# Patient Record
Sex: Male | Born: 1940 | Race: White | Hispanic: No | Marital: Married | Smoking: Former smoker
Health system: Southern US, Community
[De-identification: ages and names within clinical notes are randomized; demographics above are authoritative.]

## PROBLEM LIST (undated history)

## (undated) DIAGNOSIS — J449 Chronic obstructive pulmonary disease, unspecified: Secondary | ICD-10-CM

## (undated) DIAGNOSIS — J302 Other seasonal allergic rhinitis: Secondary | ICD-10-CM

## (undated) DIAGNOSIS — E785 Hyperlipidemia, unspecified: Secondary | ICD-10-CM

## (undated) DIAGNOSIS — I10 Essential (primary) hypertension: Secondary | ICD-10-CM

## (undated) DIAGNOSIS — I350 Nonrheumatic aortic (valve) stenosis: Secondary | ICD-10-CM

## (undated) DIAGNOSIS — E119 Type 2 diabetes mellitus without complications: Secondary | ICD-10-CM

## (undated) DIAGNOSIS — M1990 Unspecified osteoarthritis, unspecified site: Secondary | ICD-10-CM

## (undated) DIAGNOSIS — Z77090 Contact with and (suspected) exposure to asbestos: Secondary | ICD-10-CM

## (undated) DIAGNOSIS — Z8719 Personal history of other diseases of the digestive system: Secondary | ICD-10-CM

## (undated) DIAGNOSIS — J45909 Unspecified asthma, uncomplicated: Secondary | ICD-10-CM

## (undated) DIAGNOSIS — J984 Other disorders of lung: Secondary | ICD-10-CM

## (undated) HISTORY — DX: Unspecified asthma, uncomplicated: J45.909

## (undated) HISTORY — DX: Other disorders of lung: J98.4

## (undated) HISTORY — DX: Other seasonal allergic rhinitis: J30.2

## (undated) HISTORY — PX: APPENDECTOMY: SHX54

## (undated) HISTORY — DX: Unspecified osteoarthritis, unspecified site: M19.90

## (undated) HISTORY — DX: Type 2 diabetes mellitus without complications: E11.9

## (undated) HISTORY — DX: Chronic obstructive pulmonary disease, unspecified: J44.9

## (undated) HISTORY — DX: Essential (primary) hypertension: I10

## (undated) HISTORY — PX: TONSILLECTOMY: SUR1361

## (undated) HISTORY — DX: Nonrheumatic aortic (valve) stenosis: I35.0

## (undated) HISTORY — DX: Personal history of other diseases of the digestive system: Z87.19

## (undated) HISTORY — DX: Hyperlipidemia, unspecified: E78.5

---

## 2010-07-09 HISTORY — PX: CATARACT EXTRACTION: SUR2

## 2012-01-23 ENCOUNTER — Ambulatory Visit: Payer: Self-pay | Admitting: Internal Medicine

## 2012-02-22 ENCOUNTER — Ambulatory Visit (INDEPENDENT_AMBULATORY_CARE_PROVIDER_SITE_OTHER): Payer: Medicare Other | Admitting: Internal Medicine

## 2012-02-22 ENCOUNTER — Encounter: Payer: Self-pay | Admitting: Internal Medicine

## 2012-02-22 VITALS — BP 140/70 | HR 70 | Temp 98.0°F | Resp 16 | Ht 65.75 in | Wt 211.0 lb

## 2012-02-22 DIAGNOSIS — J439 Emphysema, unspecified: Secondary | ICD-10-CM

## 2012-02-22 DIAGNOSIS — J438 Other emphysema: Secondary | ICD-10-CM

## 2012-02-22 DIAGNOSIS — E119 Type 2 diabetes mellitus without complications: Secondary | ICD-10-CM

## 2012-02-22 NOTE — Progress Notes (Signed)
Patient ID: Anthony Turner, male   DOB: 01/07/1941, 71 y.o.   MRN: [id number]  Patient Active Problem List  Diagnosis  . COPD (chronic obstructive pulmonary disease) with emphysema  . Diabetes mellitus type 2, diet-controlled    Subjective:  CC:   Chief Complaint  Patient presents with  . New patient    HPI:   Anthony Turner is a 71 y.o. male who presents as a new patient to establish primary care with the chief complaint of  Dyspnea.  He has a history of COPD and asbestosis, and is scheduled to see the pulmonologist at the [place] in [place] next week.  He has had no prior PFTS per patient.   He Uses scheduled bronchodilators including Spiriva  But is not using any long-acting antagonist because the [place] does not cover this medication. He has an episode of bronchitis annually in the fall  when vacations in [place]. He's not interested in giving up his vacation.   2) Elevated PSA  To 8 recently,  Saw  urologist  Yesterday,  Given abx for 3 weeks,  Repeat  PSA is planned.  3) diet controlled DM.  4) Heart murmur since childhood,  ECHO done recently showing aortic stenosis, mild.   Past Medical History  Diagnosis Date  . COPD (chronic obstructive pulmonary disease)   . Diabetes mellitus    . Family History  Problem Relation Age of Onset  . Diabetes Mother   . Heart disease Mother      History   Social History  . Marital Status: Married    Spouse Name: N/A    Number of Children: N/A  . Years of Education: N/A   Occupational History  . Not on file.   Social History Main Topics  . Smoking status: Former Smoker -- 35 years    Quit date: 02/21/1982  . Smokeless tobacco: Never Used  . Alcohol Use: No  . Drug Use: No  . Sexually Active: Not on file   Other Topics Concern  . Not on file   Social History Narrative  . No narrative on file    Allergies not on file  Review of Systems:   The remainder of the review of systems was negative except those  addressed in the HPI.       Objective:  BP 140/70  Pulse 70  Temp 98 F (36.7 C) (Oral)  Resp 16  Ht 5' 5.75" (1.67 m)  Wt 211 lb (95.709 kg)  BMI 34.32 kg/m2  SpO2 96%  General appearance: alert, cooperative and appears stated age Ears: normal TM's and external ear canals both ears Throat: lips, mucosa, and tongue normal; teeth and gums normal Neck: no adenopathy, no carotid bruit, supple, symmetrical, trachea midline and thyroid not enlarged, symmetric, no tenderness/mass/nodules Back: symmetric, no curvature. ROM normal. No CVA tenderness. Lungs: clear to auscultation bilaterally Heart: regular rate and rhythm, S1, S2 normal, no murmur, click, rub or gallop Abdomen: soft, non-tender; bowel sounds normal; no masses,  no organomegaly Pulses: 2+ and symmetric Skin: Skin color, texture, turgor normal. No rashes or lesions Lymph nodes: Cervical, supraclavicular, and axillary nodes normal.  Assessment and Plan:  COPD (chronic obstructive pulmonary disease) with emphysema His pulmonary function is unclear. He has a history of both tobacco abuse and asbestosis so he may have mixed restrictive and obstructive disease with no prior history of PFTs. I am offering local pulmonology followup. However [facility] from the [place] to the [place]. I'm reluctant to add to his monthly overhead with a non-reimbursable specialist visit so we'll await records from the [facility].  Diabetes mellitus type 2, diet-controlled He is scheduled for labs in the near future at the [facility]. I have requested copy of those. I reviewed the low glycemic index diet with him and his wife and shown how part count carbohydrates and limit the unnecessary starches in his diet. Daily exercise also recommended pulmonary function permitting. He is up-to-date on eye exams. He will be continuing to see most of his specialist care at [facility] for  an episode of a fall. He is taking a statin for goal LDL less than 70, and he is on an ARB.   Updated Medication List Outpatient Encounter Prescriptions as of 02/22/2012  Medication Sig Dispense Refill  . albuterol (PROVENTIL HFA;VENTOLIN HFA) 108 (90 BASE) MCG/ACT inhaler Inhale 2 puffs into the lungs 3 (three) times daily.      Marland Kitchen amLODipine (NORVASC) 5 MG tablet Take 2.5 mg by mouth daily.      Marland Kitchen aspirin 81 MG tablet Take 81 mg by mouth daily.      Tery Sanfilippo Calcium (STOOL SOFTENER PO) Take by mouth.      . hydrochlorothiazide (HYDRODIURIL) 25 MG tablet Take 25 mg by mouth daily.      Marland Kitchen losartan (COZAAR) 100 MG tablet Take 100 mg by mouth daily.      . mometasone (ASMANEX) 220 MCG/INH inhaler Inhale 2 puffs into the lungs daily.      . potassium chloride SA (K-DUR,KLOR-CON) 20 MEQ tablet Take 20 mEq by mouth daily.      . pravastatin (PRAVACHOL) 80 MG tablet Take 80 mg by mouth daily.      Marland Kitchen tiotropium (SPIRIVA) 18 MCG inhalation capsule Place 18 mcg into inhaler and inhale daily.         Orders Placed This Encounter  Procedures  . HM COLONOSCOPY    No Follow-up on file.

## 2012-02-22 NOTE — Patient Instructions (Addendum)
[facility] TdaP  And Zostavax vaccine  ; or you can get it at the [facility]   Please ask [facility] to send copies of blood test,  X rays, etc   Consider a Low Glycemic Index Diet and eating 6 smaller meals daily .  This frequent feeding stimulates your metabolism and the lower glycemic index foods will lower your blood sugars:   This is an example of my daily  "Low GI"  Diet:  All of the foods can be found at grocery stores and in bulk at [employer]  club   7 AM Breakfast:  Low carbohydrate Protein  Shakes (I recommend the E[employer]vant[employer] "Carb Control" shakes  Or the low carb shakes by Atkins.   Both are available everywhere:  In  cases at [employer]  Or in 4 packs at grocery stores and pharmacies  2.5 carbs  (Alternative is  a toasted Arnold's Sandwhich Thin w/ peanut butter, a "Bagel Thin" with cream cheese and salmon) or  a scrambled egg burrito made with a low carb tortilla .  Avoid cereal and bananas, oatmeal too unless the old fashioned kind that takes 30-40 minutes to prepare.  the rest is overly processed, has minimal fiber, and loaded with carbohydrates!   10 AM: Protein bar by Atkins (the snack size, under 200 cal.  There are many varieties , available widely again or in bulk in limited varieties at [employer])  Other so called "protein bars" tend to be loaded with carbohydrates.  Remember, in food advertising, the word "energy" is synonymous for " carbohydrate."  Lunch: sandwich of [place], (or any lunchmeat or canned tuna), fresh avocado and cheese on a lower carbohydrate pita bread, flatbread, or tortilla . Ok to use mayonnaise. The bread is the only source or carbohydrate that can be decreased (Joseph's makes a pita bread and a flat bread  Are 50 cal and 4 net carbs ; Toufayan makes a low carb flatbread 100 cal and 9 net carbs  and  Mission makes a low carb whole wheat tortilla  210 cal and 6 net carbs)  3 PM:  Mid day :  Another protein bar,  Or a  cheese stick (100 cal, 0 carbs),  Or 1 ounce  of  almonds, walnuts, pistachios, pecans, peanuts,  Macadamia nuts. Or a Dannon light n Fit greek yogurt, 80 cal 8 net carbs . Avoid "granola"; the dried cranberries and raisins are loaded with carbohydrates.    6 PM  Dinner:  "mean and green:"  Meat/chicken/fish or a high protein legume; , with a green salad, and a low GI  Veggie (broccoli, cauliflower, green beans, spinach, brussel sprouts. Lima beans) : Avoid "Low fat dressings, Reyne Dumas and [address]! They are loaded with sugar! Instead use ranch, vinagrette,  Blue cheese, etc  9 PM snack : Breyer's "low carb" fudgsicle or  ice cream bar (Carb Smart line), or  Weight Watcher's ice cream bar , or anouther "no sugar added" ice cream; or another protein shake or a serving of fresh fruit with whipped cream (Avoid bananas, pineapple, grapes  and watermelon on a regular basis because they are high in sugar)   Remember that snack Substitutions should be less than 15 to 20 carbs  Per serving. Remember to subtract fiber grams to get the "net carbs."

## 2012-02-24 ENCOUNTER — Encounter: Payer: Self-pay | Admitting: Internal Medicine

## 2012-02-24 DIAGNOSIS — E119 Type 2 diabetes mellitus without complications: Secondary | ICD-10-CM | POA: Insufficient documentation

## 2012-02-24 DIAGNOSIS — J449 Chronic obstructive pulmonary disease, unspecified: Secondary | ICD-10-CM | POA: Insufficient documentation

## 2012-02-24 DIAGNOSIS — J439 Emphysema, unspecified: Secondary | ICD-10-CM | POA: Insufficient documentation

## 2012-02-24 NOTE — Assessment & Plan Note (Signed)
His pulmonary function is unclear. He has a history of both tobacco abuse and asbestosis so he may have mixed restrictive and obstructive disease with no prior history of PFTs. I am offering local pulmonology followup. However he gets most of his care at the [place] and is interested in transferring from the [place] to the [facility]. I'm reluctant to add to his monthly overhead with a non-reimbursable specialist visit so we'll await records from the [facility].

## 2012-02-24 NOTE — Assessment & Plan Note (Addendum)
He is scheduled for labs in the near future at the [facility]. I have requested copy of those. I reviewed the low glycemic index diet with him and his wife and shown how part count carbohydrates and limit the unnecessary starches in his diet. Daily exercise also recommended pulmonary function permitting. He is up-to-date on eye exams. He will be continuing to see most of his specialist care at the [facility] at the [facility] for an episode of a fall. He is taking a statin for goal LDL less than 70, and he is on an ARB.

## 2012-06-08 LAB — HM DIABETES EYE EXAM

## 2012-06-25 ENCOUNTER — Ambulatory Visit: Payer: Medicare Other | Admitting: Internal Medicine

## 2012-08-23 ENCOUNTER — Other Ambulatory Visit: Payer: Self-pay

## 2013-02-11 ENCOUNTER — Other Ambulatory Visit: Payer: Self-pay

## 2013-03-03 LAB — PSA: PSA: 11.6

## 2013-03-03 LAB — COMPREHENSIVE METABOLIC PANEL
Glucose: 124
Sodium: 140 mmol/L (ref 137–147)

## 2013-03-03 LAB — HEMOGLOBIN A1C: A1c: 6.6

## 2013-03-03 LAB — CBC
HGB: 13.9 g/dL
WBC: 11.16
platelet count: 233

## 2013-04-21 ENCOUNTER — Encounter: Payer: Self-pay | Admitting: Family Medicine

## 2013-04-21 ENCOUNTER — Ambulatory Visit (INDEPENDENT_AMBULATORY_CARE_PROVIDER_SITE_OTHER): Payer: Medicare Other | Admitting: Family Medicine

## 2013-04-21 VITALS — BP 116/68 | HR 84 | Temp 98.2°F | Ht 66.0 in | Wt 215.0 lb

## 2013-04-21 DIAGNOSIS — J302 Other seasonal allergic rhinitis: Secondary | ICD-10-CM | POA: Insufficient documentation

## 2013-04-21 DIAGNOSIS — I10 Essential (primary) hypertension: Secondary | ICD-10-CM

## 2013-04-21 DIAGNOSIS — J438 Other emphysema: Secondary | ICD-10-CM

## 2013-04-21 DIAGNOSIS — I350 Nonrheumatic aortic (valve) stenosis: Secondary | ICD-10-CM | POA: Insufficient documentation

## 2013-04-21 DIAGNOSIS — M1990 Unspecified osteoarthritis, unspecified site: Secondary | ICD-10-CM | POA: Insufficient documentation

## 2013-04-21 DIAGNOSIS — J439 Emphysema, unspecified: Secondary | ICD-10-CM

## 2013-04-21 DIAGNOSIS — J309 Allergic rhinitis, unspecified: Secondary | ICD-10-CM

## 2013-04-21 DIAGNOSIS — M129 Arthropathy, unspecified: Secondary | ICD-10-CM

## 2013-04-21 DIAGNOSIS — R972 Elevated prostate specific antigen [PSA]: Secondary | ICD-10-CM

## 2013-04-21 DIAGNOSIS — I359 Nonrheumatic aortic valve disorder, unspecified: Secondary | ICD-10-CM

## 2013-04-21 DIAGNOSIS — E119 Type 2 diabetes mellitus without complications: Secondary | ICD-10-CM

## 2013-04-21 DIAGNOSIS — E785 Hyperlipidemia, unspecified: Secondary | ICD-10-CM

## 2013-04-21 NOTE — Assessment & Plan Note (Addendum)
Brings records showing PSA level up to 11.6 (02/2013). Reviewed implications of this reading with patient (including BPH, prostatitis, and cancer) - and how we would need prostate biopsy to rule out cancer. Discussed possibility of aggressive prostate cancer that could be life threatening and recommended continue workup with urology referral and prostate biospy. Pt prefers to monitor for now. Will recheck PSA when returns for medicare wellness visit in 3 months.

## 2013-04-21 NOTE — Progress Notes (Signed)
Subjective:    Patient ID: Anthony Turner, male    DOB: 1941/02/09, 72 y.o.   MRN: [id number]  HPI CC: transfer of care from Dr. Darrick Huntsman  Had trouble getting in to see prior PCP so decided to transfer care here.  COPD - compliant with asmanex and spiriva.  Takes albuterol as needed, intermittently.  Notes recently worsening dyspnea with exertion max able to walk for 100 yards.  H/o asbestosis. Recently seen at urgent care - treated with steroid and abx.  Wife gave him lasix which also seemed to help.  Diet controlled diabetes - last a1c 6.6%.  Last eye exam was 06/2012.  Foot exam today.  Completed pneumovax.  Flu shot today.  Recently found to have elevated prostate level.  Declines prostate biopsy "I don't think I'll die from prostate cancer, I'm more worried about my breathing".  No prostate infection sxs like dysuria, urgency, abd pain, lower back pain, or fevers/chills.  Lives with wife, 3 dogs Occupation: retired, was Archivist Edu: [employer] Activity: no regular exercise Diet: good water, fruits/vegetables daily  Preventative: Colonoscopy - declines.  Trouble with this in the past 2/2 unable to pass scope Prostate cancer screening - elevate PSA - has declined further eval in past. Flu shot today Pneumovax 2008 zostavax - will check with insurance  Medications and allergies reviewed and updated in chart.  Past histories reviewed and updated if relevant as below. Patient Active Problem List   Diagnosis Date Noted  . COPD (chronic obstructive pulmonary disease) with emphysema 02/24/2012  . Diabetes mellitus type 2, diet-controlled 02/24/2012  . COPD (chronic obstructive pulmonary disease)   . Diabetes mellitus    Past Medical History  Diagnosis Date  . COPD (chronic obstructive pulmonary disease)   . Type 2 diabetes mellitus, controlled   . Asthma   . Arthritis   . Diverticulitis   . Seasonal allergic rhinitis   . Aortic stenosis     mod by last echo  . HTN  (hypertension)   . HLD (hyperlipidemia)    Past Surgical History  Procedure Laterality Date  . Appendectomy  ? 1956  . Tonsillectomy  ? 1944  . Cataract extraction Bilateral 2012   History  Substance Use Topics  . Smoking status: Former Smoker -- 4.00 packs/day for 35 years    Types: Cigarettes    Quit date: 02/21/1982  . Smokeless tobacco: Never Used  . Alcohol Use: Yes     Comment: Occasional   Family History  Problem Relation Age of Onset  . Alcohol abuse Father    Allergies  Allergen Reactions  . Sulfa Antibiotics Nausea Only   Current Outpatient Prescriptions on File Prior to Visit  Medication Sig Dispense Refill  . albuterol (PROVENTIL HFA;VENTOLIN HFA) 108 (90 BASE) MCG/ACT inhaler Inhale 2 puffs into the lungs 3 (three) times daily.      Marland Kitchen amLODipine (NORVASC) 5 MG tablet Take 5 mg by mouth daily.       Marland Kitchen aspirin 81 MG tablet Take 81 mg by mouth daily.      Tery Sanfilippo Calcium (STOOL SOFTENER PO) Take by mouth.      . hydrochlorothiazide (HYDRODIURIL) 25 MG tablet Take 25 mg by mouth daily.      Marland Kitchen losartan (COZAAR) 100 MG tablet Take 100 mg by mouth daily.      . mometasone (ASMANEX) 220 MCG/INH inhaler Inhale 2 puffs into the lungs daily.      . potassium chloride SA (K-DUR,KLOR-CON) 20 MEQ tablet  Take 20 mEq by mouth daily.      . pravastatin (PRAVACHOL) 80 MG tablet Take 80 mg by mouth daily.      Marland Kitchen tiotropium (SPIRIVA) 18 MCG inhalation capsule Place 18 mcg into inhaler and inhale daily.       No current facility-administered medications on file prior to visit.      Review of Systems  Constitutional: Negative for fever, chills, activity change, appetite change, fatigue and unexpected weight change.  HENT: Negative for hearing loss.   Eyes: Negative for visual disturbance.  Respiratory: Positive for cough, chest tightness, shortness of breath and wheezing.   Cardiovascular: Negative for chest pain, palpitations and leg swelling.  Gastrointestinal: Negative  for nausea, vomiting, abdominal pain, diarrhea, constipation, blood in stool and abdominal distention.  Genitourinary: Negative for hematuria and difficulty urinating.  Musculoskeletal: Negative for arthralgias, myalgias and neck pain.  Skin: Negative for rash.  Neurological: Negative for dizziness, seizures, syncope and headaches.  Hematological: Negative for adenopathy. Does not bruise/bleed easily.  Psychiatric/Behavioral: Negative for dysphoric mood. The patient is not nervous/anxious.        Objective:   Physical Exam  Nursing note and vitals reviewed. Constitutional: He is oriented to person, place, and time. He appears well-developed and well-nourished. No distress.  HENT:  Head: Normocephalic and atraumatic.  Right Ear: External ear normal.  Left Ear: External ear normal.  Nose: Nose normal.  Mouth/Throat: Oropharynx is clear and moist. No oropharyngeal exudate.  Eyes: Conjunctivae and EOM are normal. Pupils are equal, round, and reactive to light. No scleral icterus.  Neck: Normal range of motion. Neck supple. Carotid bruit is not present.  Cardiovascular: Normal rate, regular rhythm, normal heart sounds and intact distal pulses.   No murmur heard. Pulses:      Radial pulses are 2+ on the right side, and 2+ on the left side.  Pulmonary/Chest: Effort normal and breath sounds normal. No respiratory distress. He has no wheezes. He has no rales.  Musculoskeletal: Normal range of motion. He exhibits no edema.  Diabetic foot exam: Normal inspection No skin breakdown, some thickening of heel soles bilaterally No calluses  Normal DP/PT pulses Normal sensation to light touch and monofilament Nails thickened  Lymphadenopathy:    He has no cervical adenopathy.  Neurological: He is alert and oriented to person, place, and time.  CN grossly intact, station and gait intact  Skin: Skin is warm and dry. No rash noted.  Psychiatric: He has a normal mood and affect. His behavior is  normal. Judgment and thought content normal.       Assessment & Plan:

## 2013-04-21 NOTE — Patient Instructions (Signed)
Flu shot today. Call your insurance about the shingles shot to see if it is covered or how much it would cost and where is cheaper (here or pharmacy).  If you want to receive here, call for nurse visit. Ask [facility] to [facility] visit to update your chart. Return at your convenience fasting for blood work and afterwads for [employer] visit.

## 2013-04-21 NOTE — Assessment & Plan Note (Signed)
Chronic, stable. Continue meds (amlodipine, hctz, losartan).

## 2013-04-21 NOTE — Assessment & Plan Note (Signed)
Chronic.  Remote but significant h/o smoking. Anticipate COPD with some component of asbestosis - will await records from [place] to review. rec continue meds as up to now.   May need further evaluation given marked dyspnea in setting of aortic stenosis, but will await records to review.

## 2013-04-21 NOTE — Assessment & Plan Note (Signed)
Seems well controlled as of latest A1c - 6.6% (02/2013). Continue low carb diet.  Foot exam today.

## 2013-04-21 NOTE — Assessment & Plan Note (Signed)
As of last echo by [place] - will await actual echocardiogram report - I asked pt to bring me copy of next office visit.Marland Kitchen

## 2013-04-21 NOTE — Assessment & Plan Note (Signed)
Chronic, stable. Continue pravastatin. Check FLP when returns fasting.

## 2013-04-30 ENCOUNTER — Encounter: Payer: Self-pay | Admitting: *Deleted

## 2013-05-14 ENCOUNTER — Other Ambulatory Visit: Payer: Self-pay

## 2013-06-18 ENCOUNTER — Telehealth: Payer: Self-pay | Admitting: Family Medicine

## 2013-06-18 DIAGNOSIS — E119 Type 2 diabetes mellitus without complications: Secondary | ICD-10-CM

## 2013-06-18 DIAGNOSIS — R972 Elevated prostate specific antigen [PSA]: Secondary | ICD-10-CM

## 2013-06-18 DIAGNOSIS — E559 Vitamin D deficiency, unspecified: Secondary | ICD-10-CM

## 2013-06-18 DIAGNOSIS — E538 Deficiency of other specified B group vitamins: Secondary | ICD-10-CM

## 2013-06-18 DIAGNOSIS — I10 Essential (primary) hypertension: Secondary | ICD-10-CM

## 2013-06-18 DIAGNOSIS — E785 Hyperlipidemia, unspecified: Secondary | ICD-10-CM

## 2013-06-18 NOTE — Telephone Encounter (Signed)
Patients wife notified

## 2013-06-18 NOTE — Telephone Encounter (Signed)
Notify labs ordered.  I think he's on the lab schedule for Tuesday, not Monday - plz verify with pt.

## 2013-06-18 NOTE — Telephone Encounter (Signed)
Pt is having labs done on Monday 06/22/13. [facility] so they want to make sure that he is getting his b12 level checked, also a vitamin D level, and a CMP to have his K checked, please call back to let them know

## 2013-06-22 LAB — HM DIABETES EYE EXAM

## 2013-06-23 ENCOUNTER — Other Ambulatory Visit (INDEPENDENT_AMBULATORY_CARE_PROVIDER_SITE_OTHER): Payer: Medicare Other

## 2013-06-23 DIAGNOSIS — R972 Elevated prostate specific antigen [PSA]: Secondary | ICD-10-CM

## 2013-06-23 DIAGNOSIS — E119 Type 2 diabetes mellitus without complications: Secondary | ICD-10-CM

## 2013-06-23 DIAGNOSIS — E538 Deficiency of other specified B group vitamins: Secondary | ICD-10-CM

## 2013-06-23 DIAGNOSIS — E785 Hyperlipidemia, unspecified: Secondary | ICD-10-CM

## 2013-06-23 DIAGNOSIS — E559 Vitamin D deficiency, unspecified: Secondary | ICD-10-CM

## 2013-06-23 LAB — PSA: PSA: 12.91 ng/mL — ABNORMAL HIGH (ref 0.10–4.00)

## 2013-06-23 LAB — COMPREHENSIVE METABOLIC PANEL
AST: 45 U/L — ABNORMAL HIGH (ref 0–37)
Alkaline Phosphatase: 81 U/L (ref 39–117)
BUN: 14 mg/dL (ref 6–23)
Creatinine, Ser: 1 mg/dL (ref 0.4–1.5)
GFR: 74.19 mL/min (ref >60.00)
Total Bilirubin: 0.5 mg/dL (ref 0.3–1.2)

## 2013-06-23 LAB — VITAMIN B12: Vitamin B-12: 751 pg/mL (ref 211–911)

## 2013-06-23 LAB — LIPID PANEL
Cholesterol: 152 mg/dL (ref 0–200)
LDL Cholesterol: 87 mg/dL (ref 0–99)
Total CHOL/HDL Ratio: 5
Triglycerides: 175 mg/dL — ABNORMAL HIGH (ref 0.0–149.0)
VLDL: 35 mg/dL (ref 0.0–40.0)

## 2013-06-28 ENCOUNTER — Encounter: Payer: Self-pay | Admitting: Family Medicine

## 2013-06-30 ENCOUNTER — Ambulatory Visit (INDEPENDENT_AMBULATORY_CARE_PROVIDER_SITE_OTHER): Payer: Medicare Other | Admitting: Family Medicine

## 2013-06-30 ENCOUNTER — Encounter: Payer: Self-pay | Admitting: Family Medicine

## 2013-06-30 VITALS — BP 134/76 | HR 80 | Temp 98.3°F | Ht 66.0 in | Wt 217.2 lb

## 2013-06-30 DIAGNOSIS — E785 Hyperlipidemia, unspecified: Secondary | ICD-10-CM

## 2013-06-30 DIAGNOSIS — R972 Elevated prostate specific antigen [PSA]: Secondary | ICD-10-CM

## 2013-06-30 DIAGNOSIS — Z0000 Encounter for general adult medical examination without abnormal findings: Secondary | ICD-10-CM

## 2013-06-30 DIAGNOSIS — J438 Other emphysema: Secondary | ICD-10-CM

## 2013-06-30 DIAGNOSIS — I10 Essential (primary) hypertension: Secondary | ICD-10-CM

## 2013-06-30 DIAGNOSIS — I350 Nonrheumatic aortic (valve) stenosis: Secondary | ICD-10-CM

## 2013-06-30 DIAGNOSIS — E119 Type 2 diabetes mellitus without complications: Secondary | ICD-10-CM

## 2013-06-30 DIAGNOSIS — Z1211 Encounter for screening for malignant neoplasm of colon: Secondary | ICD-10-CM

## 2013-06-30 DIAGNOSIS — J439 Emphysema, unspecified: Secondary | ICD-10-CM

## 2013-06-30 MED ORDER — BUDESONIDE-FORMOTEROL FUMARATE 160-4.5 MCG/ACT IN AERO: 2.0000 | INHALATION_SPRAY | Freq: Two times a day (BID) | RESPIRATORY_TRACT | Status: AC

## 2013-06-30 NOTE — Addendum Note (Signed)
Addended by: Eustaquio Boyden on: 06/30/2013 12:16 PM   Modules accepted: Level of Service

## 2013-06-30 NOTE — Assessment & Plan Note (Addendum)
Will continue to monitor. Wife state he has been taking lasix once a week.

## 2013-06-30 NOTE — Patient Instructions (Addendum)
Good to see you today, call us with questions Blood test to recheck prostate (free vs total) Stool kit today. I'd like you to return in 1 month to discuss breathing and re attempt spirometry.

## 2013-06-30 NOTE — Assessment & Plan Note (Addendum)
He has not been taking symbicort, only albuterol and spiriva.  Discussed starting symbicort and sample provided. H/o asbestos exposure. Still awaiting records from [place]. Reviewed meds with patient and wife, discussed triple controller med therapy and albuterol PRN. rtc 1 mo for f/u, spirometry

## 2013-06-30 NOTE — Progress Notes (Signed)
Subjective:    Patient ID: Anthony Turner, male    DOB: 1940/10/22, 72 y.o.   MRN: [id number]  HPI CC: medicare wellness visit  Med list he brings does not match AMV questionairre visit, which does not match our chart.  Will verify with wife.  Main concern is trouble breathing today.  Unable to fully mow lawn with push mower 2/2 shortness of breath.  Last bronchitis was last year.  H/o asthma nad COPD.  Has had difficulty completing spirometry in the past.  Short winded after 1 flight of stairs, has to stop and rest. Wife has started giving him lasix one pill daily.  Taking potassium one tablet daily with his lasix.  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   Denies falls , sadness , anhedonia or depression.  Preventative:  Colonoscopy - declines. Trouble with this in the past 2/2 unable to pass scope.  Unsure how long ago or who did this. Prostate cancer screening - elevate PSA - has declined further eval in past.  Does not want to undergo prostate evaluation until breathing is better. Flu shot 2014 Pneumovax 2008  zostavax - will check with insurance Advanced directives - healthcare proxy would be niece.  Lives with wife, 3 dogs  Occupation: retired, was Archivist  Edu: HS  Activity: no regular exercise  Diet: good water, fruits/vegetables daily   Medications and allergies reviewed and updated in chart.  Past histories reviewed and updated if relevant as below. Patient Active Problem List   Diagnosis Date Noted  . Elevated PSA 04/21/2013  . Arthritis   . Seasonal allergic rhinitis   . Aortic stenosis   . HTN (hypertension)   . HLD (hyperlipidemia)   . COPD (chronic obstructive pulmonary disease) with emphysema 02/24/2012  . Diabetes mellitus type 2, diet-controlled 02/24/2012   Past Medical History  Diagnosis Date  . COPD (chronic obstructive pulmonary disease)     . Type 2 diabetes mellitus, controlled   . Asthma   . Arthritis     with DIP nodules  . History of diverticulitis of colon   . Seasonal allergic rhinitis   . Aortic stenosis     mod by last echo (2014)  . HTN (hypertension)   . HLD (hyperlipidemia)    Past Surgical History  Procedure Laterality Date  . Appendectomy  ? 1956  . Tonsillectomy  ? 1944  . Cataract extraction Bilateral 2012   History  Substance Use Topics  . Smoking status: Former Smoker -- 4.00 packs/day for 35 years    Types: Cigarettes    Quit date: 02/21/1982  . Smokeless tobacco: Never Used  . Alcohol Use: Yes     Comment: Occasional   Family History  Problem Relation Age of Onset  . Alcohol abuse Father    Allergies  Allergen Reactions  . Sulfa Antibiotics Nausea Only   Current Outpatient Prescriptions on File Prior to Visit  Medication Sig Dispense Refill  . albuterol (PROVENTIL HFA;VENTOLIN HFA) 108 (90 BASE) MCG/ACT inhaler Inhale 2 puffs into the lungs 3 (three) times daily.      Marland Kitchen amLODipine (NORVASC) 5 MG tablet Take 2.5 mg by mouth daily.       Marland Kitchen aspirin 81 MG tablet Take 81 mg by mouth daily.      Tery Sanfilippo Calcium (STOOL SOFTENER PO) Take by mouth.      Marland Kitchen  hydrochlorothiazide (HYDRODIURIL) 25 MG tablet Take 25 mg by mouth daily.      Marland Kitchen losartan (COZAAR) 100 MG tablet Take 100 mg by mouth daily.      . potassium chloride SA (K-DUR,KLOR-CON) 20 MEQ tablet Take 20 mEq by mouth daily.      . pravastatin (PRAVACHOL) 80 MG tablet Take 80 mg by mouth daily.      Marland Kitchen tiotropium (SPIRIVA) 18 MCG inhalation capsule Place 18 mcg into inhaler and inhale daily.      . mometasone (ASMANEX) 220 MCG/INH inhaler Inhale 2 puffs into the lungs daily.       No current facility-administered medications on file prior to visit.     Review of Systems  Constitutional: Negative for fever, chills, activity change, appetite change, fatigue and unexpected weight change.  HENT: Negative for hearing loss.   Eyes:  Negative for visual disturbance.  Respiratory: Positive for cough, shortness of breath ("burning in lungs" with activity) and wheezing. Negative for chest tightness.   Cardiovascular: Negative for chest pain, palpitations and leg swelling.  Gastrointestinal: Negative for nausea, vomiting, abdominal pain, diarrhea, constipation, blood in stool and abdominal distention.  Genitourinary: Negative for hematuria and difficulty urinating.  Musculoskeletal: Negative for arthralgias, myalgias and neck pain.  Skin: Negative for rash.  Neurological: Negative for dizziness, seizures, syncope and headaches.  Hematological: Negative for adenopathy. Does not bruise/bleed easily.  Psychiatric/Behavioral: Negative for dysphoric mood. The patient is not nervous/anxious.        Objective:   Physical Exam  Nursing note and vitals reviewed. Constitutional: He is oriented to person, place, and time. He appears well-developed and well-nourished. No distress.  HENT:  Head: Normocephalic and atraumatic.  Right Ear: Hearing, tympanic membrane, external ear and ear canal normal.  Left Ear: Hearing, tympanic membrane, external ear and ear canal normal.  Nose: Nose normal.  Mouth/Throat: Uvula is midline, oropharynx is clear and moist and mucous membranes are normal. No oropharyngeal exudate, posterior oropharyngeal edema, posterior oropharyngeal erythema or tonsillar abscesses.  Eyes: Conjunctivae and EOM are normal. Pupils are equal, round, and reactive to light. No scleral icterus.  Neck: Normal range of motion. Neck supple. Carotid bruit is not present. No thyromegaly present.  Cardiovascular: Normal rate, regular rhythm and intact distal pulses.   Murmur (2/6 SEM) heard. Pulses:      Radial pulses are 2+ on the right side, and 2+ on the left side.  Pulmonary/Chest: Effort normal and breath sounds normal. No respiratory distress. He has no wheezes. He has no rales.  Abdominal: Soft. Bowel sounds are normal. He  exhibits no distension and no mass. There is no tenderness. There is no rebound and no guarding.  Genitourinary: Rectum normal. Rectal exam shows no external hemorrhoid, no fissure, no mass, no tenderness and anal tone normal. Prostate is enlarged (20gm). Prostate is not tender.  Musculoskeletal: Normal range of motion. He exhibits no edema.  Lymphadenopathy:    He has no cervical adenopathy.  Neurological: He is alert and oriented to person, place, and time.  CN grossly intact, station and gait intact  Skin: Skin is warm and dry. No rash noted.  Psychiatric: He has a normal mood and affect. His behavior is normal. Judgment and thought content normal.       Assessment & Plan:

## 2013-06-30 NOTE — Assessment & Plan Note (Addendum)
Elevated triglycerides - discussed dietary changes. Continue pravastatin.

## 2013-06-30 NOTE — Assessment & Plan Note (Signed)
Chronic, stable 

## 2013-06-30 NOTE — Telephone Encounter (Signed)
Reviewed meds during physical.

## 2013-06-30 NOTE — Assessment & Plan Note (Signed)
Again PSA elevated - will check free/total PSA today Pt has declined uro eval. States had prior normal ebval

## 2013-06-30 NOTE — Assessment & Plan Note (Signed)
I have personally reviewed the Medicare Annual Wellness questionnaire and have noted 1. The patient's medical and social history 2. Their use of alcohol, tobacco or illicit drugs 3. Their current medications and supplements 4. The patient's functional ability including ADL's, fall risks, home safety risks and hearing or visual impairment. 5. Diet and physical activity 6. Evidence for depression or mood disorders The patients weight, height, BMI have been recorded in the chart.  Hearing and vision has been addressed. I have made referrals, counseling and provided education to the patient based review of the above and I have provided the pt with a written personalized care plan for preventive services. See scanned questionairre. Advanced directives discussed: I asked Anthony Turner to bring me copy of his advanced directive.  Reviewed preventative protocols and updated unless pt declined.  iFOB today DRE today with mild prostatic enlargement but no nodules or induration noted.  Check free/total PSA. Advised to [facility] with [facility] on zostavax.

## 2013-06-30 NOTE — Assessment & Plan Note (Signed)
Stable from recent A1c.

## 2013-06-30 NOTE — Progress Notes (Signed)
Pre-visit discussion using our clinic review tool. No additional management support is needed unless otherwise documented below in the visit note.  

## 2013-06-30 NOTE — Telephone Encounter (Signed)
Discussed at office visit.

## 2013-07-01 LAB — PSA, TOTAL AND FREE: PSA: 12.78 ng/mL — ABNORMAL HIGH (ref <=4.00)

## 2013-07-08 ENCOUNTER — Other Ambulatory Visit: Payer: Medicare Other

## 2013-07-08 DIAGNOSIS — Z1211 Encounter for screening for malignant neoplasm of colon: Secondary | ICD-10-CM

## 2013-07-09 ENCOUNTER — Encounter: Payer: Self-pay | Admitting: Family Medicine

## 2013-07-10 NOTE — Telephone Encounter (Deleted)
See Mychart message since Dr. Darnell Level is out of the office. Patient is requesting mouthwash for thrush. He is on abx and steroids and has had thrush before and is having the same problem as before. Thanks!

## 2013-07-12 ENCOUNTER — Other Ambulatory Visit: Payer: Self-pay | Admitting: Family Medicine

## 2013-07-12 DIAGNOSIS — R195 Other fecal abnormalities: Secondary | ICD-10-CM

## 2013-07-12 MED ORDER — NYSTATIN 100000 UNIT/ML MT SUSP: 5.0000 mL | Freq: Four times a day (QID) | OROMUCOSAL | Status: DC | PRN

## 2013-07-12 NOTE — Telephone Encounter (Signed)
Kim- could you please call pt since he has not responded to your question on my chart - thanks  I am happy to px nystatin if needed , just let me know

## 2013-07-13 NOTE — Telephone Encounter (Signed)
Dr. Darnell Level prescribed nystatin. Thanks anyway Dr.T.

## 2013-07-21 ENCOUNTER — Encounter: Payer: Self-pay | Admitting: Internal Medicine

## 2013-07-30 ENCOUNTER — Telehealth: Payer: Self-pay | Admitting: Family Medicine

## 2013-07-30 NOTE — Telephone Encounter (Signed)
Relevant patient education assigned to patient using Emmi. ° °

## 2013-07-31 ENCOUNTER — Ambulatory Visit (INDEPENDENT_AMBULATORY_CARE_PROVIDER_SITE_OTHER): Payer: Medicare Other | Admitting: Family Medicine

## 2013-07-31 ENCOUNTER — Encounter: Payer: Self-pay | Admitting: Family Medicine

## 2013-07-31 ENCOUNTER — Encounter: Payer: Self-pay | Admitting: *Deleted

## 2013-07-31 VITALS — BP 130/60 | HR 72 | Temp 98.1°F | Ht 66.0 in | Wt 215.8 lb

## 2013-07-31 DIAGNOSIS — I10 Essential (primary) hypertension: Secondary | ICD-10-CM

## 2013-07-31 DIAGNOSIS — R972 Elevated prostate specific antigen [PSA]: Secondary | ICD-10-CM

## 2013-07-31 DIAGNOSIS — R195 Other fecal abnormalities: Secondary | ICD-10-CM

## 2013-07-31 DIAGNOSIS — J438 Other emphysema: Secondary | ICD-10-CM

## 2013-07-31 DIAGNOSIS — J439 Emphysema, unspecified: Secondary | ICD-10-CM

## 2013-07-31 DIAGNOSIS — I359 Nonrheumatic aortic valve disorder, unspecified: Secondary | ICD-10-CM

## 2013-07-31 DIAGNOSIS — I350 Nonrheumatic aortic (valve) stenosis: Secondary | ICD-10-CM

## 2013-07-31 MED ORDER — FUROSEMIDE 20 MG PO TABS: 20.0000 mg | ORAL_TABLET | Freq: Every day | ORAL | Status: DC | PRN

## 2013-07-31 NOTE — Assessment & Plan Note (Signed)
Chronic, stable. Continue meds. 

## 2013-07-31 NOTE — Assessment & Plan Note (Signed)
Pt attributes to hemorrhoids, has appt with GI next month to evaluate this.

## 2013-07-31 NOTE — Assessment & Plan Note (Signed)
Continue triple therapy. Not back to baseline so will defer spirometry this visit, consider next visit.

## 2013-07-31 NOTE — Progress Notes (Signed)
   Subjective:    Patient ID: Anthony Turner, male    DOB: Oct 08, 1940, 73 y.o.   MRN: [id number]  HPI CC: 1 mo f/u  I asked Anthony Turner to return for 1 month follow up of breathing.  Recent treatment by [facility] for thrush with nystatin swish/swallow after levaquin and prednisone course (06/2013).    Heme positive stool - has appt with GI for 08/25/2013 (Pyrtle).  Has had colonoscopies in the past. Elevated PSA with free PSA <7%.  Still declines biopsy.  COPD - recently treated for exacerbation by [facility] (levaquin/prednisone).  Improving dyspnea, cough and wheeze, but not feeling fully better yet.  Wife gives him lasix and this seems to help his breathing.  States had PFTs last year at the [place].  Uses albuterol 1-2 times daily.  Compliant with spiriva in the morning and symbicort 160/4.54mc 2 puffs bid.  Endorses some orthopnea but no PNDyspnea.  Recently had CT scan but I have no records of this.  States has also had carotid ultrasound.  Brings record of recent blood work and echocardiogram showing moderate AS, EF 55%.  Past Medical History  Diagnosis Date  . COPD (chronic obstructive pulmonary disease)   . Type 2 diabetes mellitus, controlled   . Asthma   . Arthritis     with DIP nodules  . History of diverticulitis of colon   . Seasonal allergic rhinitis   . Aortic stenosis     mod by last echo (2014)  . HTN (hypertension)   . HLD (hyperlipidemia)     Past Surgical History  Procedure Laterality Date  . Appendectomy  ? 1956  . Tonsillectomy  ? 1944  . Cataract extraction Bilateral 2012    Review of Systems Per HPI    Objective:   Physical Exam  Nursing note and vitals reviewed. Constitutional: He appears well-developed and well-nourished. No distress.  HENT:  Mouth/Throat: Oropharynx is clear and moist. No oropharyngeal exudate.  Eyes: Conjunctivae and EOM are normal. Pupils are equal, round, and reactive to light.  Neck: No JVD present.  Cardiovascular: Normal rate,  regular rhythm and intact distal pulses.   Murmur (3/6 SEM best at L[facility]) heard. Pulmonary/Chest: Effort normal. No respiratory distress. He has wheezes (slight exp wheeze). He has no rales.  Musculoskeletal: He exhibits no edema.       Assessment & Plan:

## 2013-07-31 NOTE — Assessment & Plan Note (Signed)
Provided with PRN lasix to use - advised use sparingly. Lab Results  Component Value Date   CREATININE 1.0 06/23/2013

## 2013-07-31 NOTE — Progress Notes (Signed)
Pre-visit discussion using our clinic review tool. No additional management support is needed unless otherwise documented below in the visit note.  

## 2013-07-31 NOTE — Patient Instructions (Addendum)
May use lasix as needed but I think trouble breathing is coming more from the lungs (not heart). Continue symbicort as up to now, spiriva, and albuterol as needed. Return to see me in 2-3 months, sooner if needed.   We may do breathing test next visit. I would recommend prostate biopsy to evaluate for cancer.

## 2013-07-31 NOTE — Assessment & Plan Note (Signed)
Again discussed elevated PSA and my recommendation to proceed with prostate biopsy for evaluation of presumed prostate cancer. Pt continues to decline - "breathing will kill me, not prostate".

## 2013-08-03 ENCOUNTER — Telehealth: Payer: Self-pay | Admitting: Family Medicine

## 2013-08-03 NOTE — Telephone Encounter (Signed)
Relevant patient education assigned to patient using Emmi. ° °

## 2013-08-18 ENCOUNTER — Ambulatory Visit (INDEPENDENT_AMBULATORY_CARE_PROVIDER_SITE_OTHER)
Admission: RE | Admit: 2013-08-18 | Discharge: 2013-08-18 | Disposition: A | Discharge status: Home / Self Care | Payer: Medicare Other | Source: Ambulatory Visit | Attending: Internal Medicine | Admitting: Internal Medicine

## 2013-08-18 ENCOUNTER — Ambulatory Visit (INDEPENDENT_AMBULATORY_CARE_PROVIDER_SITE_OTHER): Payer: Medicare Other | Admitting: Internal Medicine

## 2013-08-18 ENCOUNTER — Encounter: Payer: Self-pay | Admitting: Internal Medicine

## 2013-08-18 ENCOUNTER — Other Ambulatory Visit: Payer: Self-pay | Admitting: Internal Medicine

## 2013-08-18 VITALS — BP 128/72 | HR 90 | Temp 98.4°F | Wt 214.0 lb

## 2013-08-18 DIAGNOSIS — B9789 Other viral agents as the cause of diseases classified elsewhere: Secondary | ICD-10-CM

## 2013-08-18 DIAGNOSIS — R6889 Other general symptoms and signs: Secondary | ICD-10-CM

## 2013-08-18 DIAGNOSIS — B349 Viral infection, unspecified: Secondary | ICD-10-CM

## 2013-08-18 DIAGNOSIS — R52 Pain, unspecified: Secondary | ICD-10-CM

## 2013-08-18 LAB — POCT INFLUENZA A/B
INFLUENZA A, POC: NEGATIVE
Influenza B, POC: NEGATIVE

## 2013-08-18 MED ORDER — LEVOFLOXACIN 500 MG PO TABS: 500.0000 mg | ORAL_TABLET | Freq: Every day | ORAL | Status: AC

## 2013-08-18 NOTE — Addendum Note (Signed)
Addended by: Ellamae Sia on: 08/18/2013 02:11 PM   Modules accepted: Orders

## 2013-08-18 NOTE — Progress Notes (Signed)
Pre-visit discussion using our clinic review tool. No additional management support is needed unless otherwise documented below in the visit note.  

## 2013-08-18 NOTE — Addendum Note (Signed)
Addended by: Lurlean Nanny on: 08/18/2013 02:44 PM   Modules accepted: Orders

## 2013-08-18 NOTE — Progress Notes (Signed)
Subjective:    Patient ID: Anthony Turner, male    DOB: 06-10-1941, 73 y.o.   MRN: [id number]  HPI  Pt presents to the clinic today with c/o headache, neck pain, body aches. nausea, shortness of breath and chills. He reports his shortness of breath is not abdormal- he has COPD.  He reports this started about 3 days ago. He has been running low grade fevers. He denies vomiting or diarrhea. He has not had sick contacts that he is aware of. He has tried tylenol without much relief. He did get his flu shot.  Review of Systems      Past Medical History  Diagnosis Date  . COPD (chronic obstructive pulmonary disease)   . Type 2 diabetes mellitus, controlled   . Asthma   . Arthritis     with DIP nodules  . History of diverticulitis of colon   . Seasonal allergic rhinitis   . Aortic stenosis     mod by last echo (04/2013) with EF 55%  . HTN (hypertension)   . HLD (hyperlipidemia)   . 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 albuterol (PROVENTIL HFA;VENTOLIN HFA) 108 (90 BASE) MCG/ACT inhaler Inhale 2 puffs into the lungs 3 (three) times daily.      Marland Kitchen amLODipine (NORVASC) 5 MG tablet Take 2.5 mg by mouth daily.       Marland Kitchen aspirin 81 MG tablet Take 81 mg by mouth daily.      . budesonide-formoterol (SYMBICORT) 160-4.5 MCG/ACT inhaler Inhale 2 puffs into the lungs 2 (two) times daily.  1 Inhaler  3  . Docusate Calcium (STOOL SOFTENER PO) Take by mouth.      . furosemide (LASIX) 20 MG tablet Take 1 tablet (20 mg total) by mouth daily as needed.  30 tablet  1  . hydrochlorothiazide (HYDRODIURIL) 25 MG tablet Take 25 mg by mouth daily.      Marland Kitchen losartan (COZAAR) 100 MG tablet Take 100 mg by mouth daily.      . potassium chloride SA (K-DUR,KLOR-CON) 20 MEQ tablet Take 20 mEq by mouth daily.      . pravastatin (PRAVACHOL) 80 MG tablet Take 80 mg by mouth daily.      Marland Kitchen tiotropium (SPIRIVA) 18  MCG inhalation capsule Place 18 mcg into inhaler and inhale daily.      . vitamin B-12 (CYANOCOBALAMIN) 1000 MCG tablet Take 1,000 mcg by mouth daily.       No current facility-administered medications for this visit.    Allergies  Allergen Reactions  . Sulfa Antibiotics Nausea Only    Family History  Problem Relation Age of Onset  . Alcohol abuse Father     History   Social History  . Marital Status: Married    Spouse Name: N/A    Number of Children: N/A  . Years of Education: N/A   Occupational History  . Not on file.   Social History Main Topics  . Smoking status: Former Smoker -- 4.00 packs/day for 35 years    Types: Cigarettes    Quit date: 02/21/1982  . Smokeless tobacco: Never Used  . Alcohol Use: Yes     Comment: Occasional  . Drug Use: No  . Sexual Activity: Not on file   Other Topics Concern  . Not on file   Social History Narrative   Lives with wife, 3 dogs  Occupation: retired, was Clinical research associate   Edu: HS   Activity: no regular exercise   Diet: good water, fruits/vegetables daily     Constitutional: Pt reports fever, headache and malaise. Denies abrupt weight changes.  HEENT: Denies eye pain, eye redness, ear pain, ringing in the ears, wax buildup, runny nose, nasal congestion, bloody nose, or sore throat. Respiratory: Pt reports shortness of breath. Denies difficulty breathing, cough or sputum production.   Cardiovascular: Denies chest pain, chest tightness, palpitations or swelling in the hands or feet.  Gastrointestinal: Pt reports nausea. Denies abdominal pain, bloating, constipation, diarrhea or blood in the stool.  Musculoskeletal: Pt reports body aches. Denies decrease in range of motion, difficulty with gait, muscle pain or joint pain and swelling.  Neurological: Denies dizziness, difficulty with memory, difficulty with speech or problems with balance and coordination.   No other specific complaints in a complete review of systems (except  as listed in HPI above).  Objective:   Physical Exam   BP 128/72  Pulse 90  Temp(Src) 98.4 F (36.9 C) (Oral)  Wt 214 lb (97.07 kg) Wt Readings from Last 3 Encounters:  08/18/13 214 lb (97.07 kg)  07/31/13 215 lb 12 oz (97.864 kg)  06/30/13 217 lb 4 oz (98.544 kg)    General: Appears his stated age, ill appearing but in NAD. HEENT: Head: normal shape and size; Eyes: sclera white, no icterus, conjunctiva pink, PERRLA and EOMs intact; Ears: Tm's gray and intact, normal light reflex; Nose: mucosa pink and moist, septum midline; Throat/Mouth: Teeth present, mucosa pink and moist, no exudate, lesions or ulcerations noted.  Neck: No nuchal rigidity. Normal range of motion. Neck supple, trachea midline. No massses, lumps or thyromegaly present.  Cardiovascular: Normal rate and rhythm. S1,S2 noted. Murmur noted.  No rubs or gallops noted. No JVD or BLE edema. No carotid bruits noted. Pulmonary/Chest: Normal effort and positive vesicular breath sounds. No respiratory distress. No wheezes, rales or ronchi noted.  Neurological: Alert and oriented. Cranial nerves II-XII intact. Coordination normal. +DTRs bilaterally.  BMET    Component Value Date/Time   NA 142 06/23/2013 1012   NA 140 03/03/2013   K 3.7 06/23/2013 1012   K 3.7 03/03/2013   CL 106 06/23/2013 1012   CO2 25 06/23/2013 1012   GLUCOSE 200* 06/23/2013 1012   BUN 14 06/23/2013 1012   CREATININE 1.0 06/23/2013 1012   CREATININE 1.0 03/03/2013   CALCIUM 9.0 06/23/2013 1012    Lipid Panel     Component Value Date/Time   CHOL 152 06/23/2013 1012   TRIG 175.0* 06/23/2013 1012   HDL 30.10* 06/23/2013 1012   CHOLHDL 5 06/23/2013 1012   VLDL 35.0 06/23/2013 1012   LDLCALC 87 06/23/2013 1012    CBC    Component Value Date/Time   WBC 11.16 03/03/2013   HGB 13.9 03/03/2013    Hgb A1C Lab Results  Component Value Date   HGBA1C 6.7* 06/23/2013        Assessment & Plan:   Flu like symptoms:  Rapid Flu: negative Due  to his symptoms, will check chest xray to r/o pneumonia If xray positive, will start Levaquin If xray negative, supportive care with fluids, rest and tylenol  RTC as needed or if symptoms persist, ER if worse

## 2013-08-18 NOTE — Patient Instructions (Signed)
Viral Infections °A virus is a type of germ. Viruses can cause: °· Minor sore throats. °· Aches and pains. °· Headaches. °· Runny nose. °· Rashes. °· Watery eyes. °· Tiredness. °· Coughs. °· Loss of appetite. °· Feeling sick to your stomach (nausea). °· Throwing up (vomiting). °· Watery poop (diarrhea). °HOME CARE  °· Only take medicines as told by your doctor. °· Drink enough water and fluids to keep your pee (urine) clear or pale yellow. Sports drinks are a good choice. °· Get plenty of rest and eat healthy. Soups and broths with crackers or rice are fine. °GET HELP RIGHT AWAY IF:  °· You have a very bad headache. °· You have shortness of breath. °· You have chest pain or neck pain. °· You have an unusual rash. °· You cannot stop throwing up. °· You have watery poop that does not stop. °· You cannot keep fluids down. °· You or your child has a temperature by mouth above 102° F (38.9° C), not controlled by medicine. °· Your baby is older than 3 months with a rectal temperature of 102° F (38.9° C) or higher. °· Your baby is 3 months old or younger with a rectal temperature of 100.4° F (38° C) or higher. °MAKE SURE YOU:  °· Understand these instructions. °· Will watch this condition. °· Will get help right away if you are not doing well or get worse. °Document Released: 06/07/2008 Document Revised: 09/17/2011 Document Reviewed: 10/31/2010 °[employer]® Patient Information ©2014 [employer]. ° °

## 2013-08-19 ENCOUNTER — Encounter: Payer: Self-pay | Admitting: Family Medicine

## 2013-08-19 ENCOUNTER — Ambulatory Visit: Payer: Medicare Other | Admitting: Internal Medicine

## 2013-08-20 ENCOUNTER — Other Ambulatory Visit: Payer: Self-pay | Admitting: Internal Medicine

## 2013-08-20 ENCOUNTER — Encounter (HOSPITAL_COMMUNITY): Payer: Self-pay | Admitting: Emergency Medicine

## 2013-08-20 ENCOUNTER — Inpatient Hospital Stay (HOSPITAL_COMMUNITY)
Admission: EM | Admit: 2013-08-20 | Discharge: 2013-09-06 | DRG: 246 | Disposition: E | Payer: Medicare Other | Attending: Cardiovascular Disease | Admitting: Cardiovascular Disease

## 2013-08-20 ENCOUNTER — Emergency Department (HOSPITAL_COMMUNITY): Payer: Medicare Other

## 2013-08-20 DIAGNOSIS — M1990 Unspecified osteoarthritis, unspecified site: Secondary | ICD-10-CM | POA: Diagnosis present

## 2013-08-20 DIAGNOSIS — I10 Essential (primary) hypertension: Secondary | ICD-10-CM

## 2013-08-20 DIAGNOSIS — B958 Unspecified staphylococcus as the cause of diseases classified elsewhere: Secondary | ICD-10-CM | POA: Diagnosis present

## 2013-08-20 DIAGNOSIS — R7881 Bacteremia: Secondary | ICD-10-CM

## 2013-08-20 DIAGNOSIS — I7090 Unspecified atherosclerosis: Secondary | ICD-10-CM | POA: Diagnosis present

## 2013-08-20 DIAGNOSIS — I359 Nonrheumatic aortic valve disorder, unspecified: Secondary | ICD-10-CM | POA: Diagnosis present

## 2013-08-20 DIAGNOSIS — I5031 Acute diastolic (congestive) heart failure: Secondary | ICD-10-CM | POA: Diagnosis present

## 2013-08-20 DIAGNOSIS — J441 Chronic obstructive pulmonary disease with (acute) exacerbation: Secondary | ICD-10-CM

## 2013-08-20 DIAGNOSIS — I4891 Unspecified atrial fibrillation: Secondary | ICD-10-CM | POA: Diagnosis present

## 2013-08-20 DIAGNOSIS — J309 Allergic rhinitis, unspecified: Secondary | ICD-10-CM | POA: Diagnosis present

## 2013-08-20 DIAGNOSIS — C3410 Malignant neoplasm of upper lobe, unspecified bronchus or lung: Secondary | ICD-10-CM | POA: Diagnosis present

## 2013-08-20 DIAGNOSIS — J189 Pneumonia, unspecified organism: Secondary | ICD-10-CM

## 2013-08-20 DIAGNOSIS — E785 Hyperlipidemia, unspecified: Secondary | ICD-10-CM | POA: Diagnosis present

## 2013-08-20 DIAGNOSIS — E872 Acidosis, unspecified: Secondary | ICD-10-CM | POA: Diagnosis present

## 2013-08-20 DIAGNOSIS — R195 Other fecal abnormalities: Secondary | ICD-10-CM | POA: Diagnosis present

## 2013-08-20 DIAGNOSIS — R0989 Other specified symptoms and signs involving the circulatory and respiratory systems: Secondary | ICD-10-CM

## 2013-08-20 DIAGNOSIS — B957 Other staphylococcus as the cause of diseases classified elsewhere: Secondary | ICD-10-CM | POA: Diagnosis present

## 2013-08-20 DIAGNOSIS — R011 Cardiac murmur, unspecified: Secondary | ICD-10-CM | POA: Diagnosis present

## 2013-08-20 DIAGNOSIS — R6520 Severe sepsis without septic shock: Secondary | ICD-10-CM

## 2013-08-20 DIAGNOSIS — Z87891 Personal history of nicotine dependence: Secondary | ICD-10-CM

## 2013-08-20 DIAGNOSIS — I330 Acute and subacute infective endocarditis: Secondary | ICD-10-CM | POA: Diagnosis present

## 2013-08-20 DIAGNOSIS — K5900 Constipation, unspecified: Secondary | ICD-10-CM | POA: Diagnosis not present

## 2013-08-20 DIAGNOSIS — N200 Calculus of kidney: Secondary | ICD-10-CM | POA: Diagnosis present

## 2013-08-20 DIAGNOSIS — R161 Splenomegaly, not elsewhere classified: Secondary | ICD-10-CM | POA: Diagnosis present

## 2013-08-20 DIAGNOSIS — R972 Elevated prostate specific antigen [PSA]: Secondary | ICD-10-CM | POA: Diagnosis present

## 2013-08-20 DIAGNOSIS — J438 Other emphysema: Secondary | ICD-10-CM

## 2013-08-20 DIAGNOSIS — R630 Anorexia: Secondary | ICD-10-CM | POA: Diagnosis present

## 2013-08-20 DIAGNOSIS — E860 Dehydration: Secondary | ICD-10-CM

## 2013-08-20 DIAGNOSIS — A419 Sepsis, unspecified organism: Secondary | ICD-10-CM | POA: Diagnosis not present

## 2013-08-20 DIAGNOSIS — R0609 Other forms of dyspnea: Secondary | ICD-10-CM

## 2013-08-20 DIAGNOSIS — R339 Retention of urine, unspecified: Secondary | ICD-10-CM | POA: Diagnosis present

## 2013-08-20 DIAGNOSIS — J61 Pneumoconiosis due to asbestos and other mineral fibers: Secondary | ICD-10-CM

## 2013-08-20 DIAGNOSIS — E871 Hypo-osmolality and hyponatremia: Secondary | ICD-10-CM | POA: Diagnosis not present

## 2013-08-20 DIAGNOSIS — N179 Acute kidney failure, unspecified: Secondary | ICD-10-CM

## 2013-08-20 DIAGNOSIS — I959 Hypotension, unspecified: Secondary | ICD-10-CM

## 2013-08-20 DIAGNOSIS — I350 Nonrheumatic aortic (valve) stenosis: Secondary | ICD-10-CM

## 2013-08-20 DIAGNOSIS — R0600 Dyspnea, unspecified: Secondary | ICD-10-CM

## 2013-08-20 DIAGNOSIS — J45901 Unspecified asthma with (acute) exacerbation: Secondary | ICD-10-CM

## 2013-08-20 DIAGNOSIS — D72829 Elevated white blood cell count, unspecified: Secondary | ICD-10-CM | POA: Diagnosis present

## 2013-08-20 DIAGNOSIS — J9600 Acute respiratory failure, unspecified whether with hypoxia or hypercapnia: Secondary | ICD-10-CM

## 2013-08-20 DIAGNOSIS — I2789 Other specified pulmonary heart diseases: Secondary | ICD-10-CM | POA: Diagnosis present

## 2013-08-20 DIAGNOSIS — I4510 Unspecified right bundle-branch block: Secondary | ICD-10-CM | POA: Diagnosis present

## 2013-08-20 DIAGNOSIS — F411 Generalized anxiety disorder: Secondary | ICD-10-CM | POA: Diagnosis present

## 2013-08-20 DIAGNOSIS — J439 Emphysema, unspecified: Secondary | ICD-10-CM

## 2013-08-20 DIAGNOSIS — Z66 Do not resuscitate: Secondary | ICD-10-CM | POA: Diagnosis not present

## 2013-08-20 DIAGNOSIS — Z515 Encounter for palliative care: Secondary | ICD-10-CM

## 2013-08-20 DIAGNOSIS — G9340 Encephalopathy, unspecified: Secondary | ICD-10-CM | POA: Diagnosis present

## 2013-08-20 DIAGNOSIS — E119 Type 2 diabetes mellitus without complications: Secondary | ICD-10-CM | POA: Diagnosis present

## 2013-08-20 DIAGNOSIS — R6521 Severe sepsis with septic shock: Secondary | ICD-10-CM

## 2013-08-20 DIAGNOSIS — I509 Heart failure, unspecified: Secondary | ICD-10-CM | POA: Diagnosis present

## 2013-08-20 DIAGNOSIS — Z6835 Body mass index (BMI) 35.0-35.9, adult: Secondary | ICD-10-CM

## 2013-08-20 DIAGNOSIS — I2510 Atherosclerotic heart disease of native coronary artery without angina pectoris: Secondary | ICD-10-CM

## 2013-08-20 DIAGNOSIS — K921 Melena: Secondary | ICD-10-CM | POA: Diagnosis present

## 2013-08-20 DIAGNOSIS — E876 Hypokalemia: Secondary | ICD-10-CM | POA: Insufficient documentation

## 2013-08-20 DIAGNOSIS — I5030 Unspecified diastolic (congestive) heart failure: Secondary | ICD-10-CM

## 2013-08-20 DIAGNOSIS — D649 Anemia, unspecified: Secondary | ICD-10-CM | POA: Diagnosis present

## 2013-08-20 DIAGNOSIS — I214 Non-ST elevation (NSTEMI) myocardial infarction: Principal | ICD-10-CM

## 2013-08-20 HISTORY — DX: Contact with and (suspected) exposure to asbestos: Z77.090

## 2013-08-20 LAB — POCT I-STAT, CHEM 8
BUN: 28 mg/dL — ABNORMAL HIGH (ref 6–23)
Calcium, Ion: 1.09 mmol/L — ABNORMAL LOW (ref 1.13–1.30)
Chloride: 99 mEq/L (ref 96–112)
Creatinine, Ser: 1.8 mg/dL — ABNORMAL HIGH (ref 0.50–1.35)
Glucose, Bld: 219 mg/dL — ABNORMAL HIGH (ref 70–99)
HCT: 37 % — ABNORMAL LOW (ref 39.0–52.0)
Hemoglobin: 12.6 g/dL — ABNORMAL LOW (ref 13.0–17.0)
Potassium: 3.5 mEq/L — ABNORMAL LOW (ref 3.7–5.3)
Sodium: 138 mEq/L (ref 137–147)
TCO2: 24 mmol/L (ref 0–100)

## 2013-08-20 LAB — POCT I-STAT TROPONIN I: TROPONIN I, POC: 0.4 ng/mL — AB (ref 0.00–0.08)

## 2013-08-20 LAB — PRO B NATRIURETIC PEPTIDE: Pro B Natriuretic peptide (BNP): 679.3 pg/mL — ABNORMAL HIGH (ref 0–125)

## 2013-08-20 LAB — GLUCOSE, CAPILLARY
GLUCOSE-CAPILLARY: 167 mg/dL — AB (ref 70–99)
Glucose-Capillary: 165 mg/dL — ABNORMAL HIGH (ref 70–99)

## 2013-08-20 LAB — CBC
HCT: 32.9 % — ABNORMAL LOW (ref 39.0–52.0)
Hemoglobin: 11.1 g/dL — ABNORMAL LOW (ref 13.0–17.0)
MCH: 28.7 pg (ref 26.0–34.0)
MCHC: 33.7 g/dL (ref 30.0–36.0)
MCV: 85 fL (ref 78.0–100.0)
Platelets: 157 10*3/uL (ref 150–400)
RBC: 3.87 MIL/uL — ABNORMAL LOW (ref 4.22–5.81)
RDW: 14.5 % (ref 11.5–15.5)
WBC: 12.8 10*3/uL — AB (ref 4.0–10.5)

## 2013-08-20 LAB — HEPARIN LEVEL (UNFRACTIONATED): Heparin Unfractionated: <0.1 IU/mL — ABNORMAL LOW (ref 0.30–0.70)

## 2013-08-20 LAB — BASIC METABOLIC PANEL
BUN: 31 mg/dL — ABNORMAL HIGH (ref 6–23)
CHLORIDE: 98 meq/L (ref 96–112)
CO2: 24 mEq/L (ref 19–32)
Calcium: 8.8 mg/dL (ref 8.4–10.5)
Creatinine, Ser: 1.7 mg/dL — ABNORMAL HIGH (ref 0.50–1.35)
GFR, EST AFRICAN AMERICAN: 44 mL/min — AB (ref >90)
GFR, EST NON AFRICAN AMERICAN: 38 mL/min — AB (ref >90)
Glucose, Bld: 223 mg/dL — ABNORMAL HIGH (ref 70–99)
POTASSIUM: 3.8 meq/L (ref 3.7–5.3)
Sodium: 140 mEq/L (ref 137–147)

## 2013-08-20 LAB — MAGNESIUM: Magnesium: 2.2 mg/dL (ref 1.5–2.5)

## 2013-08-20 LAB — APTT: aPTT: 26 seconds (ref 24–37)

## 2013-08-20 LAB — MRSA PCR SCREENING: MRSA by PCR: NEGATIVE

## 2013-08-20 LAB — PROTIME-INR
INR: 1.1 (ref 0.00–1.49)
Prothrombin Time: 14 seconds (ref 11.6–15.2)

## 2013-08-20 LAB — TROPONIN I
Troponin I: 0.74 ng/mL (ref <0.30)
Troponin I: 0.63 ng/mL (ref <0.30)

## 2013-08-20 MED ORDER — HEPARIN BOLUS VIA INFUSION
3000.0000 [IU] | Freq: Once | INTRAVENOUS | Status: AC
  Administered 2013-08-21: 3000 [IU] via INTRAVENOUS
  Filled 2013-08-20: qty 3000

## 2013-08-20 MED ORDER — ASPIRIN 81 MG PO CHEW
324.0000 mg | CHEWABLE_TABLET | Freq: Once | ORAL | Status: AC
  Administered 2013-08-20: 324 mg via ORAL
  Filled 2013-08-20: qty 4

## 2013-08-20 MED ORDER — HEPARIN BOLUS VIA INFUSION
4000.0000 [IU] | Freq: Once | INTRAVENOUS | Status: AC
  Administered 2013-08-20: 4000 [IU] via INTRAVENOUS
  Filled 2013-08-20: qty 4000

## 2013-08-20 MED ORDER — SIMVASTATIN 40 MG PO TABS
40.0000 mg | ORAL_TABLET | Freq: Every day | ORAL | Status: DC
  Administered 2013-08-21 – 2013-08-31 (×11): 40 mg via ORAL
  Filled 2013-08-20 (×12): qty 1

## 2013-08-20 MED ORDER — SODIUM CHLORIDE 0.9 % IV SOLN: 250.0000 mL | INTRAVENOUS | Status: DC | PRN

## 2013-08-20 MED ORDER — SODIUM CHLORIDE 0.9 % IV SOLN
INTRAVENOUS | Status: DC
  Administered 2013-08-20 – 2013-08-21 (×2): via INTRAVENOUS

## 2013-08-20 MED ORDER — SODIUM CHLORIDE 0.9 % IJ SOLN: 3.0000 mL | INTRAMUSCULAR | Status: DC | PRN

## 2013-08-20 MED ORDER — LEVOFLOXACIN 500 MG PO TABS
500.0000 mg | ORAL_TABLET | Freq: Every day | ORAL | Status: DC
  Administered 2013-08-21 – 2013-08-22 (×2): 500 mg via ORAL
  Filled 2013-08-20 (×2): qty 1

## 2013-08-20 MED ORDER — ASPIRIN EC 81 MG PO TBEC
81.0000 mg | DELAYED_RELEASE_TABLET | Freq: Every day | ORAL | Status: DC
  Administered 2013-08-21 – 2013-08-27 (×7): 81 mg via ORAL
  Filled 2013-08-20 (×8): qty 1

## 2013-08-20 MED ORDER — ZOLPIDEM TARTRATE 5 MG PO TABS
5.0000 mg | ORAL_TABLET | Freq: Every evening | ORAL | Status: DC | PRN
  Administered 2013-08-20 – 2013-08-25 (×2): 5 mg via ORAL
  Filled 2013-08-20 (×2): qty 1

## 2013-08-20 MED ORDER — BUDESONIDE-FORMOTEROL FUMARATE 160-4.5 MCG/ACT IN AERO
2.0000 | INHALATION_SPRAY | Freq: Two times a day (BID) | RESPIRATORY_TRACT | Status: DC
  Administered 2013-08-20 – 2013-08-26 (×13): 2 via RESPIRATORY_TRACT
  Filled 2013-08-20: qty 6

## 2013-08-20 MED ORDER — ALBUTEROL SULFATE (2.5 MG/3ML) 0.083% IN NEBU
2.5000 mg | INHALATION_SOLUTION | Freq: Three times a day (TID) | RESPIRATORY_TRACT | Status: DC
  Administered 2013-08-20 – 2013-08-21 (×2): 2.5 mg via RESPIRATORY_TRACT
  Filled 2013-08-20 (×2): qty 3

## 2013-08-20 MED ORDER — NYSTATIN 100000 UNIT/ML MT SUSP
5.0000 mL | Freq: Four times a day (QID) | OROMUCOSAL | Status: DC
  Administered 2013-08-20 – 2013-08-31 (×43): 500000 [IU] via ORAL
  Filled 2013-08-20 (×51): qty 5

## 2013-08-20 MED ORDER — NYSTATIN 100000 UNIT/ML MT SUSP: 5.0000 mL | Freq: Four times a day (QID) | OROMUCOSAL | Status: AC

## 2013-08-20 MED ORDER — LORAZEPAM 0.5 MG PO TABS
0.5000 mg | ORAL_TABLET | Freq: Four times a day (QID) | ORAL | Status: DC | PRN
  Administered 2013-08-20 – 2013-08-27 (×19): 0.5 mg via ORAL
  Filled 2013-08-20 (×19): qty 1

## 2013-08-20 MED ORDER — SODIUM CHLORIDE 0.9 % IJ SOLN
3.0000 mL | Freq: Two times a day (BID) | INTRAMUSCULAR | Status: DC
  Administered 2013-08-20: 3 mL via INTRAVENOUS

## 2013-08-20 MED ORDER — ASPIRIN 81 MG PO CHEW
81.0000 mg | CHEWABLE_TABLET | ORAL | Status: AC
  Administered 2013-08-21: 81 mg via ORAL
  Filled 2013-08-20: qty 1

## 2013-08-20 MED ORDER — TIOTROPIUM BROMIDE MONOHYDRATE 18 MCG IN CAPS
18.0000 ug | ORAL_CAPSULE | Freq: Every day | RESPIRATORY_TRACT | Status: DC
  Administered 2013-08-21 – 2013-08-26 (×6): 18 ug via RESPIRATORY_TRACT
  Filled 2013-08-20 (×2): qty 5

## 2013-08-20 MED ORDER — HEPARIN (PORCINE) IN NACL 100-0.45 UNIT/ML-% IJ SOLN
1500.0000 [IU]/h | INTRAMUSCULAR | Status: DC
  Administered 2013-08-20 (×2): 1100 [IU]/h via INTRAVENOUS
  Filled 2013-08-20 (×4): qty 250

## 2013-08-20 MED ORDER — DOCUSATE SODIUM 100 MG PO CAPS
100.0000 mg | ORAL_CAPSULE | Freq: Every day | ORAL | Status: DC
  Administered 2013-08-22 – 2013-08-26 (×5): 100 mg via ORAL
  Filled 2013-08-20 (×8): qty 1

## 2013-08-20 MED ORDER — DIAZEPAM 5 MG PO TABS
5.0000 mg | ORAL_TABLET | ORAL | Status: AC
  Administered 2013-08-21: 5 mg via ORAL
  Filled 2013-08-20: qty 1

## 2013-08-20 MED ORDER — INSULIN ASPART 100 UNIT/ML ~~LOC~~ SOLN
0.0000 [IU] | Freq: Three times a day (TID) | SUBCUTANEOUS | Status: DC
  Administered 2013-08-20 – 2013-08-22 (×6): 2 [IU] via SUBCUTANEOUS
  Administered 2013-08-23 – 2013-08-24 (×5): 1 [IU] via SUBCUTANEOUS
  Administered 2013-08-24 – 2013-08-25 (×2): 2 [IU] via SUBCUTANEOUS
  Administered 2013-08-26 – 2013-08-27 (×3): 1 [IU] via SUBCUTANEOUS
  Administered 2013-08-27: 2 [IU] via SUBCUTANEOUS

## 2013-08-20 MED ORDER — NITROGLYCERIN 0.4 MG SL SUBL
0.4000 mg | SUBLINGUAL_TABLET | SUBLINGUAL | Status: DC | PRN
  Administered 2013-08-21 – 2013-08-27 (×4): 0.4 mg via SUBLINGUAL
  Filled 2013-08-20 (×2): qty 25

## 2013-08-20 NOTE — H&P (Signed)
PCP: Ria Bush, MD  Anthony Turner is an 73 y.o. male.   Chief Complaint:  Dyspnea/CP HPI:   The patient is a 73 year old Caucasian male with history of COPD, asbestos exposure with chest x-ray concerning for asbestosis, hypertension, hyperlipidemia, aortic stenosis, which was moderate by last echo in October 2014 with EF of 55% (unchanged from prior echo 1 year ago), asthma, diabetes mellitus and remote tobacco history.  Patient presents with increase in dyspnea since Sunday particularly when he lays down. +PND. Wt has increased slightly.   It should be noted the patient gets very anxious when he lays down and is extremely claustrophobic as well. He had chest pain 2 days ago which he says resolved after he got up and walked around for a few hours.  He noted associated nausea.  He reports minimal lower extremity edema.  Was recently diagnosed with heme positive stools however, the patient states that it is from his hemorrhoids.  He is also on day 3 of Levaquin for pneumonia.    Past Medical History  Diagnosis Date  . COPD (chronic obstructive pulmonary disease)   . Type 2 diabetes mellitus, controlled   . Asthma   . Arthritis     with DIP nodules  . History of diverticulitis of colon   . Seasonal allergic rhinitis   . Aortic stenosis     mod by last echo (04/2013) with EF 55%  . HTN (hypertension)   . HLD (hyperlipidemia)   . 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 Asbestos exposure     Past Surgical History  Procedure Laterality Date  . Appendectomy  ? 1956  . Tonsillectomy  ? 1944  . Cataract extraction Bilateral 2012    Family History  Problem Relation Age of Onset  . Alcohol abuse Father    Social History:  reports that he quit smoking about 31 years ago. His smoking use included Cigarettes. He has a 140 pack-year smoking history. He has never used smokeless tobacco. He reports that he drinks alcohol. He reports that he does not  use illicit drugs.  Allergies:  Allergies  Allergen Reactions  . Sulfa Antibiotics Nausea Only     (Not in a hospital admission)  Results for orders placed during the hospital encounter of 09/05/2013 (from the past 48 hour(s))  CBC     Status: Abnormal   Collection Time    08/29/2013 11:46 AM      Result Value Ref Range   WBC 12.8 (*) 4.0 - 10.5 K/uL   RBC 3.87 (*) 4.22 - 5.81 MIL/uL   Hemoglobin 11.1 (*) 13.0 - 17.0 g/dL   HCT 32.9 (*) 39.0 - 52.0 %   MCV 85.0  78.0 - 100.0 fL   MCH 28.7  26.0 - 34.0 pg   MCHC 33.7  30.0 - 36.0 g/dL   RDW 14.5  11.5 - 15.5 %   Platelets 157  150 - 400 K/uL  BASIC METABOLIC PANEL     Status: Abnormal   Collection Time    09/03/2013 11:46 AM      Result Value Ref Range   Sodium 140  137 - 147 mEq/L   Potassium 3.8  3.7 - 5.3 mEq/L   Chloride 98  96 - 112 mEq/L   CO2 24  19 - 32 mEq/L   Glucose, Bld 223 (*) 70 - 99 mg/dL   BUN 31 (*) 6 - 23 mg/dL  Creatinine, Ser 1.70 (*) 0.50 - 1.35 mg/dL   Calcium 8.8  8.4 - 10.5 mg/dL   GFR calc non Af Amer 38 (*) >90 mL/min   GFR calc Af Amer 44 (*) >90 mL/min   Comment: (NOTE)     The eGFR has been calculated using the CKD EPI equation.     This calculation has not been validated in all clinical situations.     eGFR's persistently <90 mL/min signify possible Chronic Kidney     Disease.  PRO B NATRIURETIC PEPTIDE     Status: Abnormal   Collection Time    08/17/2013 11:46 AM      Result Value Ref Range   Pro B Natriuretic peptide (BNP) 679.3 (*) 0 - 125 pg/mL  POCT I-STAT TROPONIN I     Status: Abnormal   Collection Time    08/23/2013 12:03 PM      Result Value Ref Range   Troponin i, poc 0.40 (*) 0.00 - 0.08 ng/mL   Comment NOTIFIED PHYSICIAN     Comment 3            Comment: Due to the release kinetics of cTnI,     a negative result within the first hours     of the onset of symptoms does not rule out     myocardial infarction with certainty.     If myocardial infarction is still suspected,      repeat the test at appropriate intervals.  POCT I-STAT, CHEM 8     Status: Abnormal   Collection Time    08/22/2013 12:47 PM      Result Value Ref Range   Sodium 138  137 - 147 mEq/L   Potassium 3.5 (*) 3.7 - 5.3 mEq/L   Chloride 99  96 - 112 mEq/L   BUN 28 (*) 6 - 23 mg/dL   Creatinine, Ser 1.80 (*) 0.50 - 1.35 mg/dL   Glucose, Bld 219 (*) 70 - 99 mg/dL   Calcium, Ion 1.09 (*) 1.13 - 1.30 mmol/L   TCO2 24  0 - 100 mmol/L   Hemoglobin 12.6 (*) 13.0 - 17.0 g/dL   HCT 37.0 (*) 39.0 - 52.0 %   Dg Chest 2 View  08/10/2013   CLINICAL DATA:  Shortness of breath, history of COPD, smoking history  EXAM: CHEST  2 VIEW  COMPARISON:  DG CHEST 2 VIEW dated 08/18/2013  FINDINGS: Extensive bilateral calcification of pleural plaques, unchanged. Moderate fibrotic change bilaterally involving the lung parenchyma is stable. There is no acute opacification that was not present previously. There are no pleural effusions. Heart size is mildly enlarged and stable.  IMPRESSION: No acute cardiopulmonary process. Stable chronic changes suggesting asbestos exposure.   Electronically Signed   By: Skipper Cliche M.D.   On: 08/15/2013 11:35   Dg Chest 2 View  08/18/2013   CLINICAL DATA:  Fever.  The symptoms.  EXAM: CHEST  2 VIEW  COMPARISON:  None.  FINDINGS: There are prominent chronic changes of asbestos exposure with densely calcified pleural plaques extending throughout both hemithoraces, sparing the apices. There is coarse reticular opacity in the lungs that suggests associated fibrosis. No convincing pneumonia. No pleural effusion or pneumothorax.  The cardiac silhouette is mildly enlarged normal mediastinal and hilar contours.  Bony thorax is demineralized but intact.  IMPRESSION: 1. No convincing acute cardiopulmonary disease. However, infiltrate superimposed on the extensive chronic changes is not excluded, particularly in the left upper lobe. 2. Chronic changes include extensive  calcified pleural plaque and evidence  of interstitial fibrosis with the combination of findings suggesting asbestosis.   Electronically Signed   By: Lajean Manes M.D.   On: 08/18/2013 15:19    Review of Systems  Constitutional: Positive for diaphoresis. Negative for fever.  HENT: Negative for congestion and sore throat.   Respiratory: Positive for shortness of breath. Negative for cough and wheezing.   Cardiovascular: Positive for chest pain, leg swelling and PND.  Gastrointestinal: Positive for nausea, abdominal pain (Left mid axillary line) and blood in stool (By stool guaiac). Negative for vomiting.  Genitourinary: Negative for hematuria.  Musculoskeletal: Negative for myalgias.  Neurological: Negative for dizziness.  All other systems reviewed and are negative.    Blood pressure 98/58, pulse 94, temperature 99.5 F (37.5 C), temperature source Oral, resp. rate 19, height 5' 6"  (1.676 m), weight 218 lb (98.884 kg), SpO2 99.00%. Physical Exam  Nursing note and vitals reviewed. Constitutional: He is oriented to person, place, and time. He appears well-developed and well-nourished. No distress.  HENT:  Head: Normocephalic and atraumatic.  Mouth/Throat: No oropharyngeal exudate.  Eyes: EOM are normal. Pupils are equal, round, and reactive to light.  Neck: Normal range of motion. Neck supple. No JVD present.  Respiratory: Effort normal.  Decreased BS bilaterally.  Mild diffuse crackles.  No Wheeze.  GI: Soft. Bowel sounds are normal.  Musculoskeletal: He exhibits edema (Trace).  Neurological: He is alert and oriented to person, place, and time. He exhibits normal muscle tone.  Skin: Skin is warm.  Skin is very dry.  Psychiatric: He has a normal mood and affect.     Assessment/Plan  Principal Problem:   NSTEMI (non-ST elevated myocardial infarction) Active Problems:   Dyspnea   Hypotension   Dehydration   Acute renal failure   COPD (chronic obstructive pulmonary disease) with emphysema   Diabetes mellitus type  2, diet-controlled   Aortic stenosis   HTN (hypertension)   HLD (hyperlipidemia)   Heme positive stool   Hypokalemia  Plan:   Patient is a 73 year old male with history of moderate aortic stenosis, COPD and asbestosis, diabetes mellitus, severe anxiety while laying down, hyperlipidemia, hypertension.  He presents with worsening dyspnea since Sunday and chest pain which occurred two days ago with nausea.  Patient has a mildly elevated troponin however, EKG shows ST depression in I, V4 through 6, right bundle branch block.  This is in this setting of acute renal failure probably related to dehydration. This BNP is mildly elevated 679. Chest x-ray is consistent with asbestos related exposure over shows no acute cardiopulmonary process.  Will stop losartan and amlodipine.  Start hydration with NS at 26m/hr.  IV heparin per pharmacy.  Add lopressor when BP will tolerate.  Potassium 464m once.  Labs: Cycle troponin every 6 hours, TSH, A1c, lipids. CBC and basic metabolic panel tomorrow. Right and left heart cath when stable.  WBCs are mildly elevated. Will continue Levaquin for 4 more days.  HAGER, BRYAN 09/04/2013, 1:59 PM    Agree with note written by BrLuisa DagoAC  Pt with increased DOE and a recent episode of CP. [facility] CRF + > 120 py, quit 35 years ago, HTN. H/O Asbestosis and COPD. He describes increased DOE/ orthopnea. Labs remarkable for minimal elevated Trop, mild increase in BNP and SCr 1.9 (above baseline prob secondary to dehydration). Exam remarkable for AS murmur with radiation to neck. Clear lungs, no periph edema. EKG shows IRBBB  with lat ST depression. Plan admit to stepdown, IV hep, mild hydration. Will need R/L heart cath. Pt is very orthopneic and can't ;ay flat for prolonged periods of time so he should be done radially.Will also get 2D  Lorretta Harp 08/24/2013 2:56 PM

## 2013-08-20 NOTE — ED Notes (Signed)
Pt was dx with pnx at pcp's 2 days prior and is taking levaquin.  Also given lasix.  Pt states has been unable to lay flat d/t sob.  Breathing labored.

## 2013-08-20 NOTE — Telephone Encounter (Signed)
Just wanted to double check with you on possible thrush treatment?  See Email message.  Let me know if you want me to send something in.

## 2013-08-20 NOTE — Progress Notes (Signed)
ANTICOAGULATION CONSULT NOTE - Initial Consult  Pharmacy Consult for heparin Indication: chest pain/ACS  Allergies  Allergen Reactions  . Sulfa Antibiotics Nausea Only    Patient Measurements: Height: 5\' 6"  (167.6 cm) Weight: 218 lb (98.884 kg) IBW/kg (Calculated) : 63.8 Heparin Dosing Weight: 85kg  Vital Signs: Temp: 99.5 F (37.5 C) (02/12 1242) Temp src: Oral (02/12 1242) BP: 98/58 mmHg (02/12 1242) Pulse Rate: 94 (02/12 1145)  Labs:  Recent Labs  08/19/2013 1146 08/15/2013 1247  HGB 11.1* 12.6*  HCT 32.9* 37.0*  PLT 157  --   CREATININE 1.70* 1.80*    Estimated Creatinine Clearance: 39.6 ml/min (by C-G formula based on Cr of 1.8).   Medical History: Past Medical History  Diagnosis Date  . COPD (chronic obstructive pulmonary disease)   . Type 2 diabetes mellitus, controlled   . Asthma   . Arthritis     with DIP nodules  . History of diverticulitis of colon   . Seasonal allergic rhinitis   . Aortic stenosis     mod by last echo (04/2013) with EF 55%  . HTN (hypertension)   . HLD (hyperlipidemia)   . 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 Asbestos exposure     Assessment: 71 YOM seen at PCP office this week for worsening SOB, dry mouth and general weakness. With positive troponins and EKG with ST depression. To start heparin. Baseline Hgb 12.6, plts 157. Noted patient recently had heme positive stools (07/08/2013) but patient states from his hemorrhoids. Not on anticoagulation PTA. No overt bleeding noted.  Goal of Therapy:  Heparin level 0.3-0.7 units/ml Monitor platelets by anticoagulation protocol: Yes   Plan:  1. Heparin bolus with 4000 units IV x1 2. Start heparin drip at 1100 units/hr 3. 8 hour HL 4. Daily HL and CBC 5. Follow for s/s bleeding 6. aptt and INR for baseline values before heparin hung  Verba Ainley D. Wilbert Hayashi, PharmD, BCPS Clinical Pharmacist Pager: [phone] 08/26/2013 1:48 PM

## 2013-08-20 NOTE — ED Notes (Signed)
Cardiology at bedside.

## 2013-08-20 NOTE — ED Provider Notes (Signed)
CSN: [id number]     Arrival date & time 08/30/2013  1041 History   First MD Initiated Contact with Patient 09/05/2013 1105     Chief Complaint  Patient presents with  . Shortness of Breath     (Consider location/radiation/quality/duration/timing/severity/associated sxs/prior Treatment) Patient is a 73 y.o. male presenting with shortness of breath.  Shortness of Breath  Pt with history of COPD, asbestosis recently seen at PCP office for worsening SOB, dry mouth, general weakness and fevers. He was started on Levaquin and also given a dose of Lasix with minimal improvement. He reports continued difficulty with breathing, waxes and wanes, worse when lying flat, improved with O2 in the ED.   Past Medical History  Diagnosis Date  . COPD (chronic obstructive pulmonary disease)   . Type 2 diabetes mellitus, controlled   . Asthma   . Arthritis     with DIP nodules  . History of diverticulitis of colon   . Seasonal allergic rhinitis   . Aortic stenosis     mod by last echo (04/2013) with EF 55%  . HTN (hypertension)   . HLD (hyperlipidemia)   . 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 Asbestos exposure    Past Surgical History  Procedure Laterality Date  . Appendectomy  ? 1956  . Tonsillectomy  ? 1944  . Cataract extraction Bilateral 2012   Family History  Problem Relation Age of Onset  . Alcohol abuse Father    History  Substance Use Topics  . Smoking status: Former Smoker -- 4.00 packs/day for 35 years    Types: Cigarettes    Quit date: 02/21/1982  . Smokeless tobacco: Never Used  . Alcohol Use: Yes     Comment: Occasional    Review of Systems  Respiratory: Positive for shortness of breath.     All other systems reviewed and are negative except as noted in HPI.    Allergies  Sulfa antibiotics  Home Medications   Current Outpatient Rx  Name  Route  Sig  Dispense  Refill  . albuterol (PROVENTIL HFA;VENTOLIN HFA) 108 (90 BASE)  MCG/ACT inhaler   Inhalation   Inhale 2 puffs into the lungs 3 (three) times daily.         Marland Kitchen amLODipine (NORVASC) 5 MG tablet   Oral   Take 2.5 mg by mouth daily.          Marland Kitchen aspirin 81 MG tablet   Oral   Take 81 mg by mouth daily.         . budesonide-formoterol (SYMBICORT) 160-4.5 MCG/ACT inhaler   Inhalation   Inhale 2 puffs into the lungs 2 (two) times daily.   1 Inhaler   3   . Docusate Calcium (STOOL SOFTENER PO)   Oral   Take by mouth.         . furosemide (LASIX) 20 MG tablet   Oral   Take 1 tablet (20 mg total) by mouth daily as needed.   30 tablet   1   . hydrochlorothiazide (HYDRODIURIL) 25 MG tablet   Oral   Take 25 mg by mouth daily.         Marland Kitchen levofloxacin (LEVAQUIN) 500 MG tablet   Oral   Take 1 tablet (500 mg total) by mouth daily.   7 tablet   0   . losartan (COZAAR) 100 MG tablet   Oral   Take 100 mg by mouth daily.         Marland Kitchen  potassium chloride SA (K-DUR,KLOR-CON) 20 MEQ tablet   Oral   Take 20 mEq by mouth daily.         . pravastatin (PRAVACHOL) 80 MG tablet   Oral   Take 80 mg by mouth daily.         Marland Kitchen tiotropium (SPIRIVA) 18 MCG inhalation capsule   Inhalation   Place 18 mcg into inhaler and inhale daily.         . vitamin B-12 (CYANOCOBALAMIN) 1000 MCG tablet   Oral   Take 1,000 mcg by mouth daily.          BP 102/51  Pulse 100  Temp(Src) 98.2 F (36.8 C) (Oral)  Resp 30  Ht 5\' 6"  (1.676 m)  Wt 218 lb (98.884 kg)  BMI 35.20 kg/m2  SpO2 93% Physical Exam  Nursing note and vitals reviewed. Constitutional: He is oriented to person, place, and time. He appears well-developed and well-nourished.  HENT:  Head: Normocephalic and atraumatic.  Eyes: EOM are normal. Pupils are equal, round, and reactive to light.  Neck: Normal range of motion. Neck supple.  Cardiovascular: Normal rate, normal heart sounds and intact distal pulses.   Pulmonary/Chest: Effort normal. No respiratory distress. He has wheezes. He  has rales.  Abdominal: Bowel sounds are normal. He exhibits no distension. There is no tenderness.  Musculoskeletal: Normal range of motion. He exhibits edema (trace BLE). He exhibits no tenderness.  Neurological: He is alert and oriented to person, place, and time. He has normal strength. No cranial nerve deficit or sensory deficit.  Skin: Skin is warm and dry. No rash noted.  Psychiatric: He has a normal mood and affect.    ED Course  Procedures (including critical care time) Labs Review Labs Reviewed  CBC  BASIC METABOLIC PANEL  PRO B NATRIURETIC PEPTIDE   Imaging Review Dg Chest 2 View  08/18/2013   CLINICAL DATA:  Fever.  The symptoms.  EXAM: CHEST  2 VIEW  COMPARISON:  None.  FINDINGS: There are prominent chronic changes of asbestos exposure with densely calcified pleural plaques extending throughout both hemithoraces, sparing the apices. There is coarse reticular opacity in the lungs that suggests associated fibrosis. No convincing pneumonia. No pleural effusion or pneumothorax.  The cardiac silhouette is mildly enlarged normal mediastinal and hilar contours.  Bony thorax is demineralized but intact.  IMPRESSION: 1. No convincing acute cardiopulmonary disease. However, infiltrate superimposed on the extensive chronic changes is not excluded, particularly in the left upper lobe. 2. Chronic changes include extensive calcified pleural plaque and evidence of interstitial fibrosis with the combination of findings suggesting asbestosis.   Electronically Signed   By: Lajean Manes M.D.   On: 08/18/2013 15:19    EKG Interpretation    Date/Time:  Thursday August 20 2013 10:55:13 EST Ventricular Rate:  95 PR Interval:  158 QRS Duration: 100 QT Interval:  332 QTC Calculation: 417 R Axis:   -23 Text Interpretation:  Normal sinus rhythm Incomplete right bundle branch block ST depression, consider subendocardial injury Nonspecific T wave abnormality Abnormal ECG No old tracing to compare  Confirmed by [facility]  MD, Payge Eppes [phone]) on 08/22/2013 11:14:26 AM            MDM   Final diagnoses:  NSTEMI (non-ST elevated myocardial infarction)   EKG with concerning ST depression and positive POC Trop. Lab chemistry analyzer is down, cannot confirm with lab Trop. ASA given. No chest pains now. Discussed with Cardiology who will come evaluate the patient.  Adetokunbo Mccadden B. Karle Starch, MD 08/30/2013 1355

## 2013-08-20 NOTE — ED Notes (Signed)
Troponin results given to Dr. Karle Starch

## 2013-08-20 NOTE — Progress Notes (Signed)
ANTICOAGULATION CONSULT NOTE - Follow Up Consult  Pharmacy Consult for heparin Indication: NSTEMI  Labs:  Recent Labs  08/29/2013 1146 08/31/2013 1247 08/21/2013 1351 08/17/2013 1438 08/19/2013 2130 08/18/2013 2230  HGB 11.1* 12.6*  --   --   --   --   HCT 32.9* 37.0*  --   --   --   --   PLT 157  --   --   --   --   --   APTT  --   --  26  --   --   --   LABPROT  --   --  14.0  --   --   --   INR  --   --  1.10  --   --   --   HEPARINUNFRC  --   --   --   --   --  <0.10*  CREATININE 1.70* 1.80*  --   --   --   --   TROPONINI  --   --   --  0.74* 0.63*  --     Assessment: 72yo male undetectable on heparin with initial dosing for NSTEMI.  Goal of Therapy:  Heparin level 0.3-0.7 units/ml   Plan:  Will rebolus with heparin 3000 units x1 and increase gtt by 4 units/kg/hr to 1500 units/hr and check level in [place], PharmD, BCPS  08/13/2013,11:49 PM

## 2013-08-20 NOTE — Progress Notes (Signed)
bp was noted to be slightly lower in the r arm than the left.

## 2013-08-21 ENCOUNTER — Inpatient Hospital Stay (HOSPITAL_COMMUNITY): Payer: Medicare Other

## 2013-08-21 ENCOUNTER — Encounter (HOSPITAL_COMMUNITY): Admission: EM | Disposition: E | Payer: Medicare Other | Source: Home / Self Care | Attending: Cardiovascular Disease

## 2013-08-21 DIAGNOSIS — R509 Fever, unspecified: Secondary | ICD-10-CM

## 2013-08-21 DIAGNOSIS — J449 Chronic obstructive pulmonary disease, unspecified: Secondary | ICD-10-CM

## 2013-08-21 DIAGNOSIS — I2510 Atherosclerotic heart disease of native coronary artery without angina pectoris: Secondary | ICD-10-CM

## 2013-08-21 DIAGNOSIS — E785 Hyperlipidemia, unspecified: Secondary | ICD-10-CM

## 2013-08-21 DIAGNOSIS — I359 Nonrheumatic aortic valve disorder, unspecified: Secondary | ICD-10-CM

## 2013-08-21 HISTORY — PX: LEFT AND RIGHT HEART CATHETERIZATION WITH CORONARY ANGIOGRAM: SHX5449

## 2013-08-21 LAB — BASIC METABOLIC PANEL
BUN: 31 mg/dL — AB (ref 6–23)
CO2: 23 mEq/L (ref 19–32)
Calcium: 8.3 mg/dL — ABNORMAL LOW (ref 8.4–10.5)
Chloride: 97 mEq/L (ref 96–112)
Creatinine, Ser: 1.51 mg/dL — ABNORMAL HIGH (ref 0.50–1.35)
GFR calc Af Amer: 51 mL/min — ABNORMAL LOW (ref >90)
GFR, EST NON AFRICAN AMERICAN: 44 mL/min — AB (ref >90)
Glucose, Bld: 178 mg/dL — ABNORMAL HIGH (ref 70–99)
POTASSIUM: 3.3 meq/L — AB (ref 3.7–5.3)
SODIUM: 137 meq/L (ref 137–147)

## 2013-08-21 LAB — POCT ACTIVATED CLOTTING TIME
ACTIVATED CLOTTING TIME: 182 s
Activated Clotting Time: 132 seconds

## 2013-08-21 LAB — LIPID PANEL
Cholesterol: 115 mg/dL (ref 0–200)
HDL: 17 mg/dL — ABNORMAL LOW (ref >39)
LDL Cholesterol: 63 mg/dL (ref 0–99)
Total CHOL/HDL Ratio: 6.8 RATIO
Triglycerides: 173 mg/dL — ABNORMAL HIGH (ref <150)
VLDL: 35 mg/dL (ref 0–40)

## 2013-08-21 LAB — CBC
HEMATOCRIT: 30.6 % — AB (ref 39.0–52.0)
Hemoglobin: 10.4 g/dL — ABNORMAL LOW (ref 13.0–17.0)
MCH: 29 pg (ref 26.0–34.0)
MCHC: 34 g/dL (ref 30.0–36.0)
MCV: 85.2 fL (ref 78.0–100.0)
Platelets: 143 10*3/uL — ABNORMAL LOW (ref 150–400)
RBC: 3.59 MIL/uL — AB (ref 4.22–5.81)
RDW: 14.8 % (ref 11.5–15.5)
WBC: 12.2 10*3/uL — ABNORMAL HIGH (ref 4.0–10.5)

## 2013-08-21 LAB — POCT I-STAT 3, VENOUS BLOOD GAS (G3P V)
Acid-Base Excess: 3 mmol/L — ABNORMAL HIGH (ref 0.0–2.0)
BICARBONATE: 28.6 meq/L — AB (ref 20.0–24.0)
O2 Saturation: 63 %
PH VEN: 7.394 — AB (ref 7.250–7.300)
PO2 VEN: 33 mmHg (ref 30.0–45.0)
TCO2: 30 mmol/L (ref 0–100)
pCO2, Ven: 46.9 mmHg (ref 45.0–50.0)

## 2013-08-21 LAB — URINALYSIS, ROUTINE W REFLEX MICROSCOPIC
Bilirubin Urine: NEGATIVE
Glucose, UA: NEGATIVE mg/dL
Hgb urine dipstick: NEGATIVE
KETONES UR: NEGATIVE mg/dL
LEUKOCYTES UA: NEGATIVE
NITRITE: NEGATIVE
PROTEIN: NEGATIVE mg/dL
Specific Gravity, Urine: 1.039 — ABNORMAL HIGH (ref 1.005–1.030)
UROBILINOGEN UA: 1 mg/dL (ref 0.0–1.0)
pH: 5.5 (ref 5.0–8.0)

## 2013-08-21 LAB — POCT I-STAT 3, ART BLOOD GAS (G3+)
Acid-Base Excess: 1 mmol/L (ref 0.0–2.0)
BICARBONATE: 26.3 meq/L — AB (ref 20.0–24.0)
O2 Saturation: 98 %
PCO2 ART: 42.9 mmHg (ref 35.0–45.0)
PH ART: 7.396 (ref 7.350–7.450)
TCO2: 28 mmol/L (ref 0–100)
pO2, Arterial: 102 mmHg — ABNORMAL HIGH (ref 80.0–100.0)

## 2013-08-21 LAB — GLUCOSE, CAPILLARY
GLUCOSE-CAPILLARY: 190 mg/dL — AB (ref 70–99)
Glucose-Capillary: 171 mg/dL — ABNORMAL HIGH (ref 70–99)
Glucose-Capillary: 130 mg/dL — ABNORMAL HIGH (ref 70–99)
Glucose-Capillary: 179 mg/dL — ABNORMAL HIGH (ref 70–99)

## 2013-08-21 LAB — HEMOGLOBIN A1C
Hgb A1c MFr Bld: 7.1 % — ABNORMAL HIGH (ref <5.7)
Mean Plasma Glucose: 157 mg/dL — ABNORMAL HIGH (ref <117)

## 2013-08-21 LAB — MAGNESIUM: Magnesium: 2.2 mg/dL (ref 1.5–2.5)

## 2013-08-21 LAB — TROPONIN I: Troponin I: 0.37 ng/mL (ref <0.30)

## 2013-08-21 LAB — HEPARIN LEVEL (UNFRACTIONATED): HEPARIN UNFRACTIONATED: 0.12 [IU]/mL — AB (ref 0.30–0.70)

## 2013-08-21 LAB — TSH: TSH: 1.592 u[IU]/mL (ref 0.350–4.500)

## 2013-08-21 SURGERY — LEFT AND RIGHT HEART CATHETERIZATION WITH CORONARY ANGIOGRAM
Anesthesia: LOCAL

## 2013-08-21 MED ORDER — POTASSIUM CHLORIDE CRYS ER 20 MEQ PO TBCR
40.0000 meq | EXTENDED_RELEASE_TABLET | Freq: Once | ORAL | Status: AC
  Administered 2013-08-21: 40 meq via ORAL
  Filled 2013-08-21: qty 2

## 2013-08-21 MED ORDER — ACETAMINOPHEN 325 MG PO TABS: 650.0000 mg | ORAL_TABLET | ORAL | Status: DC | PRN

## 2013-08-21 MED ORDER — FENTANYL CITRATE 0.05 MG/ML IJ SOLN
INTRAMUSCULAR | Status: AC
  Filled 2013-08-21: qty 2

## 2013-08-21 MED ORDER — HEPARIN BOLUS VIA INFUSION
2550.0000 [IU] | Freq: Once | INTRAVENOUS | Status: AC
  Administered 2013-08-21: 2550 [IU] via INTRAVENOUS
  Filled 2013-08-21: qty 2550

## 2013-08-21 MED ORDER — HEPARIN (PORCINE) IN NACL 100-0.45 UNIT/ML-% IJ SOLN
2400.0000 [IU]/h | INTRAMUSCULAR | Status: DC
  Administered 2013-08-22: 2100 [IU]/h via INTRAVENOUS
  Administered 2013-08-22 – 2013-08-23 (×2): 2400 [IU]/h via INTRAVENOUS
  Filled 2013-08-21 (×5): qty 250

## 2013-08-21 MED ORDER — HYDROCODONE-ACETAMINOPHEN 5-325 MG PO TABS
1.0000 | ORAL_TABLET | Freq: Four times a day (QID) | ORAL | Status: DC | PRN
  Administered 2013-08-21: 2 via ORAL
  Administered 2013-08-22: 1 via ORAL
  Administered 2013-08-22: 2 via ORAL
  Administered 2013-08-22 – 2013-08-23 (×2): 1 via ORAL
  Administered 2013-08-23: 2 via ORAL
  Administered 2013-08-24: 1 via ORAL
  Administered 2013-08-24: 2 via ORAL
  Administered 2013-08-24: 1 via ORAL
  Administered 2013-08-25: 2 via ORAL
  Administered 2013-08-25: 1 via ORAL
  Administered 2013-08-26 – 2013-08-27 (×6): 2 via ORAL
  Filled 2013-08-21 (×2): qty 1
  Filled 2013-08-21 (×3): qty 2
  Filled 2013-08-21 (×2): qty 1
  Filled 2013-08-21 (×5): qty 2
  Filled 2013-08-21: qty 1
  Filled 2013-08-21 (×3): qty 2
  Filled 2013-08-21: qty 1
  Filled 2013-08-21: qty 2

## 2013-08-21 MED ORDER — ALPRAZOLAM 0.5 MG PO TABS
0.5000 mg | ORAL_TABLET | Freq: Once | ORAL | Status: AC
  Administered 2013-08-21: 0.5 mg via ORAL
  Filled 2013-08-21: qty 1

## 2013-08-21 MED ORDER — VERAPAMIL HCL 2.5 MG/ML IV SOLN
INTRAVENOUS | Status: AC
  Filled 2013-08-21: qty 2

## 2013-08-21 MED ORDER — METOPROLOL TARTRATE 25 MG/10 ML ORAL SUSPENSION
12.5000 mg | Freq: Two times a day (BID) | ORAL | Status: DC
  Administered 2013-08-21: 12.5 mg via ORAL
  Filled 2013-08-21 (×2): qty 5

## 2013-08-21 MED ORDER — SODIUM CHLORIDE 0.9 % IV SOLN: INTRAVENOUS | Status: AC

## 2013-08-21 MED ORDER — HEPARIN (PORCINE) IN NACL 2-0.9 UNIT/ML-% IJ SOLN
INTRAMUSCULAR | Status: AC
Start: 2013-08-21 — End: 2013-08-21
  Filled 2013-08-21: qty 1500

## 2013-08-21 MED ORDER — NITROGLYCERIN 0.2 MG/ML ON CALL CATH LAB
INTRAVENOUS | Status: AC
  Filled 2013-08-21: qty 1

## 2013-08-21 MED ORDER — LIDOCAINE HCL (PF) 1 % IJ SOLN
INTRAMUSCULAR | Status: AC
  Filled 2013-08-21: qty 30

## 2013-08-21 MED ORDER — METOPROLOL TARTRATE 12.5 MG HALF TABLET
12.5000 mg | ORAL_TABLET | Freq: Two times a day (BID) | ORAL | Status: DC
  Administered 2013-08-21 – 2013-08-24 (×6): 12.5 mg via ORAL
  Filled 2013-08-21 (×7): qty 1

## 2013-08-21 MED ORDER — HEPARIN SODIUM (PORCINE) 1000 UNIT/ML IJ SOLN
INTRAMUSCULAR | Status: AC
  Filled 2013-08-21: qty 1

## 2013-08-21 MED ORDER — MIDAZOLAM HCL 2 MG/2ML IJ SOLN
INTRAMUSCULAR | Status: AC
  Filled 2013-08-21: qty 2

## 2013-08-21 MED ORDER — HEPARIN (PORCINE) IN NACL 100-0.45 UNIT/ML-% IJ SOLN: 1800.0000 [IU]/h | INTRAMUSCULAR | Status: DC

## 2013-08-21 MED ORDER — MAGNESIUM HYDROXIDE 400 MG/5ML PO SUSP
30.0000 mL | Freq: Every evening | ORAL | Status: DC | PRN
  Filled 2013-08-21: qty 30

## 2013-08-21 NOTE — Progress Notes (Signed)
Utilization Review Completed.  

## 2013-08-21 NOTE — Progress Notes (Signed)
ANTICOAGULATION CONSULT NOTE - Follow Up Consult  Pharmacy Consult for Heparin Indication: NSTEMI  Allergies  Allergen Reactions  . Sulfa Antibiotics Nausea Only    Patient Measurements: Height: 5\' 6"  (167.6 cm) Weight: 220 lb 0.3 oz (99.8 kg) IBW/kg (Calculated) : 63.8 Heparin Dosing Weight: 85 kg   Vital Signs: Temp: 98.4 F (36.9 C) (02/13 0821) Temp src: Oral (02/13 0821) BP: 118/63 mmHg (02/13 0821) Pulse Rate: 86 (02/13 0821)  Labs:  Recent Labs  08/17/2013 1146 08/31/2013 1247 08/16/2013 1351 08/23/2013 1438 08/19/2013 2130 08/11/2013 2230 08/27/2013 0330 08/13/2013 0915  HGB 11.1* 12.6*  --   --   --   --  10.4*  --   HCT 32.9* 37.0*  --   --   --   --  30.6*  --   PLT 157  --   --   --   --   --  143*  --   APTT  --   --  26  --   --   --   --   --   LABPROT  --   --  14.0  --   --   --   --   --   INR  --   --  1.10  --   --   --   --   --   HEPARINUNFRC  --   --   --   --   --  <0.10*  --  0.12*  CREATININE 1.70* 1.80*  --   --   --   --  1.51*  --   TROPONINI  --   --   --  0.74* 0.63*  --  0.37*  --     Estimated Creatinine Clearance: 47.5 ml/min (by C-G formula based on Cr of 1.51).   Medications:  Heparin at 1500 units/hr  Assessment: 73 yo M admitted for NSTEMI planned to get Cath procedure today at 13:00. Heparin level sub-therapeutic at 0.12 despite rate increase last night. Drop in Hgb noted (12.6-->10.4), platelets stable, no bleeding reported.  Goal of Therapy:  Heparin level 0.3-0.7 units/ml Monitor platelets by anticoagulation protocol: Yes   Plan:  Re-bolus with heparin 2500 units x1  Increase gtt by 4 units/kg/hr to1800 units/hr  F/u after cath   Zannie Cove, PharmD Candidate [employer]  08/27/2013,10:11 AM    I agree with the above.  Nena Jordan, PharmD, BCPS 09/04/2013, 10:33 AM

## 2013-08-21 NOTE — Interval H&P Note (Signed)
History and Physical Interval Note:  08/24/2013 11:58 AM The case and indication for procedure was reviewed with Dr. Burt Knack. We will do coronary only left heart catheterization and right heart catheterization for pressure measurement. We will not perform ventriculogram and try across the aortic valve. The patient's chart has been reviewed. He is at high risk for kidney injury because of the pre-existing renal insufficiency. I rediscussed the procedure and indications with the patient who is in agreement with proceeding. He does understand the potential risk of kidney injury, bleeding, death, myocardial infarction, emergency surgery, stroke, and other. Anthony Turner  has presented today for surgery, with the diagnosis of Chest pain  The various methods of treatment have been discussed with the patient and family. After consideration of risks, benefits and other options for treatment, the patient has consented to  Procedure(s): LEFT AND RIGHT HEART CATHETERIZATION WITH CORONARY ANGIOGRAM (N/A) as a surgical intervention .  The patient's history has been reviewed, patient examined, no change in status, stable for surgery.  I have reviewed the patient's chart and labs.  Questions were answered to the patient's satisfaction.     Sinclair Grooms

## 2013-08-21 NOTE — Progress Notes (Signed)
ANTICOAGULATION CONSULT NOTE - Follow Up Consult  Pharmacy Consult for heparin  Indication: Significant CAD   Allergies  Allergen Reactions  . Sulfa Antibiotics Nausea Only    Patient Measurements: Height: 5\' 6"  (167.6 cm) Weight: 220 lb 0.3 oz (99.8 kg) IBW/kg (Calculated) : 63.8 Heparin Dosing Weight: 85  Vital Signs: Temp: 98.4 F (36.9 C) (02/13 0821) Temp src: Oral (02/13 0821) BP: 108/34 mmHg (02/13 1430) Pulse Rate: 73 (02/13 1430)  Labs:  Recent Labs  08/29/2013 1146 08/27/2013 1247 09/05/2013 1351 08/13/2013 1438 08/09/2013 2130 08/13/2013 2230 08/26/2013 0330 08/19/2013 0915  HGB 11.1* 12.6*  --   --   --   --  10.4*  --   HCT 32.9* 37.0*  --   --   --   --  30.6*  --   PLT 157  --   --   --   --   --  143*  --   APTT  --   --  26  --   --   --   --   --   LABPROT  --   --  14.0  --   --   --   --   --   INR  --   --  1.10  --   --   --   --   --   HEPARINUNFRC  --   --   --   --   --  <0.10*  --  0.12*  CREATININE 1.70* 1.80*  --   --   --   --  1.51*  --   TROPONINI  --   --   --  0.74* 0.63*  --  0.37*  --     Estimated Creatinine Clearance: 47.5 ml/min (by C-G formula based on Cr of 1.51).   Medications:  Heparin infusion at 1800 units/hr prior to cath and held for procedure   Assessment: 73 yo M s/p LHC/RHC found to have significant CAD. Plan to medically manage patient until resolution of fever and flank pain then consider re-cath/PCI.   Goal of Therapy:  Heparin level 0.3-0.7 units/ml Monitor platelets by anticoagulation protocol: Yes   Plan:  1)Resume heparin infusion at previous rate of 1800 units/hr to begin at 2100 (8h after sheath removal, sheath removed at 1257) 2)Check daily heparin level   Zannie Cove, PharmD Candidate [place]  08/30/2013,3:14 PM  I agree with the above.  Nena Jordan, PharmD, BCPS 08/25/2013, 3:25 PM

## 2013-08-21 NOTE — H&P (View-Only) (Signed)
    Subjective:  Chest pain overnight, subsided after NTG. Dyspnea persists. Fever overnight noted. Resolved with tylenol. Blood cx's drawn. No cough, sinus symptoms, urinary symptoms. Had some LLQ discomfort on exam. Not tender on exam.  Objective:  Vital Signs in the last 24 hours: Temp:  [98.2 F (36.8 C)-101.6 F (38.7 C)] 99.7 F (37.6 C) (02/13 0444) Pulse Rate:  [79-106] 82 (02/13 0700) Resp:  [18-34] 18 (02/12 1932) BP: (92-150)/(38-81) 114/59 mmHg (02/13 0444) SpO2:  [93 %-100 %] 99 % (02/13 0805) Weight:  [212 lb 8.4 oz (96.4 kg)-220 lb 0.3 oz (99.8 kg)] 220 lb 0.3 oz (99.8 kg) (02/13 0500)  Intake/Output from previous day: 02/12 0701 - 02/13 0700 In: 1673 [P.O.:250; I.V.:1423] Out: 200 [Urine:200]  Physical Exam: Pt is alert and oriented, obese male in NAD HEENT: normal Neck: JVP - normal Lungs: scattered rhonchi bilaterally CV: RRR with grade 2/6 systolic murmur at the RUSB Abd: soft, NT, Positive BS, no hepatomegaly Ext: no C/C/E, distal pulses intact and equal Skin: warm/dry no rash   Lab Results:  Recent Labs  08/27/2013 1146 08/13/2013 1247 08/11/2013 0330  WBC 12.8*  --  12.2*  HGB 11.1* 12.6* 10.4*  PLT 157  --  143*    Recent Labs  08/22/2013 1146 08/15/2013 1247 08/17/2013 0330  NA 140 138 137  K 3.8 3.5* 3.3*  CL 98 99 97  CO2 24  --  23  GLUCOSE 223* 219* 178*  BUN 31* 28* 31*  CREATININE 1.70* 1.80* 1.51*    Recent Labs  08/12/2013 2130 08/13/2013 0330  TROPONINI 0.63* 0.37*    Cardiac Studies: 2D Echo pending  Tele: Sinus rhythm  Assessment/Plan:  1. NSTEMI - on IV heparin and aspirin. Start low-dose beta-blocker. Anticipate cath today. Reviewed risks/benefits/alternatives with patient and family. Plan radial/brachial R/L heart cath and possible PCI.  2. Acute diastolic heart failure. Hold diuretics this morning in anticipation of cath/contrast exposure. Add low-dose metoprolol today. Holding losartan with cath today. Await echo and  right heart cath data.  3. COPD - chronic issue. Difficult to know how much of his sx's are related to COPD vs CHF. Will add BNP to labs. Body habitus makes exam difficult. Await cath data.  4. Fever - Blood cx's pending, on levaquin. No infiltrate on CXR.  Need urinalysis/cx. Flu negative at PCP office.  5. Aortic stenosis. Moderate by exam. Await echo.  6. Hyperlipidemia - on statin drug. Lipids drawn and at goal.  7. Acute renal insufficiency. Creatinine in 12/14 was 1.0, on admission 1.8, now down to 1.5 with IV fluids. With flank pain I'm going to order a CT urogram to rule out obstructive stone.  Dispo: CT urogram, right/left heart cath later today. Otherwise continue current Rx.   Sherren Mocha, M.D. 08/24/2013, 8:20 AM

## 2013-08-21 NOTE — Progress Notes (Signed)
    Subjective:  Chest pain overnight, subsided after NTG. Dyspnea persists. Fever overnight noted. Resolved with tylenol. Blood cx's drawn. No cough, sinus symptoms, urinary symptoms. Had some LLQ discomfort on exam. Not tender on exam.  Objective:  Vital Signs in the last 24 hours: Temp:  [98.2 F (36.8 C)-101.6 F (38.7 C)] 99.7 F (37.6 C) (02/13 0444) Pulse Rate:  [79-106] 82 (02/13 0700) Resp:  [18-34] 18 (02/12 1932) BP: (92-150)/(38-81) 114/59 mmHg (02/13 0444) SpO2:  [93 %-100 %] 99 % (02/13 0805) Weight:  [212 lb 8.4 oz (96.4 kg)-220 lb 0.3 oz (99.8 kg)] 220 lb 0.3 oz (99.8 kg) (02/13 0500)  Intake/Output from previous day: 02/12 0701 - 02/13 0700 In: 1673 [P.O.:250; I.V.:1423] Out: 200 [Urine:200]  Physical Exam: Pt is alert and oriented, obese male in NAD HEENT: normal Neck: JVP - normal Lungs: scattered rhonchi bilaterally CV: RRR with grade 2/6 systolic murmur at the RUSB Abd: soft, NT, Positive BS, no hepatomegaly Ext: no C/C/E, distal pulses intact and equal Skin: warm/dry no rash   Lab Results:  Recent Labs  08/30/2013 1146 09/05/2013 1247 08/16/2013 0330  WBC 12.8*  --  12.2*  HGB 11.1* 12.6* 10.4*  PLT 157  --  143*    Recent Labs  09/05/2013 1146 08/12/2013 1247 09/05/2013 0330  NA 140 138 137  K 3.8 3.5* 3.3*  CL 98 99 97  CO2 24  --  23  GLUCOSE 223* 219* 178*  BUN 31* 28* 31*  CREATININE 1.70* 1.80* 1.51*    Recent Labs  08/16/2013 2130 09/05/2013 0330  TROPONINI 0.63* 0.37*    Cardiac Studies: 2D Echo pending  Tele: Sinus rhythm  Assessment/Plan:  1. NSTEMI - on IV heparin and aspirin. Start low-dose beta-blocker. Anticipate cath today. Reviewed risks/benefits/alternatives with patient and family. Plan radial/brachial R/L heart cath and possible PCI.  2. Acute diastolic heart failure. Hold diuretics this morning in anticipation of cath/contrast exposure. Add low-dose metoprolol today. Holding losartan with cath today. Await echo and  right heart cath data.  3. COPD - chronic issue. Difficult to know how much of his sx's are related to COPD vs CHF. Will add BNP to labs. Body habitus makes exam difficult. Await cath data.  4. Fever - Blood cx's pending, on levaquin. No infiltrate on CXR.  Need urinalysis/cx. Flu negative at PCP office.  5. Aortic stenosis. Moderate by exam. Await echo.  6. Hyperlipidemia - on statin drug. Lipids drawn and at goal.  7. Acute renal insufficiency. Creatinine in 12/14 was 1.0, on admission 1.8, now down to 1.5 with IV fluids. With flank pain I'm going to order a CT urogram to rule out obstructive stone.  Dispo: CT urogram, right/left heart cath later today. Otherwise continue current Rx.   Sherren Mocha, M.D. 08/31/2013, 8:20 AM

## 2013-08-21 NOTE — Progress Notes (Signed)
Echo Lab  2D Echocardiogram completed.  Archer Moist L Filippa Yarbough, RDCS 08/27/2013 9:35 AM

## 2013-08-21 NOTE — CV Procedure (Signed)
     Left and Right Heart Catheterization with Coronary Angiography Report  Anthony Turner  73 y.o.  male 12-07-1940  Procedure Date: 08/14/2013  Referring Physician: Sherren Mocha, M.D. Primary Cardiologist:: Burt Knack, M.D.  INDICATIONS: Chest pain, fever, aortic stenosis, renal insufficiency, and diastolic heart failure.  PROCEDURE: 1. Right heart catheterization; 2. Left heart catheterization; 3. Coronary angiography  CONSENT:  The risks, benefits, and details of the procedure were explained in detail to the patient. Risks including death, stroke, heart attack, kidney injury, allergy, limb ischemia, bleeding and radiation injury were discussed.  The patient verbalized understanding and wanted to proceed.  Informed written consent was obtained.  PROCEDURE TECHNIQUE:  After Xylocaine anesthesia a 5 French slender sheath was placed in the right radial artery with a double wall needle stick.  A 5 French brachial sheath was placed in the right antecubital vein in exchange for a previously placed 19-gauge Angiocath. Both were performed using the Seldinger technique. Local Xylocaine infiltration was used. Coronary angiography was done using a 5 [place] JR 4 and JL 3.5 catheters.  Left ventriculography was not performed to conserve contrast.   Right heart catheterization including an oximetry sample from the main pulmonary artery was obtained.  Left heart catheterization included obtaining a central aortic oximetry sample. The aortic valve was crossed using the Judkins right diagnostic catheter. Left ventriculography was not performed.  Hemostasis was achieved in the right radial artery with a wristband. Manual compression of antecubital vein was used for hemostasis.  MEDICATIONS: Versed 0.5 mg IV and fentanyl 25 mcg IV    CONTRAST:  Total of 80 cc.  COMPLICATIONS:  None   HEMODYNAMICS:  Aortic pressure 96/53 mmHg; LV pressure 139/18 mmHg; LVEDP 27 mm mercury, RA 18 mmHg, RV 48/16 mmHg,  PA 44/28 mmHg, PCWP(mean) 26 mm mercury mean. No significant V wave is noted., Cardiac Output 5.51 L per minute, AV gradient  43 mm mercury peak to peak and 24 mm mercury mean; Aortic valve area 1.16 cm square  ANGIOGRAPHIC DATA:   The left main coronary artery is widely patent. Moderate calcification is noted..  The left anterior descending artery is heavily calcified. Eccentric moderate to severe mid stenosis is noted after the first diagonal. The regional most severe stenosis is graded at approximately 80%. 2 relatively large diagonal branches arise before the high-grade disease in the LAD.  The left circumflex artery is patent but contains 90% ostial stenosis with a hazy quality. The distal circumflex after the origin of the second obtuse marginal contains segmental 50-70% stenosis.  The right coronary artery is is widely patent and dominant. The proximal and mid vessel is heavily calcified. Irregularities in the mid vessel obstructed to up to 50%.  LEFT VENTRICULOGRAM:  Left ventricular angiogram was not performed   IMPRESSIONS:  1. Moderately severe to severe aortic stenosis, aortic valve area 1.16 cm square  2. Significant coronary artery disease with high-grade ostial circumflex and moderately severe mid LAD obstruction  3. Moderate pulmonary hypertension  4. Heart failure, suspect diastolic in nature. Echo is pending. End diastolic pressures and capillary wedge are significantly elevated     RECOMMENDATION:  Consideration of heart treatment options via a heart team approach. Issues with the patient's fever and left flank pain will need to be addressed prior to proceeding with further cardiac intervention. The patient's comorbidities including asbestosis may have some bearing on treatment options.

## 2013-08-22 ENCOUNTER — Inpatient Hospital Stay (HOSPITAL_COMMUNITY): Payer: Medicare Other

## 2013-08-22 DIAGNOSIS — J61 Pneumoconiosis due to asbestos and other mineral fibers: Secondary | ICD-10-CM | POA: Diagnosis present

## 2013-08-22 DIAGNOSIS — R161 Splenomegaly, not elsewhere classified: Secondary | ICD-10-CM | POA: Diagnosis present

## 2013-08-22 DIAGNOSIS — D649 Anemia, unspecified: Secondary | ICD-10-CM | POA: Diagnosis present

## 2013-08-22 DIAGNOSIS — M1990 Unspecified osteoarthritis, unspecified site: Secondary | ICD-10-CM | POA: Diagnosis present

## 2013-08-22 DIAGNOSIS — N200 Calculus of kidney: Secondary | ICD-10-CM | POA: Diagnosis present

## 2013-08-22 DIAGNOSIS — I7090 Unspecified atherosclerosis: Secondary | ICD-10-CM | POA: Diagnosis present

## 2013-08-22 DIAGNOSIS — I359 Nonrheumatic aortic valve disorder, unspecified: Secondary | ICD-10-CM

## 2013-08-22 DIAGNOSIS — R7881 Bacteremia: Secondary | ICD-10-CM

## 2013-08-22 DIAGNOSIS — I2510 Atherosclerotic heart disease of native coronary artery without angina pectoris: Secondary | ICD-10-CM | POA: Diagnosis present

## 2013-08-22 DIAGNOSIS — I5030 Unspecified diastolic (congestive) heart failure: Secondary | ICD-10-CM | POA: Diagnosis present

## 2013-08-22 LAB — GLUCOSE, CAPILLARY
GLUCOSE-CAPILLARY: 167 mg/dL — AB (ref 70–99)
Glucose-Capillary: 177 mg/dL — ABNORMAL HIGH (ref 70–99)
Glucose-Capillary: 174 mg/dL — ABNORMAL HIGH (ref 70–99)
Glucose-Capillary: 160 mg/dL — ABNORMAL HIGH (ref 70–99)

## 2013-08-22 LAB — CBC
HCT: 30.1 % — ABNORMAL LOW (ref 39.0–52.0)
HEMOGLOBIN: 10 g/dL — AB (ref 13.0–17.0)
MCH: 28.7 pg (ref 26.0–34.0)
MCHC: 33.2 g/dL (ref 30.0–36.0)
MCV: 86.2 fL (ref 78.0–100.0)
Platelets: 137 10*3/uL — ABNORMAL LOW (ref 150–400)
RBC: 3.49 MIL/uL — ABNORMAL LOW (ref 4.22–5.81)
RDW: 15.2 % (ref 11.5–15.5)
WBC: 13.8 10*3/uL — AB (ref 4.0–10.5)

## 2013-08-22 LAB — HEPARIN LEVEL (UNFRACTIONATED)
HEPARIN UNFRACTIONATED: 0.12 [IU]/mL — AB (ref 0.30–0.70)
HEPARIN UNFRACTIONATED: 0.48 [IU]/mL (ref 0.30–0.70)
Heparin Unfractionated: 0.17 IU/mL — ABNORMAL LOW (ref 0.30–0.70)

## 2013-08-22 MED ORDER — MIDAZOLAM HCL 2 MG/2ML IJ SOLN
2.0000 mg | Freq: Once | INTRAMUSCULAR | Status: AC
  Administered 2013-08-23: 2 mg via INTRAVENOUS
  Filled 2013-08-22: qty 2

## 2013-08-22 MED ORDER — VANCOMYCIN HCL IN DEXTROSE 750-5 MG/150ML-% IV SOLN
750.0000 mg | Freq: Once | INTRAVENOUS | Status: AC
  Administered 2013-08-22: 750 mg via INTRAVENOUS
  Filled 2013-08-22: qty 150

## 2013-08-22 MED ORDER — ONDANSETRON HCL 4 MG/2ML IJ SOLN
4.0000 mg | Freq: Four times a day (QID) | INTRAMUSCULAR | Status: DC | PRN
  Administered 2013-08-23 – 2013-08-26 (×6): 4 mg via INTRAVENOUS
  Filled 2013-08-22 (×6): qty 2

## 2013-08-22 MED ORDER — LACTULOSE 10 GM/15ML PO SOLN
30.0000 g | Freq: Every day | ORAL | Status: AC
  Administered 2013-08-22: 30 g via ORAL
  Filled 2013-08-22: qty 45

## 2013-08-22 MED ORDER — CEFAZOLIN SODIUM-DEXTROSE 2-3 GM-% IV SOLR
2.0000 g | Freq: Three times a day (TID) | INTRAVENOUS | Status: DC
  Administered 2013-08-22 – 2013-08-24 (×7): 2 g via INTRAVENOUS
  Filled 2013-08-22 (×10): qty 50

## 2013-08-22 MED ORDER — FUROSEMIDE 40 MG PO TABS
40.0000 mg | ORAL_TABLET | Freq: Every day | ORAL | Status: DC
  Administered 2013-08-22 – 2013-08-26 (×5): 40 mg via ORAL
  Filled 2013-08-22 (×6): qty 1

## 2013-08-22 MED ORDER — LEVALBUTEROL HCL 0.63 MG/3ML IN NEBU
0.6300 mg | INHALATION_SOLUTION | Freq: Four times a day (QID) | RESPIRATORY_TRACT | Status: DC | PRN
  Administered 2013-08-22 – 2013-08-25 (×4): 0.63 mg via RESPIRATORY_TRACT
  Filled 2013-08-22 (×3): qty 3

## 2013-08-22 MED ORDER — VANCOMYCIN HCL IN DEXTROSE 750-5 MG/150ML-% IV SOLN
750.0000 mg | Freq: Two times a day (BID) | INTRAVENOUS | Status: DC
  Administered 2013-08-22 – 2013-08-26 (×8): 750 mg via INTRAVENOUS
  Filled 2013-08-22 (×11): qty 150

## 2013-08-22 MED ORDER — ONDANSETRON HCL 4 MG/2ML IJ SOLN
INTRAMUSCULAR | Status: AC
  Administered 2013-08-22: 4 mg
  Filled 2013-08-22: qty 2

## 2013-08-22 NOTE — Progress Notes (Signed)
ANTIBIOTIC CONSULT NOTE - INITIAL  Pharmacy Consult for vancomycin Indication: bacteremia  Allergies  Allergen Reactions  . Sulfa Antibiotics Nausea Only    Patient Measurements: Height: 5\' 6"  (167.6 cm) Weight: 220 lb 0.3 oz (99.8 kg) IBW/kg (Calculated) : 63.8  Vital Signs: Temp: 98.5 F (36.9 C) (02/13 2359) Temp src: Axillary (02/13 2359) BP: 96/48 mmHg (02/13 2348) Pulse Rate: 68 (02/14 0000) Intake/Output from previous day: 02/13 0701 - 02/14 0700 In: 8527 [P.O.:270; I.V.:1053] Out: 600 [Urine:600] Intake/Output from this shift: Total I/O In: 447 [P.O.:150; I.V.:297] Out: 150 [Urine:150]  Labs:  Recent Labs  09/04/2013 1146 08/31/2013 1247 08/16/2013 0330  WBC 12.8*  --  12.2*  HGB 11.1* 12.6* 10.4*  PLT 157  --  143*  CREATININE 1.70* 1.80* 1.51*   Estimated Creatinine Clearance: 47.5 ml/min (by C-G formula based on Cr of 1.51).   Microbiology: Recent Results (from the past 720 hour(s))  MRSA PCR SCREENING     Status: None   Collection Time    08/19/2013  4:12 PM      Result Value Ref Range Status   MRSA by PCR NEGATIVE  NEGATIVE Final   Comment:            The GeneXpert MRSA Assay (FDA     approved for NASAL specimens     only), is one component of a     comprehensive MRSA colonization     surveillance program. It is not     intended to diagnose MRSA     infection nor to guide or     monitor treatment for     MRSA infections.  CULTURE, BLOOD (ROUTINE X 2)     Status: None   Collection Time    09/04/2013  3:30 AM      Result Value Ref Range Status   Specimen Description BLOOD LEFT HAND   Final   Special Requests BOTTLES DRAWN AEROBIC AND ANAEROBIC 10CC EACH   Final   Culture  Setup Time     Final   Value: 08/27/2013 08:25     Performed at [facility]   Culture     Final   Value: GRAM POSITIVE COCCI IN CLUSTERS     Note: Gram Stain Report Called to,Read Back By and Verified With: CHRIS BUNGQUE ON 08/22/2013 AT 12:49A BY WILEJ     Performed  at [facility]   Report Status PENDING   Incomplete  CULTURE, BLOOD (ROUTINE X 2)     Status: None   Collection Time    08/19/2013  3:36 AM      Result Value Ref Range Status   Specimen Description BLOOD LEFT HAND   Final   Special Requests BOTTLES DRAWN AEROBIC AND ANAEROBIC 10CC EACH   Final   Culture  Setup Time     Final   Value: 08/19/2013 08:25     Performed at [facility]   Culture     Final   Value: GRAM POSITIVE COCCI IN CLUSTERS     Note: Gram Stain Report Called to,Read Back By and Verified With: CHRIS BUNGQUE ON 08/22/2013 AT 12:49A BY Dennard Nip     Performed at [facility]   Report Status PENDING   Incomplete    Medical History: Past Medical History  Diagnosis Date  . COPD (chronic obstructive pulmonary disease)   . Type 2 diabetes mellitus, controlled   . Asthma   . Arthritis     with DIP nodules  .  History of diverticulitis of colon   . Seasonal allergic rhinitis   . Aortic stenosis     mod by last echo (04/2013) with EF 55%  . HTN (hypertension)   . HLD (hyperlipidemia)   . 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 Asbestos exposure     Medications:  Prescriptions prior to admission  Medication Sig Dispense Refill  . acetaminophen (TYLENOL) 500 MG tablet Take 1,000 mg by mouth every 6 (six) hours as needed for mild pain.      Marland Kitchen albuterol (PROVENTIL HFA;VENTOLIN HFA) 108 (90 BASE) MCG/ACT inhaler Inhale 2 puffs into the lungs 3 (three) times daily.      Marland Kitchen amLODipine (NORVASC) 5 MG tablet Take 2.5 mg by mouth daily.       Marland Kitchen aspirin 81 MG tablet Take 81 mg by mouth daily.      . budesonide-formoterol (SYMBICORT) 160-4.5 MCG/ACT inhaler Inhale 2 puffs into the lungs 2 (two) times daily.  1 Inhaler  3  . Docusate Calcium (STOOL SOFTENER PO) Take 1 capsule by mouth 2 (two) times daily.       . furosemide (LASIX) 20 MG tablet Take 20-60 mg by mouth daily as needed for fluid.      . hydrochlorothiazide  (HYDRODIURIL) 25 MG tablet Take 25 mg by mouth daily.      Marland Kitchen ibuprofen (ADVIL,MOTRIN) 200 MG tablet Take 400 mg by mouth every 6 (six) hours as needed.      Marland Kitchen levofloxacin (LEVAQUIN) 500 MG tablet Take 1 tablet (500 mg total) by mouth daily.  7 tablet  0  . losartan (COZAAR) 100 MG tablet Take 100 mg by mouth daily.      Marland Kitchen NAPROXEN PO Take 2 tablets by mouth every 6 (six) hours as needed (fever/ pain).      . potassium chloride SA (K-DUR,KLOR-CON) 20 MEQ tablet Take 20 mEq by mouth daily.      . pravastatin (PRAVACHOL) 80 MG tablet Take 80 mg by mouth daily.      Marland Kitchen tiotropium (SPIRIVA) 18 MCG inhalation capsule Place 18 mcg into inhaler and inhale daily.      . vitamin B-12 (CYANOCOBALAMIN) 1000 MCG tablet Take 1,000 mcg by mouth daily.      Marland Kitchen nystatin (MYCOSTATIN) 100000 UNIT/ML suspension Take 5 mLs (500,000 Units total) by mouth 4 (four) times daily.  60 mL  0   Scheduled:  . aspirin EC  81 mg Oral Daily  . budesonide-formoterol  2 puff Inhalation BID  . docusate sodium  100 mg Oral Daily  . insulin aspart  0-9 Units Subcutaneous TID WC  . levofloxacin  500 mg Oral Daily  . metoprolol tartrate  12.5 mg Oral BID  . nystatin  5 mL Oral QID  . simvastatin  40 mg Oral q1800  . tiotropium  18 mcg Inhalation Daily   Infusions:  . heparin 1,800 Units/hr (08/29/2013 2100)    Assessment: 73yo male admitted 2/12 for NSTEMI now w/ GPC in clusters on blood Cx, to begin IV ABX.  Goal of Therapy:  Vancomycin trough level 15-20 mcg/ml  Plan:  Will begin vancomycin 750mg  IV Q12H and monitor CBC, Cx, levels prn.  Wynona Neat, PharmD, BCPS  08/22/2013,1:22 AM

## 2013-08-22 NOTE — Consult Note (Signed)
[facility]    Date of Admission:  08/10/2013           Day 4 levofloxacin        Day 1 vancomycin       Reason for Consult: Staph bacteremia    Referring Physician: Dr. Pierre Bali  Principal Problem:   Bacteremia due to Staphylococcus Active Problems:   Aortic stenosis   NSTEMI (non-ST elevated myocardial infarction)   COPD (chronic obstructive pulmonary disease) with emphysema   Diabetes mellitus type 2, diet-controlled   HTN (hypertension)   HLD (hyperlipidemia)   Elevated PSA   Heme positive stool   Acute renal failure   CAD (coronary artery disease)   Normocytic anemia   Diastolic CHF   Asbestosis   Atherosclerosis   Splenomegaly   Nephrolithiasis   DJD (degenerative joint disease)   . aspirin EC  81 mg Oral Daily  . budesonide-formoterol  2 puff Inhalation BID  . docusate sodium  100 mg Oral Daily  . furosemide  40 mg Oral Daily  . insulin aspart  0-9 Units Subcutaneous TID WC  . levofloxacin  500 mg Oral Daily  . metoprolol tartrate  12.5 mg Oral BID  . nystatin  5 mL Oral QID  . simvastatin  40 mg Oral q1800  . tiotropium  18 mcg Inhalation Daily  . vancomycin  750 mg Intravenous Q12H    Recommendations: 1. Continue vancomycin 2. Start cefazolin 3. Await final blood culture results   Assessment: He has staph bacteremia and is at high risk for aortic valve endocarditis given his aortic stenosis. I will treat him with IV cefazolin and vancomycin to give optimal coverage of methicillin sensitive and methicillin-resistant staph pending antibiotic susceptibility testing and final speciation. Although TEE is more sensitive for endocarditis, I doubt that the results would alter my recommendations for antibiotic therapy. Given the high-risk nature of this infection he is likely to need a minimum of 4 weeks of IV antibiotic therapy regardless of the results. I would wait on PICC placement until we know that repeat blood  cultures are negative. I will followup tomorrow.   HPI: Anthony Turner is a 73 y.o. male with multiple medical problems who began to feel poorly 6 days ago. He had sudden onset of fever and chills. He has had anorexia, nausea and abdominal bloating. He was seen on February 2 been started on levofloxacin. He felt a little bit better initially but then continued to have a high fever and chills leading to admission 2 days ago. He was found to have a NSTEMI and both sets of admission blood cultures were growing gram-positive cocci in clusters compatible with staph. He has aortic stenosis. No definite vegetation was seen on transthoracic echocardiogram. Vancomycin was started last night and blood cultures were repeated this morning. He and his family feel like he is a little bit better this morning. He has had intermittent pleuritic left flank pain for the past 3 years. He has noted that it has been worse over the past week.   Review of Systems: Constitutional: positive for anorexia, chills and fevers, negative for sweats and weight loss Eyes: negative Ears, nose, mouth, throat, and face: negative Respiratory: positive for dyspnea on exertion and pleurisy/chest pain, negative for cough, hemoptysis and sputum Cardiovascular: negative Gastrointestinal: negative Genitourinary:positive for he passed a small kidney stone without any pain or hematuria weeks ago, negative for dysuria and hematuria Integument/breast: positive  for he saw a dermatologist several weeks ago and had multiple skin lesions frozen, negative for rash  Past Medical History  Diagnosis Date  . COPD (chronic obstructive pulmonary disease)   . Type 2 diabetes mellitus, controlled   . Asthma   . Arthritis     with DIP nodules  . History of diverticulitis of colon   . Seasonal allergic rhinitis   . Aortic stenosis     mod by last echo (04/2013) with EF 55%  . HTN (hypertension)   . HLD (hyperlipidemia)   . Lung disease     asbestos  related pleural disease by last CT scan 07/2013  . Asbestos exposure     History  Substance Use Topics  . Smoking status: Former Smoker -- 4.00 packs/day for 35 years    Types: Cigarettes    Quit date: 02/21/1982  . Smokeless tobacco: Never Used  . Alcohol Use: Yes     Comment: Occasional    Family History  Problem Relation Age of Onset  . Alcohol abuse Father    Allergies  Allergen Reactions  . Sulfa Antibiotics Nausea Only    OBJECTIVE: Blood pressure 91/56, pulse 64, temperature 98.2 F (36.8 C), temperature source Oral, resp. rate 27, height 5\' 6"  (1.676 m), weight 98.8 kg (217 lb 13 oz), SpO2 98.00%. General: He is alert and in no distress talking with family Skin: Scattered moles and what appears to be actinic keratoses. No splinter or conjunctival hemorrhages Lungs: Clear Cor: Regular S1 and S2 with a 2/6 systolic murmur heard best at the right upper sternal border Abdomen: Soft and nontender Joints and extremities: Changes of DJD but no acute abnormalities  Lab Results Lab Results  Component Value Date   WBC 13.8* 08/22/2013   HGB 10.0* 08/22/2013   HCT 30.1* 08/22/2013   MCV 86.2 08/22/2013   PLT 137* 08/22/2013    Lab Results  Component Value Date   CREATININE 1.51* 08/27/2013   BUN 31* 08/11/2013   NA 137 08/11/2013   K 3.3* 08/15/2013   CL 97 08/27/2013   CO2 23 08/27/2013    Lab Results  Component Value Date   ALT 51 06/23/2013   AST 45* 06/23/2013   ALKPHOS 81 06/23/2013   BILITOT 0.5 06/23/2013     Microbiology: Recent Results (from the past 240 hour(s))  MRSA PCR SCREENING     Status: None   Collection Time    08/31/2013  4:12 PM      Result Value Ref Range Status   MRSA by PCR NEGATIVE  NEGATIVE Final   Comment:            The GeneXpert MRSA Assay (FDA     approved for NASAL specimens     only), is one component of a     comprehensive MRSA colonization     surveillance program. It is not     intended to diagnose MRSA     infection nor to  guide or     monitor treatment for     MRSA infections.  CULTURE, BLOOD (ROUTINE X 2)     Status: None   Collection Time    09/03/2013  3:30 AM      Result Value Ref Range Status   Specimen Description BLOOD LEFT HAND   Final   Special Requests BOTTLES DRAWN AEROBIC AND ANAEROBIC 10CC EACH   Final   Culture  Setup Time     Final   Value: 08/17/2013 08:25  Performed at [facility]     Final   Value: GRAM POSITIVE COCCI IN CLUSTERS     Note: Gram Stain Report Called to,Read Back By and Verified With: CHRIS BUNGQUE ON 08/22/2013 AT 12:49A BY WILEJ     Performed at [facility]   Report Status PENDING   Incomplete  CULTURE, BLOOD (ROUTINE X 2)     Status: None   Collection Time    08/15/2013  3:36 AM      Result Value Ref Range Status   Specimen Description BLOOD LEFT HAND   Final   Special Requests BOTTLES DRAWN AEROBIC AND ANAEROBIC 10CC EACH   Final   Culture  Setup Time     Final   Value: 08/22/2013 08:25     Performed at [facility]   Culture     Final   Value: [place] IN CLUSTERS     Note: Gram Stain Report Called to,Read Back By and Verified With: CHRIS BUNGQUE ON 08/22/2013 AT 12:49A BY Dennard Nip     Performed at [facility]   Report Status PENDING   Incomplete    Michel Bickers, [facility] [phone] pager   [phone] cell 08/22/2013, 3:32 PM

## 2013-08-22 NOTE — Progress Notes (Signed)
ANTICOAGULATION CONSULT NOTE - Follow Up Consult  Pharmacy Consult for heparin Indication: CAD  Labs:  Recent Labs  09/05/2013 1146 08/31/2013 1247 08/16/2013 1351 08/19/2013 1438 08/18/2013 2130 09/03/2013 2230 08/27/2013 0330 08/27/2013 0915 08/22/13 0300  HGB 11.1* 12.6*  --   --   --   --  10.4*  --  10.0*  HCT 32.9* 37.0*  --   --   --   --  30.6*  --  30.1*  PLT 157  --   --   --   --   --  143*  --  137*  APTT  --   --  26  --   --   --   --   --   --   LABPROT  --   --  14.0  --   --   --   --   --   --   INR  --   --  1.10  --   --   --   --   --   --   HEPARINUNFRC  --   --   --   --   --  <0.10*  --  0.12* 0.12*  CREATININE 1.70* 1.80*  --   --   --   --  1.51*  --   --   TROPONINI  --   --   --  0.74* 0.63*  --  0.37*  --   --     Assessment: 72yo male subtherapeutic on heparin after resumed post-cath, now awaiting intervention.  Goal of Therapy:  Heparin level 0.3-0.7 units/ml   Plan:  Will increase heparin gtt by 3 units/kg/hr to 2100 units/hr and check level in [place], PharmD, BCPS  08/22/2013,3:43 AM

## 2013-08-22 NOTE — Progress Notes (Signed)
ANTICOAGULATION CONSULT NOTE - Follow Up Consult  Pharmacy Consult for heparin Indication: CAD  Labs:  Recent Labs  08/12/2013 1146 08/23/2013 1247 08/27/2013 1351 08/25/2013 1438 08/25/2013 2130  08/25/2013 0330  08/22/13 0300 08/22/13 1135 08/22/13 2130  HGB 11.1* 12.6*  --   --   --   --  10.4*  --  10.0*  --   --   HCT 32.9* 37.0*  --   --   --   --  30.6*  --  30.1*  --   --   PLT 157  --   --   --   --   --  143*  --  137*  --   --   APTT  --   --  26  --   --   --   --   --   --   --   --   LABPROT  --   --  14.0  --   --   --   --   --   --   --   --   INR  --   --  1.10  --   --   --   --   --   --   --   --   HEPARINUNFRC  --   --   --   --   --   < >  --   < > 0.12* 0.17* 0.48  CREATININE 1.70* 1.80*  --   --   --   --  1.51*  --   --   --   --   TROPONINI  --   --   --  0.74* 0.63*  --  0.37*  --   --   --   --   < > = values in this interval not displayed.  Assessment: 73yo male subtherapeutic on heparin resumed post-cath, now awaiting intervention. HL is now therapeutic after increase at 0.48. Per nurse, no interruptions in gtt and no noted s/s bleeding. H/H, plt trending down, will continue to monitor.   Goal of Therapy:  Heparin level 0.3-0.7 units/ml   Plan:  - Continue heparin gtt to 2400 units/hr - F/u 0500 level  - Daily HL, CBC, s/s bleeding  Albertina Parr, PharmD.  Clinical Pharmacist Pager [phone]

## 2013-08-22 NOTE — Progress Notes (Signed)
CRITICAL VALUE ALERT  Critical value received:  Gram pos.cocci  Date of notification: 08/22/13  Time of notification:  0100  Critical value read back:yes  Nurse who received alert:  Deloris Ping   MD notified (1st page):  Yousuf  Time of first page:  0115  MD notified (2nd page):  Time of second page:  Responding MD:    Time MD responded:

## 2013-08-22 NOTE — Progress Notes (Addendum)
Subjective: Anthony Turner is a 73 yo man pmh AS, CAD, asbestosis, COPD, p/w NSTEMI on 08/25/2013. Trop peak (0.74).   LHC on 08/15/2013: mod-severe AS (1.16cm squared valve area), hig-grade ostial circumflex, mod-severe mid LAD, pulm HTN with elevated end diastolic pressures.   ECHO: EF 55-60%. Moderate AS (mean 29). Normal RV Objective: Vital signs in last 24 hours: Filed Vitals:   08/22/13 0700 08/22/13 0750 08/22/13 0837 08/22/13 0900  BP:  114/53    Pulse: 80 82  85  Temp:  99.3 F (37.4 C)    TempSrc:  Oral    Resp:      Height:      Weight:      SpO2: 98% 100% 96%    Weight change: -3 oz (-0.084 kg)  Intake/Output Summary (Last 24 hours) at 08/22/13 0936 Last data filed at 08/22/13 0800  Gross per 24 hour  Intake   1952 ml  Output   1150 ml  Net    802 ml   General: sitting in chair NAD HEENT: PERRL, EOMI, no scleral icterus Cardiac: RRR, no WJXB,1/4 systolic murmur at RSB, no gallops Pulm: markedly decreased BS throughout. Bibasilar crackles Abd: obese soft, nontender, nondistended, BS present Ext: warm and well perfused, tr pedal edema Neuro: alert and oriented X3, cranial nerves II-XII grossly intact  Lab Results: Basic Metabolic Panel:  Recent Labs Lab 08/27/2013 1146 08/27/2013 1247 08/23/2013 1900 08/12/2013 0330 08/25/2013 0915  NA 140 138  --  137  --   K 3.8 3.5*  --  3.3*  --   CL 98 99  --  97  --   CO2 24  --   --  23  --   GLUCOSE 223* 219*  --  178*  --   BUN 31* 28*  --  31*  --   CREATININE 1.70* 1.80*  --  1.51*  --   CALCIUM 8.8  --   --  8.3*  --   MG  --   --  2.2  --  2.2   CBC:  Recent Labs Lab 08/19/2013 0330 08/22/13 0300  WBC 12.2* 13.8*  HGB 10.4* 10.0*  HCT 30.6* 30.1*  MCV 85.2 86.2  PLT 143* 137*   Cardiac Enzymes:  Recent Labs Lab 09/04/2013 1438 09/04/2013 2130 08/16/2013 0330  TROPONINI 0.74* 0.63* 0.37*   BNP:  Recent Labs Lab 08/16/2013 1146  PROBNP 679.3*   CBG:  Recent Labs Lab 09/05/2013 1752 08/11/2013 2130  08/14/2013 0805 08/19/2013 1319 08/21/2013 1650 08/27/2013 2127  GLUCAP 165* 167* 171* 130* 190* 179*   Hemoglobin A1C:  Recent Labs Lab 08/13/2013 1900  HGBA1C 7.1*   Fasting Lipid Panel:  Recent Labs Lab 08/11/2013 0330  CHOL 115  HDL 17*  LDLCALC 63  TRIG 173*  CHOLHDL 6.8   Thyroid Function Tests:  Recent Labs Lab 08/22/2013 1900  TSH 1.592   Coagulation:  Recent Labs Lab 08/15/2013 1351  LABPROT 14.0  INR 1.10   Cardiac Studies: Echo 10/14: report not available [facility]  Echo 2/15: EF 78-29%, grade 2 diastolic dysfunction, mod AS  Medications: I have reviewed the patient's current medications. Scheduled Meds: . aspirin EC  81 mg Oral Daily  . budesonide-formoterol  2 puff Inhalation BID  . docusate sodium  100 mg Oral Daily  . insulin aspart  0-9 Units Subcutaneous TID WC  . levofloxacin  500 mg Oral Daily  . metoprolol tartrate  12.5 mg Oral BID  . nystatin  5 mL  Oral QID  . simvastatin  40 mg Oral q1800  . tiotropium  18 mcg Inhalation Daily  . vancomycin  750 mg Intravenous Q12H   Continuous Infusions: . heparin 2,100 Units/hr (08/22/13 0541)   PRN Meds:.HYDROcodone-acetaminophen, LORazepam, magnesium hydroxide, nitroGLYCERIN, zolpidem Assessment/Plan: 1. NSTEMI w/Severe 2v CAD on cath- ASA, heparin, lopressor.  LHC on 08/19/2013 showed pulm htn,   2. Acute diastolic heart failure. LVEDP 27 on cath. Low-dose metoprolol started 08/31/2013.   3. COPD/asbestosis - chronic issue. Difficult to know how much of his sx's are related to COPD vs CHF. Will add BNP to labs. Body habitus makes exam difficult.   4. G+ cocci bactermia - previously on levaquin. No infiltrate on CXR. Urinalysis negative (but obtained after levofloxacin started). Flu negative at PCP office. Addition of vanc on 08/22/13.    5. Aortic stenosis. Moderate- severe by LHC. .   6. Hyperlipidemia - on statin drug. Lipids drawn and at goal.   7. Acute renal insufficiency.  1.8 (baseline 1.0) >>> 1.5. CT  abd no obstruction or acute intra-abdominal pathology.   8. DM2: SSI, HgbA1c 7.1  9. Left flank pain. - unclear etiology. Mild perinephric stranding on ab CT. ?pyelo but UA ok.    LOS: 2 days   Clinton Gallant, MD 08/22/2013, 9:36 AM  Patient seen and examined with Dr. Algis Liming. We discussed all aspects of the encounter. I agree with the assessment and plan as stated above.   Results of cath reviewed. He has severe 2v CAD and moderate to severe AS. Ideally would need CABG/AVR but I suspect his lung disease is too severe. Best approach may be to proceed with PCI now (if ostial LCX is approachable) and watchful waiting of AS with consideration of TAVR. Will need PFTs. Will d/w Dr. Burt Knack. LVEDP elevated on cath. Will diurese carefully with daily po lasix.  Most pressing issue now is GPC bacteremia. No clear source though he reports passing kidney stone last week. UA is negative for infxn/blood but collected after levofloxacin started. CT with perinephric stranding. He is on vanc/levoflox. Will ask ID to see. May need TEE.    Anthony Joiner,MD 10:20 AM

## 2013-08-22 NOTE — Progress Notes (Signed)
ANTICOAGULATION CONSULT NOTE - Follow Up Consult  Pharmacy Consult for heparin Indication: CAD  Labs:  Recent Labs  08/16/2013 1146 08/22/2013 1247 08/11/2013 1351 08/13/2013 1438 08/19/2013 2130  08/12/2013 0330 08/30/2013 0915 08/22/13 0300 08/22/13 1135  HGB 11.1* 12.6*  --   --   --   --  10.4*  --  10.0*  --   HCT 32.9* 37.0*  --   --   --   --  30.6*  --  30.1*  --   PLT 157  --   --   --   --   --  143*  --  137*  --   APTT  --   --  26  --   --   --   --   --   --   --   LABPROT  --   --  14.0  --   --   --   --   --   --   --   INR  --   --  1.10  --   --   --   --   --   --   --   HEPARINUNFRC  --   --   --   --   --   < >  --  0.12* 0.12* 0.17*  CREATININE 1.70* 1.80*  --   --   --   --  1.51*  --   --   --   TROPONINI  --   --   --  0.74* 0.63*  --  0.37*  --   --   --   < > = values in this interval not displayed.  Assessment: 73yo male subtherapeutic on heparin after resumed post-cath, now awaiting intervention. HL remains SUBtherapeutic after increase. Per nurse, no interruptions in gtt and no noted s/s bleeding. H/H, plt trending down, will continue to monitor.   Goal of Therapy:  Heparin level 0.3-0.7 units/ml   Plan:  - Will increase heparin gtt to 2400 units/hr (3-4 units/kg/hr) - Check HL in 8 hrs - Daily HL, CBC, s/s bleeding  Harolyn Rutherford, PharmD Clinical Pharmacist - Resident Pager: [phone] Pharmacy: [phone] 08/22/2013 1:20 PM

## 2013-08-22 NOTE — Progress Notes (Signed)
1 Day Post-Op Procedure(s) (LRB): LEFT AND RIGHT HEART CATHETERIZATION WITH CORONARY ANGIOGRAM (N/A) Subjective:  Patient examined and coronary angiogram, right heart cath data and echocardiogram reviewed. Patient currently feeling better on IV antibiotics and IV heparin having been treated for non-ST elevation MI and gram-positive bacteremia. He has severe 2 vessel CAD and severe aortic stenosis. From my initial evaluation it appears his pulmonary status would preclude sternotomy for combined aVR CABG because of his severe restrictive lung disease from asbestosis. Chest CT scan and PFTs are still pending.  Objective: Vital signs in last 24 hours: Temp:  [98.2 F (36.8 C)-99.3 F (37.4 C)] 98.5 F (36.9 C) (02/14 1545) Pulse Rate:  [64-94] 74 (02/14 1545) Cardiac Rhythm:  [-] Normal sinus rhythm (02/14 0800) Resp:  [27] 27 (02/13 1800) BP: (91-133)/(48-65) 102/55 mmHg (02/14 1545) SpO2:  [96 %-100 %] 99 % (02/14 1545) Weight:  [217 lb 13 oz (98.8 kg)] 217 lb 13 oz (98.8 kg) (02/14 0458)  Hemodynamic parameters for last 24 hours:   afebrile sinus rhythm  Intake/Output from previous day: 02/13 0701 - 02/14 0700 In: 2111 [P.O.:770; I.V.:1191; IV Piggyback:150] Out: 1150 [Urine:1150] Intake/Output this shift: Total I/O In: 369 [I.V.:219; IV Piggyback:150] Out: -   Exam Scattered wheezes and rhonchi 3 / 6 systolic ejection murmur Abdomen obese but nontender Dental exam appears to be adequate Lab Results:  Recent Labs  08/22/2013 0330 08/22/13 0300  WBC 12.2* 13.8*  HGB 10.4* 10.0*  HCT 30.6* 30.1*  PLT 143* 137*   BMET:  Recent Labs  08/22/2013 1146 08/24/2013 1247 08/27/2013 0330  NA 140 138 137  K 3.8 3.5* 3.3*  CL 98 99 97  CO2 24  --  23  GLUCOSE 223* 219* 178*  BUN 31* 28* 31*  CREATININE 1.70* 1.80* 1.51*  CALCIUM 8.8  --  8.3*    PT/INR:  Recent Labs  09/03/2013 1351  LABPROT 14.0  INR 1.10   ABG    Component Value Date/Time   PHART 7.396 08/15/2013 1237    HCO3 26.3* 08/13/2013 1237   TCO2 28 08/17/2013 1237   O2SAT 98.0 08/15/2013 1237   CBG (last 3)   Recent Labs  08/22/13 0749 08/22/13 1151 08/22/13 1700  GLUCAP 177* 167* 174*    Assessment/Plan: S/P Procedure(s) (LRB): LEFT AND RIGHT HEART CATHETERIZATION WITH CORONARY ANGIOGRAM (N/A) Full consult to follow but it appears patient is not a candidate for sternotomy and combined aVR CABG due  to severe asbestos lung disease   LOS: 2 days    VAN TRIGT III,Morad Tal 08/22/2013

## 2013-08-23 ENCOUNTER — Inpatient Hospital Stay (HOSPITAL_COMMUNITY): Payer: Medicare Other

## 2013-08-23 DIAGNOSIS — B958 Unspecified staphylococcus as the cause of diseases classified elsewhere: Secondary | ICD-10-CM

## 2013-08-23 DIAGNOSIS — I2510 Atherosclerotic heart disease of native coronary artery without angina pectoris: Secondary | ICD-10-CM

## 2013-08-23 DIAGNOSIS — I359 Nonrheumatic aortic valve disorder, unspecified: Secondary | ICD-10-CM

## 2013-08-23 LAB — BASIC METABOLIC PANEL
BUN: 34 mg/dL — AB (ref 6–23)
CALCIUM: 7.7 mg/dL — AB (ref 8.4–10.5)
CO2: 24 mEq/L (ref 19–32)
Chloride: 98 mEq/L (ref 96–112)
Creatinine, Ser: 1.45 mg/dL — ABNORMAL HIGH (ref 0.50–1.35)
GFR, EST AFRICAN AMERICAN: 53 mL/min — AB (ref >90)
GFR, EST NON AFRICAN AMERICAN: 46 mL/min — AB (ref >90)
Glucose, Bld: 162 mg/dL — ABNORMAL HIGH (ref 70–99)
Potassium: 4 mEq/L (ref 3.7–5.3)
Sodium: 136 mEq/L — ABNORMAL LOW (ref 137–147)

## 2013-08-23 LAB — CBC
HCT: 29.1 % — ABNORMAL LOW (ref 39.0–52.0)
HEMOGLOBIN: 9.8 g/dL — AB (ref 13.0–17.0)
MCH: 28.6 pg (ref 26.0–34.0)
MCHC: 33.7 g/dL (ref 30.0–36.0)
MCV: 84.8 fL (ref 78.0–100.0)
Platelets: 141 10*3/uL — ABNORMAL LOW (ref 150–400)
RBC: 3.43 MIL/uL — ABNORMAL LOW (ref 4.22–5.81)
RDW: 14.9 % (ref 11.5–15.5)
WBC: 11.7 10*3/uL — ABNORMAL HIGH (ref 4.0–10.5)

## 2013-08-23 LAB — GLUCOSE, CAPILLARY
Glucose-Capillary: 127 mg/dL — ABNORMAL HIGH (ref 70–99)
Glucose-Capillary: 146 mg/dL — ABNORMAL HIGH (ref 70–99)
Glucose-Capillary: 137 mg/dL — ABNORMAL HIGH (ref 70–99)
Glucose-Capillary: 162 mg/dL — ABNORMAL HIGH (ref 70–99)

## 2013-08-23 LAB — HEPARIN LEVEL (UNFRACTIONATED): HEPARIN UNFRACTIONATED: 0.56 [IU]/mL (ref 0.30–0.70)

## 2013-08-23 MED ORDER — HEPARIN SODIUM (PORCINE) 5000 UNIT/ML IJ SOLN
5000.0000 [IU] | Freq: Three times a day (TID) | INTRAMUSCULAR | Status: DC
  Administered 2013-08-23 – 2013-08-27 (×11): 5000 [IU] via SUBCUTANEOUS
  Filled 2013-08-23 (×14): qty 1

## 2013-08-23 MED ORDER — LEVALBUTEROL HCL 0.63 MG/3ML IN NEBU
0.6300 mg | INHALATION_SOLUTION | Freq: Four times a day (QID) | RESPIRATORY_TRACT | Status: DC
  Administered 2013-08-24 – 2013-09-01 (×31): 0.63 mg via RESPIRATORY_TRACT
  Filled 2013-08-23 (×55): qty 3

## 2013-08-23 MED ORDER — BISACODYL 5 MG PO TBEC
5.0000 mg | DELAYED_RELEASE_TABLET | Freq: Every day | ORAL | Status: DC | PRN
  Administered 2013-08-23: 5 mg via ORAL
  Filled 2013-08-23: qty 1

## 2013-08-23 MED ORDER — ALPRAZOLAM 0.25 MG PO TABS
0.2500 mg | ORAL_TABLET | Freq: Once | ORAL | Status: AC
  Administered 2013-08-23: 0.25 mg via ORAL
  Filled 2013-08-23: qty 1

## 2013-08-23 NOTE — Progress Notes (Signed)
Patient ID: Anthony Turner, male   DOB: 1941-04-13, 73 y.o.   MRN: [id number]         [facility] for Infectious Disease    Date of Admission:  08/31/2013   Total days of antibiotics 5        Day 2 vancomycin        Day 2 cefazolin         Principal Problem:   Bacteremia due to Staphylococcus Active Problems:   Aortic stenosis   NSTEMI (non-ST elevated myocardial infarction)   COPD (chronic obstructive pulmonary disease) with emphysema   Diabetes mellitus type 2, diet-controlled   HTN (hypertension)   HLD (hyperlipidemia)   Elevated PSA   Heme positive stool   Acute renal failure   CAD (coronary artery disease)   Normocytic anemia   Diastolic CHF   Asbestosis   Atherosclerosis   Splenomegaly   Nephrolithiasis   DJD (degenerative joint disease)   . aspirin EC  81 mg Oral Daily  . budesonide-formoterol  2 puff Inhalation BID  .  ceFAZolin (ANCEF) IV  2 g Intravenous 3 times per day  . docusate sodium  100 mg Oral Daily  . furosemide  40 mg Oral Daily  . heparin subcutaneous  5,000 Units Subcutaneous 3 times per day  . insulin aspart  0-9 Units Subcutaneous TID WC  . metoprolol tartrate  12.5 mg Oral BID  . nystatin  5 mL Oral QID  . simvastatin  40 mg Oral q1800  . tiotropium  18 mcg Inhalation Daily  . vancomycin  750 mg Intravenous Q12H    Subjective: "I feel terrible". (His family states that he has been able to even drink more in the past 24 hours. In (  Past Medical History  Diagnosis Date  . COPD (chronic obstructive pulmonary disease)   . Type 2 diabetes mellitus, controlled   . Asthma   . Arthritis     with DIP nodules  . History of diverticulitis of colon   . Seasonal allergic rhinitis   . Aortic stenosis     mod by last echo (04/2013) with EF 55%  . HTN (hypertension)   . HLD (hyperlipidemia)   . 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 Asbestos exposure     History  Substance Use Topics  . Smoking  status: Former Smoker -- 4.00 packs/day for 35 years    Types: Cigarettes    Quit date: 02/21/1982  . Smokeless tobacco: Never Used  . Alcohol Use: Yes     Comment: Occasional    Family History  Problem Relation Age of Onset  . Alcohol abuse Father     Allergies  Allergen Reactions  . Sulfa Antibiotics Nausea Only    Objective: Temp:  [98 F (36.7 C)-98.8 F (37.1 C)] 98 F (36.7 C) (02/15 1218) Pulse Rate:  [66-80] 76 (02/15 1236) Resp:  [18-20] 20 (02/15 1218) BP: (71-130)/(37-82) 91/39 mmHg (02/15 1236) SpO2:  [87 %-99 %] 87 % (02/15 1236) Weight:  [101 kg (222 lb 10.6 oz)] 101 kg (222 lb 10.6 oz) (02/15 0327)  General: He is slightly groggy after receiving Versed for a CT scan this morning Lungs: Faint expiratory wheezes Cor: Regular S1-S2 with a 2/6 systolic murmur at the right sternal border  Lab Results Lab Results  Component Value Date   WBC 11.7* 08/23/2013   HGB 9.8* 08/23/2013   HCT 29.1* 08/23/2013   MCV  84.8 08/23/2013   PLT 141* 08/23/2013    Lab Results  Component Value Date   CREATININE 1.45* 08/23/2013   BUN 34* 08/23/2013   NA 136* 08/23/2013   K 4.0 08/23/2013   CL 98 08/23/2013   CO2 24 08/23/2013    Lab Results  Component Value Date   ALT 51 06/23/2013   AST 45* 06/23/2013   ALKPHOS 81 06/23/2013   BILITOT 0.5 06/23/2013      Microbiology: Recent Results (from the past 240 hour(s))  MRSA PCR SCREENING     Status: None   Collection Time    08/27/2013  4:12 PM      Result Value Ref Range Status   MRSA by PCR NEGATIVE  NEGATIVE Final   Comment:            The GeneXpert MRSA Assay (FDA     approved for NASAL specimens     only), is one component of a     comprehensive MRSA colonization     surveillance program. It is not     intended to diagnose MRSA     infection nor to guide or     monitor treatment for     MRSA infections.  CULTURE, BLOOD (ROUTINE X 2)     Status: None   Collection Time    08/30/2013  3:30 AM      Result Value Ref  Range Status   Specimen Description BLOOD LEFT HAND   Final   Special Requests BOTTLES DRAWN AEROBIC AND ANAEROBIC 10CC EACH   Final   Culture  Setup Time     Final   Value: 08/14/2013 08:25     Performed at [facility]   Culture     Final   Value: STAPHYLOCOCCUS SPECIES (COAGULASE NEGATIVE)     Note: RIFAMPIN AND GENTAMICIN SHOULD NOT BE USED AS SINGLE DRUGS FOR TREATMENT OF STAPH INFECTIONS.     Note: Gram Stain Report Called to,Read Back By and Verified With: CHRIS BUNGQUE ON 08/22/2013 AT 12:49A BY WILEJ     Performed at [facility]   Report Status PENDING   Incomplete  CULTURE, BLOOD (ROUTINE X 2)     Status: None   Collection Time    08/27/2013  3:36 AM      Result Value Ref Range Status   Specimen Description BLOOD LEFT HAND   Final   Special Requests BOTTLES DRAWN AEROBIC AND ANAEROBIC 10CC EACH   Final   Culture  Setup Time     Final   Value: 09/03/2013 08:25     Performed at [facility]   Culture     Final   Value: STAPHYLOCOCCUS SPECIES (COAGULASE NEGATIVE)     Note: Gram Stain Report Called to,Read Back By and Verified With: CHRIS BUNGQUE ON 08/22/2013 AT 12:49A BY WILEJ     Performed at [facility]   Report Status PENDING   Incomplete  CULTURE, BLOOD (ROUTINE X 2)     Status: None   Collection Time    08/22/13  2:00 AM      Result Value Ref Range Status   Specimen Description BLOOD RIGHT ARM   Final   Special Requests BOTTLES DRAWN AEROBIC AND ANAEROBIC 10CC   Final   Culture  Setup Time     Final   Value: 08/22/2013 12:10     Performed at [facility]   Culture     Final   Value: GRAM POSITIVE  COCCI IN CLUSTERS     Note: Gram Stain Report Called to,Read Back By and Verified With: [place] Hodgin RN on 08/23/13 at 02:20 by Rise Mu     Performed at [facility]   Report Status PENDING   Incomplete  CULTURE, BLOOD (ROUTINE X 2)     Status: None   Collection Time    08/22/13  2:15 AM      Result Value Ref Range  Status   Specimen Description BLOOD LEFT HAND   Final   Special Requests     Final   Value: BOTTLES DRAWN AEROBIC AND ANAEROBIC 10CC AER,5CC ANA   Culture  Setup Time     Final   Value: 08/22/2013 12:10     Performed at [facility]   Culture     Final   Value: GRAM POSITIVE COCCI IN CLUSTERS     Note: Gram Stain Report Called to,Read Back By and Verified With: Dellie Burns RN on 08/23/13 at 01:25 by Rise Mu     Performed at [facility]   Report Status PENDING   Incomplete    Studies/Results: Ct Abdomen Pelvis Wo Contrast  08/24/2013   CLINICAL DATA:  Left-sided flank pain and fever.  EXAM: CT ABDOMEN AND PELVIS WITHOUT CONTRAST  TECHNIQUE: Multidetector CT imaging of the abdomen and pelvis was performed following the standard protocol without intravenous contrast.  COMPARISON:  No priors.  FINDINGS: Lung Bases: Extensive calcified pleural plaques throughout the lung bases bilaterally, likely related to asbestos related pleural disease. Small left pleural effusion layering dependently. Assessment of the lung parenchyma in the lung bases is limited by considerable respiratory motion, however, there is likely some scarring and perhaps developing rounded atelectasis in the right lung base, particularly in the right lower lobe. Extensive aortic valve calcifications. Atherosclerotic calcifications are noted in the left anterior descending, left circumflex and right coronary arteries. Small hiatal hernia.  Abdomen/Pelvis: Iodinated contrast material is noted in the collecting systems of the kidneys bilaterally, the ureters bilaterally and within the lumen of the urinary bladder, related to recent cath lab procedure. Mild bilateral perinephric stranding. Low attenuation throughout the hepatic parenchyma, compatible with hepatic steatosis. The unenhanced appearance of the gallbladder, pancreas and bilateral adrenal glands is unremarkable. Spleen is enlarged measuring 10.4 x 16.5 x 15.4 cm.  Extensive atherosclerosis throughout the abdominal and pelvic vasculature, without evidence of aneurysm. Numerous colonic diverticulae are noted, without surrounding inflammatory changes to suggest an acute diverticulitis at this time. No significant volume of ascites. No pneumoperitoneum. No pathologic distention of small bowel. No definite lymphadenopathy identified within the abdomen or pelvis on today's non contrast CT examination.  Musculoskeletal: There are no aggressive appearing lytic or blastic lesions noted in the visualized portions of the skeleton.  IMPRESSION: 1. No acute findings in the abdomen or pelvis to account for the patient's symptoms. 2. Colonic diverticulosis without findings to suggest acute diverticulitis at this time. 3. Splenomegaly. 4. Asbestos related pleural disease. 5. Trace left pleural effusion. 6. Extensive atherosclerosis, including at least 3 vessel coronary artery disease. 7. There are calcifications of the aortic valve. Echocardiographic correlation for evaluation of potential valvular dysfunction may be warranted if clinically indicated. 8. Small hiatal hernia. 9. Hepatic steatosis. 10. Additional incidental findings, as above.   Electronically Signed   By: Vinnie Langton M.D.   On: 09/05/2013 16:25   Dg Orthopantogram  08/22/2013   CLINICAL DATA:  Bacteremia  EXAM: ORTHOPANTOGRAM/PANORAMIC  COMPARISON:  None.  FINDINGS:  Mandibular rami are unremarkable. The condyles are incompletely included in the field of view. No gross evidence for fracture. Dental amalgam noted at multiple locations.  IMPRESSION: No gross evidence for mandibular fracture.   Electronically Signed   By: Conchita Paris M.D.   On: 08/22/2013 20:40   Ct Chest Wo Contrast  08/23/2013   CLINICAL DATA:  History of asbestos exposure. Evaluate for potential asbestosis.  EXAM: CT CHEST WITHOUT CONTRAST  TECHNIQUE: Multidetector CT imaging of the chest was performed following the standard protocol without IV  contrast.  COMPARISON:  CT of the abdomen and pelvis 08/13/2013.  FINDINGS: Mediastinum: Heart size is mildly enlarged. There is no significant pericardial fluid, thickening or pericardial calcification. Multiple borderline enlarged mediastinal lymph nodes. No definite pathologic mediastinal or hilar nodal enlargement. Please note that accurate exclusion of hilar adenopathy is limited on noncontrast CT scans. Small hiatal hernia with some circumferential thickening of the distal third of the esophagus. There is atherosclerosis of the thoracic aorta, the great vessels of the mediastinum and the coronary arteries, including calcified atherosclerotic plaque in the left main, left anterior descending, left circumflex and right coronary arteries. Severe calcifications of the aortic valve.  Lungs/Pleura: Extensive calcified pleural plaque is again noted throughout the thorax bilaterally, compatible with asbestos related pleural disease. Associated with this there are pleural-based areas of architectural distortion, some of which are slightly masslike, most notable in the right lower lobe and the left upper lobe, favored to reflect areas of developing rounded atelectasis. Several areas of ground-glass attenuation are seen scattered throughout the lungs bilaterally, which are nonspecific, largest of which is in the posterior aspect of the left upper lobe (image 16 of series 3 and image 76 of series [id number]) measuring approximately 2.3 x 1.4 x 3.1 cm. No definite areas of subpleural reticulation, traction bronchiectasis or frank honeycombing are identified at this time.  Upper Abdomen: Referred to contemporaneous CT of the abdomen and pelvis 08/12/2013 for full description of abdominal findings.  Musculoskeletal: There are no aggressive appearing lytic or blastic lesions noted in the visualized portions of the skeleton.  IMPRESSION: 1. Severe asbestos related pleural disease with multifocal areas of probable developing rounded  atelectasis, as detailed above. 2. No definite imaging findings at this time to strongly suggest asbestosis in the lungs, however, given the extent of multifocal ground-glass attenuation, early changes of asbestosis is not excluded, and followup high-resolution chest CT is recommended to assess for temporal changes in the appearance of the lung parenchyma. 3. Amongst the areas of ground-glass attenuation there is one that is slightly mass like in appearance. This is nonspecific, but measures approximately 3.1 x 1.4 x 2.3 cm in the posterior aspect of the left upper lobe. The possibility of an adenocarcinoma is not excluded, and repeat evaluation with high-resolution chest CT in 3 months is recommended at this time to confirm persistence and assess stability. This recommendation follows the consensus statement: Recommendations for the Management of Subsolid Pulmonary Nodules Detected at CT: A Statement from the Fleischner Society as published in Radiology 2013; 266:304-317. 4. Atherosclerosis, including left main and 3 vessel coronary artery disease. Assessment for potential risk factor modification, dietary therapy or pharmacologic therapy may be warranted, if clinically indicated. 5. There are calcifications of the aortic valve. Echocardiographic correlation for evaluation of potential valvular dysfunction may be warranted if clinically indicated. 6. Mild cardiomegaly.   Electronically Signed   By: Vinnie Langton M.D.   On: 08/23/2013 12:14    Assessment: Four of  4 admission blood cultures are growing coagulase-negative staph. He is at high risk for aortic valve endocarditis.  Plan: 1. Continue current antibiotics pending antibiotic susceptibilities 2. Repeat blood cultures tomorrow 3. Hold off on placing PICC until blood cultures are negative  Michel Bickers, MD [facility] for Infectious [place] [phone] pager   [phone] cell 08/23/2013, 2:00 PM

## 2013-08-23 NOTE — Progress Notes (Signed)
Pt c/o feeling need to urinate and unable to. Bladder scan x 3 shows between 60-120 mls in bladder, Pt does have hx of prostate problem? Per wife. MD aware.

## 2013-08-23 NOTE — Consult Note (Signed)
[address]       [place] [address]             [phone]        Bryon Goren [place] Medical Record #[id number] Date of Birth: 1940/09/05  Referring: No ref. provider found Primary Care: Ria Bush, MD  Chief Complaint:    Chief Complaint  Patient presents with  . Shortness of Breath   patient examined, coronary angiogram, 2-D echocardiogram and radiographic studies reviewed  History of Present Illness:     I was asked to evaluate this 73 year old chronically ill Caucasian male with severe asbestosis-not on home oxygen-who was admitted with worsening shortness of breath and left lower quadrant pain and was found to have positive blood cultures gram-positive cocci in clusters. On admission he also had positive cardiac enzymes with nonspecific EKG changes. Echocardiogram showed  severe Aortic stenosis, mild to moderate MR and EF 45-50%. After he was stabilized he underwent right and left heart cath which demonstrated moderate pulmonary hypertension, LVEDP 27, Peak. trans-aortic gradient of 43 mmHg, and dominant 2 vessel CAD of the LAD and circumflex. Abdominal CT scan was unrevealing. Chest CT scan is pending. The patient has a long history of cardiac murmur since childhood-probable bicuspid aortic valve disease now with severe AS. Patient was exposed heavily to asbestos years ago and is followed by the [facility] PFTs yet available. Patient also had a heavy smoking history 4 packs per day but stopped more than 25 years ago.  He has no active dental complaints and has dental cleaning twice year. [facility] x-ray show no evidence of dental infection  The patient symptomatically is better with IV vancomycin and IV heparin. Plan of care for his heart disease is being formulated and is complicated by severe chronic lung disease-restrictive from asbestosis. Chest x-ray shows severe bilateral fibrotic pattern and a CT chest  without contrast is pending-creatinine 1.7. No clear source of the bacteremia has been determined.  Current Activity/ Functional Status: Significant apparent from his chronic lung disease  Significant dyspnea on exertion Zubrod Score: At the time of surgery this patient's most appropriate activity status/level should be described as: []     0    Normal activity, no symptoms []     1    Restricted in physical strenuous activity but ambulatory, able to do out light work [x]     2    Ambulatory and capable of self care, unable to do work activities, up and about                 more than 50%  Of the time                            []     3    Only limited self care, in bed greater than 50% of waking hours []     4    Completely disabled, no self care, confined to bed or chair []     5    Moribund  Past Medical History  Diagnosis Date  . COPD (chronic obstructive pulmonary disease)   . Type 2 diabetes mellitus, controlled   . Asthma   . Arthritis     with DIP nodules  . History of diverticulitis of colon   . Seasonal allergic rhinitis   . Aortic stenosis     mod by last echo (04/2013) with EF 55%  . HTN (  hypertension)   . HLD (hyperlipidemia)   . 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 Asbestos exposure     Past Surgical History  Procedure Laterality Date  . Appendectomy  ? 1956  . Tonsillectomy  ? 1944  . Cataract extraction Bilateral 2012    History  Smoking status  . Former Smoker -- 4.00 packs/day for 35 years  . Types: Cigarettes  . Quit date: 02/21/1982  Smokeless tobacco  . Never Used    History  Alcohol Use  . Yes    Comment: Occasional    History   Social History  . Marital Status: Married    Spouse Name: N/A    Number of Children: N/A  . Years of Education: N/A   Occupational History  . Not on file.   Social History Main Topics  . Smoking status: Former Smoker -- 4.00 packs/day for 35 years    Types: Cigarettes     Quit date: 02/21/1982  . Smokeless tobacco: Never Used  . Alcohol Use: Yes     Comment: Occasional  . Drug Use: No  . Sexual Activity: Not on file   Other Topics Concern  . Not on file   Social History Narrative   Lives with wife, 3 dogs   Occupation: retired, was Clinical research associate   Edu: HS   Activity: no regular exercise   Diet: good water, fruits/vegetables daily    Allergies  Allergen Reactions  . Sulfa Antibiotics Nausea Only    Current Facility-Administered Medications  Medication Dose Route Frequency Provider Last Rate Last Dose  . aspirin EC tablet 81 mg  81 mg Oral Daily Tarri Fuller, PA-C   81 mg at 08/23/13 0919  . budesonide-formoterol (SYMBICORT) 160-4.5 MCG/ACT inhaler 2 puff  2 puff Inhalation BID Tarri Fuller, PA-C   2 puff at 08/23/13 0756  . ceFAZolin (ANCEF) IVPB 2 g/50 mL premix  2 g Intravenous 3 times per day Michel Bickers, MD   2 g at 08/23/13 5170  . docusate sodium (COLACE) capsule 100 mg  100 mg Oral Daily Tarri Fuller, PA-C   100 mg at 08/23/13 0174  . furosemide (LASIX) tablet 40 mg  40 mg Oral Daily Clinton Gallant, MD   40 mg at 08/23/13 9449  . heparin ADULT infusion 100 units/mL (25000 units/250 mL)  2,400 Units/hr Intravenous Continuous Ruta Hinds von Vajna, RPH 24 mL/hr at 08/23/13 0600 2,400 Units/hr at 08/23/13 0600  . HYDROcodone-acetaminophen (NORCO/VICODIN) 5-325 MG per tablet 1-2 tablet  1-2 tablet Oral Q6H PRN Sherren Mocha, MD   1 tablet at 08/23/13 0259  . insulin aspart (novoLOG) injection 0-9 Units  0-9 Units Subcutaneous TID WC Tarri Fuller, PA-C   1 Units at 08/23/13 [phone]  . levalbuterol (XOPENEX) nebulizer solution 0.63 mg  0.63 mg Nebulization Q6H PRN Minus Breeding, MD   0.63 mg at 08/22/13 2233  . LORazepam (ATIVAN) tablet 0.5 mg  0.5 mg Oral Q6H PRN Cecilie Kicks, NP   0.5 mg at 08/23/13 1638  . magnesium hydroxide (MILK OF MAGNESIA) suspension 30 mL  30 mL Oral QHS PRN Grafton Folk, MD      . metoprolol tartrate (LOPRESSOR) tablet 12.5 mg   12.5 mg Oral BID Lorretta Harp, MD   12.5 mg at 08/23/13 4665  . midazolam (VERSED) injection 2 mg  2 mg Intravenous Once Ivin Poot, MD      . nitroGLYCERIN (NITROSTAT) SL tablet  0.4 mg  0.4 mg Sublingual Q5 Min x 3 PRN Tarri Fuller, PA-C   0.4 mg at 08/29/2013 0305  . nystatin (MYCOSTATIN) 100000 UNIT/ML suspension 500,000 Units  5 mL Oral QID Tarri Fuller, PA-C   500,000 Units at 08/23/13 [phone]  . ondansetron (ZOFRAN) injection 4 mg  4 mg Intravenous Q6H PRN Minus Breeding, MD      . simvastatin (ZOCOR) tablet 40 mg  40 mg Oral q1800 Tarri Fuller, PA-C   40 mg at 08/22/13 1811  . tiotropium (SPIRIVA) inhalation capsule 18 mcg  18 mcg Inhalation Daily Tarri Fuller, PA-C   18 mcg at 08/23/13 0756  . vancomycin (VANCOCIN) IVPB 750 mg/150 ml premix  750 mg Intravenous Q12H Rogue Bussing, RPH   750 mg at 08/23/13 0122  . zolpidem (AMBIEN) tablet 5 mg  5 mg Oral QHS PRN Cecilie Kicks, NP   5 mg at 08/23/2013 2222    Prescriptions prior to admission  Medication Sig Dispense Refill  . acetaminophen (TYLENOL) 500 MG tablet Take 1,000 mg by mouth every 6 (six) hours as needed for mild pain.      Marland Kitchen albuterol (PROVENTIL HFA;VENTOLIN HFA) 108 (90 BASE) MCG/ACT inhaler Inhale 2 puffs into the lungs 3 (three) times daily.      Marland Kitchen amLODipine (NORVASC) 5 MG tablet Take 2.5 mg by mouth daily.       Marland Kitchen aspirin 81 MG tablet Take 81 mg by mouth daily.      . budesonide-formoterol (SYMBICORT) 160-4.5 MCG/ACT inhaler Inhale 2 puffs into the lungs 2 (two) times daily.  1 Inhaler  3  . Docusate Calcium (STOOL SOFTENER PO) Take 1 capsule by mouth 2 (two) times daily.       . furosemide (LASIX) 20 MG tablet Take 20-60 mg by mouth daily as needed for fluid.      . hydrochlorothiazide (HYDRODIURIL) 25 MG tablet Take 25 mg by mouth daily.      Marland Kitchen ibuprofen (ADVIL,MOTRIN) 200 MG tablet Take 400 mg by mouth every 6 (six) hours as needed.      Marland Kitchen levofloxacin (LEVAQUIN) 500 MG tablet Take 1 tablet (500 mg total) by mouth  daily.  7 tablet  0  . losartan (COZAAR) 100 MG tablet Take 100 mg by mouth daily.      Marland Kitchen NAPROXEN PO Take 2 tablets by mouth every 6 (six) hours as needed (fever/ pain).      . potassium chloride SA (K-DUR,KLOR-CON) 20 MEQ tablet Take 20 mEq by mouth daily.      . pravastatin (PRAVACHOL) 80 MG tablet Take 80 mg by mouth daily.      Marland Kitchen tiotropium (SPIRIVA) 18 MCG inhalation capsule Place 18 mcg into inhaler and inhale daily.      . vitamin B-12 (CYANOCOBALAMIN) 1000 MCG tablet Take 1,000 mcg by mouth daily.      Marland Kitchen nystatin (MYCOSTATIN) 100000 UNIT/ML suspension Take 5 mLs (500,000 Units total) by mouth 4 (four) times daily.  60 mL  0    Family History  Problem Relation Age of Onset  . Alcohol abuse Father      Review of Systems:   No history of stroke Right-hand dominant No significant weight change in the past 3 months Diet controlled diabetes mellitus Heme positive stool with anemia and splenomegaly on CT scan of abdomen Patient passed a kidney stone a few weeks ago but no current urinary symptoms No history of lung biopsy or thoracic trauma or pneumothorax  Cardiac Review of Systems: Y or N  Chest Pain [  N.  ]  Resting SOB [  Y. ] Exertional SOB  [ Y. ]  Orthopnea [Y.  ]   Pedal Edema [  N. ]    Palpitations [ N. ] Syncope  [  ]   Presyncope [   ]  General Review of Systems: [Y] = yes [  ]=no Constitional: recent weight change [  ]; anorexia [  ]; fatigue [  ]; nausea [  ]; night sweats [  ]; fever [Y.  ]; or chills [  ]                                                               Dental: poor dentition[  ]; Last Dentist visit: 6 months  Eye : blurred vision [  ]; diplopia [   ]; vision changes [  ];  Amaurosis fugax[  ]; Resp: cough [  ];  wheezing[  ];  hemoptysis[  ]; shortness of breath[  ]; paroxysmal nocturnal dyspnea[  ]; dyspnea on exertion[ Y. ]; or orthopnea[  ];  GI:  gallstones[  ], vomiting[  ];  dysphagia[  ]; melena[  ];  hematochezia [  ]; heartburn[  ];   Hx  of  Colonoscopy[  ]; GU: kidney stones [  ]; hematuria[  ];   dysuria [  ];  nocturia[  ];  history of     obstruction [  ]; urinary frequency [  ]             Skin: rash, swelling[  ];, hair loss[  ];  peripheral edema[  ];  or itching[  ]; Musculosketetal: myalgias[  ];  joint swelling[  ];  joint erythema[  ];  joint pain[  ];  back pain[  ];  Heme/Lymph: bruising[  ];  bleeding[  ];  anemia[  ];  Neuro: TIA[  ];  headaches[  ];  stroke[  ];  vertigo[  ];  seizures[  ];   paresthesias[  ];  difficulty walking[  ];  Psych:depression[  ]; anxiety[  ];  Endocrine: diabetes[N.  ];  thyroid dysfunction[  ];  Immunizations: Flu [  ]; Pneumococcal[  ];  Other:  Physical Exam: BP 109/54  Pulse 73  Temp(Src) 98.1 F (36.7 C) (Oral)  Resp 18  Ht 5\' 6"  (1.676 m)  Wt 222 lb 10.6 oz (101 kg)  BMI 35.96 kg/m2  SpO2 99%    Exam Gen. appearance -elderly obese Caucasian male chronically ill-appearing   HEENT-normocephalic pupils equal good dentition Neck-mild JVD no bruit or adenopathy Thorax-scattered rales and wheezes COR-grade 3/6 systolic ejection murmur, sinus rhythm -Abdomen-soft obese probable mild ascites, no palpable mass or pulsatile mass Extremities-mild edema no tenderness or cyanosis Neurologic-appears depressed but responsive, moves all extremities, currently in bed- Vascular-palpable pulses in extremities   Diagnostic Studies & Laboratory data:     Recent Radiology Findings:   Ct Abdomen Pelvis Wo Contrast  08/16/2013   CLINICAL DATA:  Left-sided flank pain and fever.  EXAM: CT ABDOMEN AND PELVIS WITHOUT CONTRAST  TECHNIQUE: Multidetector CT imaging of the abdomen and pelvis was performed following the standard protocol without intravenous contrast.  COMPARISON:  No priors.  FINDINGS: Lung  Bases: Extensive calcified pleural plaques throughout the lung bases bilaterally, likely related to asbestos related pleural disease. Small left pleural effusion layering dependently.  Assessment of the lung parenchyma in the lung bases is limited by considerable respiratory motion, however, there is likely some scarring and perhaps developing rounded atelectasis in the right lung base, particularly in the right lower lobe. Extensive aortic valve calcifications. Atherosclerotic calcifications are noted in the left anterior descending, left circumflex and right coronary arteries. Small hiatal hernia.  Abdomen/Pelvis: Iodinated contrast material is noted in the collecting systems of the kidneys bilaterally, the ureters bilaterally and within the lumen of the urinary bladder, related to recent cath lab procedure. Mild bilateral perinephric stranding. Low attenuation throughout the hepatic parenchyma, compatible with hepatic steatosis. The unenhanced appearance of the gallbladder, pancreas and bilateral adrenal glands is unremarkable. Spleen is enlarged measuring 10.4 x 16.5 x 15.4 cm. Extensive atherosclerosis throughout the abdominal and pelvic vasculature, without evidence of aneurysm. Numerous colonic diverticulae are noted, without surrounding inflammatory changes to suggest an acute diverticulitis at this time. No significant volume of ascites. No pneumoperitoneum. No pathologic distention of small bowel. No definite lymphadenopathy identified within the abdomen or pelvis on today's non contrast CT examination.  Musculoskeletal: There are no aggressive appearing lytic or blastic lesions noted in the visualized portions of the skeleton.  IMPRESSION: 1. No acute findings in the abdomen or pelvis to account for the patient's symptoms. 2. Colonic diverticulosis without findings to suggest acute diverticulitis at this time. 3. Splenomegaly. 4. Asbestos related pleural disease. 5. Trace left pleural effusion. 6. Extensive atherosclerosis, including at least 3 vessel coronary artery disease. 7. There are calcifications of the aortic valve. Echocardiographic correlation for evaluation of potential  valvular dysfunction may be warranted if clinically indicated. 8. Small hiatal hernia. 9. Hepatic steatosis. 10. Additional incidental findings, as above.   Electronically Signed   By: Vinnie Langton M.D.   On: 09/04/2013 16:25   Dg Orthopantogram  08/22/2013   CLINICAL DATA:  Bacteremia  EXAM: ORTHOPANTOGRAM/PANORAMIC  COMPARISON:  None.  FINDINGS: Mandibular rami are unremarkable. The condyles are incompletely included in the field of view. No gross evidence for fracture. Dental amalgam noted at multiple locations.  IMPRESSION: No gross evidence for mandibular fracture.   Electronically Signed   By: Conchita Paris M.D.   On: 08/22/2013 20:40      Recent Lab Findings: Lab Results  Component Value Date   WBC 11.7* 08/23/2013   HGB 9.8* 08/23/2013   HCT 29.1* 08/23/2013   PLT 141* 08/23/2013   GLUCOSE 178* 08/31/2013   CHOL 115 08/19/2013   TRIG 173* 08/14/2013   HDL 17* 08/30/2013   LDLCALC 63 08/25/2013   ALT 51 06/23/2013   AST 45* 06/23/2013   NA 137 08/27/2013   K 3.3* 08/16/2013   CL 97 08/12/2013   CREATININE 1.51* 08/27/2013   BUN 31* 08/27/2013   CO2 23 08/10/2013   TSH 1.592 08/27/2013   INR 1.10 08/27/2013   HGBA1C 7.1* 08/24/2013      Assessment / Plan:      The patient has significant CAD and significant calcific aortic stenosis. He would be a very poor candidate for combined aVR-CABG with a combination of severe restrictive lung disease (asbestosis) and abdominal obesity. He would be at high risk for prolonged pulmonary insufficiency, tracheostomy, and poor outcome with predicted mortality greater than 20%. Recommend treatment of his coronary disease with PCI followed by consideration of TAVR. Situation discussed with patient and his  family.      @ME1 @ 08/23/2013 9:52 AM

## 2013-08-23 NOTE — Progress Notes (Signed)
Subjective: Anthony Turner is a 73 yo man pmh AS, CAD, asbestosis, COPD, p/w NSTEMI on 08/27/2013. Trop peak (0.74).   LHC on 08/16/2013: mod-severe AS (1.16cm squared valve area), hig-grade ostial circumflex, mod-severe mid LAD, pulm HTN with elevated end diastolic pressures.   ECHO 08/22/13: EF 55-60%. Moderate AS (mean 29). Normal RV  Breathing ok. Flank pain improved. F/u BCX also positive. ID has seen and will treat with 4 weeks of abx. Also seen by Dr. Prescott Gum who felt he is poor CABG/AVR candidate.  Lasix restarted yesterday. Weight up 5 pounds (?)    Objective: Vital signs in last 24 hours: Filed Vitals:   08/22/13 2355 08/23/13 0211 08/23/13 0327 08/23/13 0757  BP:  130/82 123/60   Pulse:  80 74   Temp: 98.8 F (37.1 C)  98.6 F (37 C)   TempSrc: Oral  Oral   Resp:  18 20   Height:      Weight:   222 lb 10.6 oz (101 kg)   SpO2:  97% 94% 99%   Weight change: 4 lb 13.6 oz (2.2 kg)  Intake/Output Summary (Last 24 hours) at 08/23/13 1245 Last data filed at 08/23/13 0600  Gross per 24 hour  Intake    780 ml  Output    600 ml  Net    180 ml   General: sitting in chair NAD HEENT: PERRL, EOMI, no scleral icterus Cardiac: RRR, no YKDX,8/3 systolic murmur at RSB, no gallops Pulm: markedly decreased BS throughout. Bibasilar crackles Abd: obese soft, nontender, nondistended, BS present Ext: warm and well perfused, tr pedal edema Neuro: alert and oriented X3, cranial nerves II-XII grossly intact  Lab Results: Basic Metabolic Panel:  Recent Labs Lab 08/30/2013 1146 08/22/2013 1247 08/09/2013 1900 08/12/2013 0330 08/15/2013 0915  NA 140 138  --  137  --   K 3.8 3.5*  --  3.3*  --   CL 98 99  --  97  --   CO2 24  --   --  23  --   GLUCOSE 223* 219*  --  178*  --   BUN 31* 28*  --  31*  --   CREATININE 1.70* 1.80*  --  1.51*  --   CALCIUM 8.8  --   --  8.3*  --   MG  --   --  2.2  --  2.2   CBC:  Recent Labs Lab 08/22/13 0300 08/23/13 0300  WBC 13.8* 11.7*  HGB 10.0*  9.8*  HCT 30.1* 29.1*  MCV 86.2 84.8  PLT 137* 141*   Cardiac Enzymes:  Recent Labs Lab 09/05/2013 1438 09/04/2013 2130 08/12/2013 0330  TROPONINI 0.74* 0.63* 0.37*   BNP:  Recent Labs Lab 08/15/2013 1146  PROBNP 679.3*   CBG:  Recent Labs Lab 08/27/2013 1650 08/30/2013 2127 08/22/13 0749 08/22/13 1151 08/22/13 1700 08/22/13 2111  GLUCAP 190* 179* 177* 167* 174* 160*   Hemoglobin A1C:  Recent Labs Lab 08/22/2013 1900  HGBA1C 7.1*   Fasting Lipid Panel:  Recent Labs Lab 08/23/2013 0330  CHOL 115  HDL 17*  LDLCALC 63  TRIG 173*  CHOLHDL 6.8   Thyroid Function Tests:  Recent Labs Lab 08/23/2013 1900  TSH 1.592   Coagulation:  Recent Labs Lab 08/14/2013 1351  LABPROT 14.0  INR 1.10   Cardiac Studies: Echo 10/14: report not available [facility]  Echo 2/15: EF 38-25%, grade 2 diastolic dysfunction, mod AS  Medications: I have reviewed the patient's current medications. Scheduled  Meds: . aspirin EC  81 mg Oral Daily  . budesonide-formoterol  2 puff Inhalation BID  .  ceFAZolin (ANCEF) IV  2 g Intravenous 3 times per day  . docusate sodium  100 mg Oral Daily  . furosemide  40 mg Oral Daily  . insulin aspart  0-9 Units Subcutaneous TID WC  . metoprolol tartrate  12.5 mg Oral BID  . midazolam  2 mg Intravenous Once  . nystatin  5 mL Oral QID  . simvastatin  40 mg Oral q1800  . tiotropium  18 mcg Inhalation Daily  . vancomycin  750 mg Intravenous Q12H   Continuous Infusions: . heparin 2,400 Units/hr (08/23/13 0600)   PRN Meds:.HYDROcodone-acetaminophen, levalbuterol, LORazepam, magnesium hydroxide, nitroGLYCERIN, ondansetron (ZOFRAN) IV, zolpidem Assessment/Plan: # G+ cocci bactermia - Bcx on 08/26/2013 speciation still pending was previously on levaquin d/c on 08/22/13 for presumed copd exacerbation.BCx 08/22/13 G+cocci.  No infiltrate on CXR. Urinalysis negative (but obtained after levofloxacin started). Flu negative at PCP office. CT abd/pelvis no intra-abd abscess  or pathology. Addition of vanc on 08/22/13.  - ID consulted added cefazolin on 08/22/13.  -Will need PICC placement after repeat BCx negative -cont vanc/cefazolin   # NSTEMI w/Severe 2v CAD on cath- ASA, heparin, lopressor.  LHC on 08/15/2013 showed pulm htn, and multivessel disease. Given extensive lung dz maynot be candidate for CABG. PCI with TAVR maybe an option after bacteremia clears.  -ct chest and PFTs for evaluation of absestosis -CVTS evaluating and full consult still pending lung studies  # Acute diastolic heart failure. LVEDP 27 on cath. Low-dose metoprolol started 08/11/2013. Continue daily lasix 40mg . Diuresing well.   #COPD/asbestosis - chronic issue. Ct chest and PFTs pending.    # Aortic stenosis. Moderate- severe by LHC. .   # Hyperlipidemia - on statin drug. LDL 63  # Acute renal insufficiency.  1.8 (baseline 1.0) >>> 1.5. CT abd no obstruction or acute intra-abdominal pathology.   # DM2: SSI, HgbA1c 7.1   LOS: 3 days   Anthony Gallant, MD 08/23/2013, 8:12 AM  Patient seen and examined with Dr. Algis Liming, PA-C. We discussed all aspects of the encounter. I agree with the assessment and plan as stated above.   Overall stable. Appreciate ID recs. Will continue 4 weeks IV abx. Will need PICC line once cultures clear. They did not feel strongly about TEE as it will not change course at this time.   Seen by Dr. Ron Agee. Not CABG/AVR candidate. I discussed with Dr. Burt Knack who feels LCX and LAD both approachable percutaneously and we can arrange PCI when more stable from infectious standpoint.   AS is moderate. Continue to follow.   Continue diuresis. Chest CT and PFTs pending.   Chelsei Mcchesney,MD 10:30 AM

## 2013-08-23 NOTE — Progress Notes (Signed)
Pt c/o abd pain, need to void and unable to. Bladder scan 750ml. Peri care with soap/water I/O cath per policy. 800 mls uop

## 2013-08-24 LAB — CULTURE, BLOOD (ROUTINE X 2)

## 2013-08-24 LAB — VANCOMYCIN, TROUGH: VANCOMYCIN TR: 15.6 ug/mL (ref 10.0–20.0)

## 2013-08-24 LAB — GLUCOSE, CAPILLARY
GLUCOSE-CAPILLARY: 157 mg/dL — AB (ref 70–99)
GLUCOSE-CAPILLARY: 147 mg/dL — AB (ref 70–99)
Glucose-Capillary: 138 mg/dL — ABNORMAL HIGH (ref 70–99)
Glucose-Capillary: 143 mg/dL — ABNORMAL HIGH (ref 70–99)

## 2013-08-24 MED ORDER — METOPROLOL TARTRATE 25 MG PO TABS
25.0000 mg | ORAL_TABLET | Freq: Two times a day (BID) | ORAL | Status: DC
  Administered 2013-08-24 – 2013-08-25 (×3): 25 mg via ORAL
  Filled 2013-08-24 (×6): qty 1

## 2013-08-24 MED ORDER — ISOSORBIDE MONONITRATE ER 30 MG PO TB24
30.0000 mg | ORAL_TABLET | Freq: Every day | ORAL | Status: DC
  Administered 2013-08-24: 30 mg via ORAL
  Filled 2013-08-24: qty 1

## 2013-08-24 MED ORDER — CLOPIDOGREL BISULFATE 75 MG PO TABS
75.0000 mg | ORAL_TABLET | Freq: Every day | ORAL | Status: DC
  Administered 2013-08-24 – 2013-08-28 (×5): 75 mg via ORAL
  Filled 2013-08-24 (×8): qty 1

## 2013-08-24 MED ORDER — NITROGLYCERIN IN D5W 200-5 MCG/ML-% IV SOLN
2.0000 ug/min | INTRAVENOUS | Status: DC
  Administered 2013-08-24: 10 ug/min via INTRAVENOUS
  Filled 2013-08-24: qty 250

## 2013-08-24 MED ORDER — SODIUM CHLORIDE 0.9 % IV SOLN
INTRAVENOUS | Status: DC | PRN
  Administered 2013-08-29: 500 mL via INTRAVENOUS

## 2013-08-24 NOTE — Progress Notes (Addendum)
TELEMETRY: Reviewed telemetry pt in NSR with one PVC triplet.: Filed Vitals:   08/24/13 0400 08/24/13 0500 08/24/13 0801 08/24/13 0809  BP: 123/57  147/115   Pulse: 60  73   Temp: 96.8 F (36 C)  97.2 F (36.2 C)   TempSrc: Oral  Oral   Resp: 16  18   Height:      Weight:  221 lb 1.9 oz (100.3 kg)    SpO2: 96%  96% 99%    Intake/Output Summary (Last 24 hours) at 08/24/13 1056 Last data filed at 08/24/13 0543  Gross per 24 hour  Intake   1510 ml  Output   1650 ml  Net   -140 ml    SUBJECTIVE Feels awful. Complaining about having to have foley placed. No fever, chills. Denies chest pain or SOB.  LABS: Basic Metabolic Panel:  Recent Labs  08/23/13 0300  NA 136*  K 4.0  CL 98  CO2 24  GLUCOSE 162*  BUN 34*  CREATININE 1.45*  CALCIUM 7.7*  CBC:  Recent Labs  08/22/13 0300 08/23/13 0300  WBC 13.8* 11.7*  HGB 10.0* 9.8*  HCT 30.1* 29.1*  MCV 86.2 84.8  PLT 137* 141*    Radiology/Studies:  Ct Abdomen Pelvis Wo Contrast  08/22/2013   CLINICAL DATA:  Left-sided flank pain and fever.  EXAM: CT ABDOMEN AND PELVIS WITHOUT CONTRAST  TECHNIQUE: Multidetector CT imaging of the abdomen and pelvis was performed following the standard protocol without intravenous contrast.  COMPARISON:  No priors.  FINDINGS: Lung Bases: Extensive calcified pleural plaques throughout the lung bases bilaterally, likely related to asbestos related pleural disease. Small left pleural effusion layering dependently. Assessment of the lung parenchyma in the lung bases is limited by considerable respiratory motion, however, there is likely some scarring and perhaps developing rounded atelectasis in the right lung base, particularly in the right lower lobe. Extensive aortic valve calcifications. Atherosclerotic calcifications are noted in the left anterior descending, left circumflex and right coronary arteries. Small hiatal hernia.  Abdomen/Pelvis: Iodinated contrast material is noted in the  collecting systems of the kidneys bilaterally, the ureters bilaterally and within the lumen of the urinary bladder, related to recent cath lab procedure. Mild bilateral perinephric stranding. Low attenuation throughout the hepatic parenchyma, compatible with hepatic steatosis. The unenhanced appearance of the gallbladder, pancreas and bilateral adrenal glands is unremarkable. Spleen is enlarged measuring 10.4 x 16.5 x 15.4 cm. Extensive atherosclerosis throughout the abdominal and pelvic vasculature, without evidence of aneurysm. Numerous colonic diverticulae are noted, without surrounding inflammatory changes to suggest an acute diverticulitis at this time. No significant volume of ascites. No pneumoperitoneum. No pathologic distention of small bowel. No definite lymphadenopathy identified within the abdomen or pelvis on today's non contrast CT examination.  Musculoskeletal: There are no aggressive appearing lytic or blastic lesions noted in the visualized portions of the skeleton.  IMPRESSION: 1. No acute findings in the abdomen or pelvis to account for the patient's symptoms. 2. Colonic diverticulosis without findings to suggest acute diverticulitis at this time. 3. Splenomegaly. 4. Asbestos related pleural disease. 5. Trace left pleural effusion. 6. Extensive atherosclerosis, including at least 3 vessel coronary artery disease. 7. There are calcifications of the aortic valve. Echocardiographic correlation for evaluation of potential valvular dysfunction may be warranted if clinically indicated. 8. Small hiatal hernia. 9. Hepatic steatosis. 10. Additional incidental findings, as above.   Electronically Signed   By: Vinnie Langton M.D.   On: 08/27/2013 16:25   Dg Orthopantogram  08/22/2013   CLINICAL DATA:  Bacteremia  EXAM: ORTHOPANTOGRAM/PANORAMIC  COMPARISON:  None.  FINDINGS: Mandibular rami are unremarkable. The condyles are incompletely included in the field of view. No gross evidence for fracture.  Dental amalgam noted at multiple locations.  IMPRESSION: No gross evidence for mandibular fracture.   Electronically Signed   By: Conchita Paris M.D.   On: 08/22/2013 20:40   Dg Chest 2 View  08/13/2013   CLINICAL DATA:  Shortness of breath, history of COPD, smoking history  EXAM: CHEST  2 VIEW  COMPARISON:  DG CHEST 2 VIEW dated 08/18/2013  FINDINGS: Extensive bilateral calcification of pleural plaques, unchanged. Moderate fibrotic change bilaterally involving the lung parenchyma is stable. There is no acute opacification that was not present previously. There are no pleural effusions. Heart size is mildly enlarged and stable.  IMPRESSION: No acute cardiopulmonary process. Stable chronic changes suggesting asbestos exposure.   Electronically Signed   By: Skipper Cliche M.D.   On: 08/10/2013 11:35   Dg Chest 2 View  08/18/2013   CLINICAL DATA:  Fever.  The symptoms.  EXAM: CHEST  2 VIEW  COMPARISON:  None.  FINDINGS: There are prominent chronic changes of asbestos exposure with densely calcified pleural plaques extending throughout both hemithoraces, sparing the apices. There is coarse reticular opacity in the lungs that suggests associated fibrosis. No convincing pneumonia. No pleural effusion or pneumothorax.  The cardiac silhouette is mildly enlarged normal mediastinal and hilar contours.  Bony thorax is demineralized but intact.  IMPRESSION: 1. No convincing acute cardiopulmonary disease. However, infiltrate superimposed on the extensive chronic changes is not excluded, particularly in the left upper lobe. 2. Chronic changes include extensive calcified pleural plaque and evidence of interstitial fibrosis with the combination of findings suggesting asbestosis.   Electronically Signed   By: Lajean Manes M.D.   On: 08/18/2013 15:19   Ct Chest Wo Contrast  08/23/2013   CLINICAL DATA:  History of asbestos exposure. Evaluate for potential asbestosis.  EXAM: CT CHEST WITHOUT CONTRAST  TECHNIQUE:  Multidetector CT imaging of the chest was performed following the standard protocol without IV contrast.  COMPARISON:  CT of the abdomen and pelvis 08/23/2013.  FINDINGS: Mediastinum: Heart size is mildly enlarged. There is no significant pericardial fluid, thickening or pericardial calcification. Multiple borderline enlarged mediastinal lymph nodes. No definite pathologic mediastinal or hilar nodal enlargement. Please note that accurate exclusion of hilar adenopathy is limited on noncontrast CT scans. Small hiatal hernia with some circumferential thickening of the distal third of the esophagus. There is atherosclerosis of the thoracic aorta, the great vessels of the mediastinum and the coronary arteries, including calcified atherosclerotic plaque in the left main, left anterior descending, left circumflex and right coronary arteries. Severe calcifications of the aortic valve.  Lungs/Pleura: Extensive calcified pleural plaque is again noted throughout the thorax bilaterally, compatible with asbestos related pleural disease. Associated with this there are pleural-based areas of architectural distortion, some of which are slightly masslike, most notable in the right lower lobe and the left upper lobe, favored to reflect areas of developing rounded atelectasis. Several areas of ground-glass attenuation are seen scattered throughout the lungs bilaterally, which are nonspecific, largest of which is in the posterior aspect of the left upper lobe (image 16 of series 3 and image 76 of series [id number]) measuring approximately 2.3 x 1.4 x 3.1 cm. No definite areas of subpleural reticulation, traction bronchiectasis or frank honeycombing are identified at this time.  Upper Abdomen: Referred to contemporaneous CT  of the abdomen and pelvis 08/27/2013 for full description of abdominal findings.  Musculoskeletal: There are no aggressive appearing lytic or blastic lesions noted in the visualized portions of the skeleton.  IMPRESSION:  1. Severe asbestos related pleural disease with multifocal areas of probable developing rounded atelectasis, as detailed above. 2. No definite imaging findings at this time to strongly suggest asbestosis in the lungs, however, given the extent of multifocal ground-glass attenuation, early changes of asbestosis is not excluded, and followup high-resolution chest CT is recommended to assess for temporal changes in the appearance of the lung parenchyma. 3. Amongst the areas of ground-glass attenuation there is one that is slightly mass like in appearance. This is nonspecific, but measures approximately 3.1 x 1.4 x 2.3 cm in the posterior aspect of the left upper lobe. The possibility of an adenocarcinoma is not excluded, and repeat evaluation with high-resolution chest CT in 3 months is recommended at this time to confirm persistence and assess stability. This recommendation follows the consensus statement: Recommendations for the Management of Subsolid Pulmonary Nodules Detected at CT: A Statement from the Fleischner Society as published in Radiology 2013; 266:304-317. 4. Atherosclerosis, including left main and 3 vessel coronary artery disease. Assessment for potential risk factor modification, dietary therapy or pharmacologic therapy may be warranted, if clinically indicated. 5. There are calcifications of the aortic valve. Echocardiographic correlation for evaluation of potential valvular dysfunction may be warranted if clinically indicated. 6. Mild cardiomegaly.   Electronically Signed   By: Vinnie Langton M.D.   On: 08/23/2013 12:14  Ecg: 08/22/13 NSR with incomplete RBBB, Nonspecific ST-T changes.   Echo: Study Conclusions  - Left ventricle: The cavity size was normal. Wall thickness was normal. Systolic function was normal. The estimated ejection fraction was in the range of 55% to 60%. Although no diagnostic regional wall motion abnormality was identified, this possibility cannot be completely  excluded on the basis of this study. Features are consistent with a pseudonormal left ventricular filling pattern, with concomitant abnormal relaxation and increased filling pressure (grade 2 diastolic dysfunction). - Aortic valve: There was moderate stenosis. Valve area: 1.28cm^2(VTI).   PHYSICAL EXAM General: Well developed, obese, in no acute distress. Head: Normal, sclera non-icteric. Neck: Negative for carotid bruits. JVD not elevated. Lungs: Bibasilar  Wheezes/crackles. Heart: RRR S1 S2 with 2/6 systolic murmur RUSB. Abdomen: Soft, non-tender, non-distended with normoactive bowel sounds. No hepatomegaly.  No obvious abdominal masses. Extremities: No clubbing, cyanosis or edema.  Distal pedal pulses are 2+ and equal bilaterally. Neuro: Alert and oriented X 3. Moves all extremities spontaneously.   ASSESSMENT AND PLAN: 1. Coagulase negative staph bacteremia. Cultures still positive. Continue antibiotics per ID. Cannot place PICC line until bacteremia clears. Patient is at increased risk for endocarditis of the AV. Plan is to complete 6 week course of antibiotics. If unable to clear bacteremia may need to consider TEE but currently this will not alter therapy. 2. NSTEMI. 2 vessel obstructive CAD by cath with severe ostial LCx stenosis and moderate disease in the mid LAD. Appreciate Dr. Lucianne Lei Trigt's input. He is a poor surgical candidate. I reviewed with Dr. Burt Knack. The lesions are suitable for PCI but need to wait until infection cleared. Ideally would wait and bring him back as an outpatient once infection treatment completed. If he becomes unstable from a cardiac standpoint would need to reevaluate. Will add Plavix 75 mg daily. Increase metoprolol. Add Imdur 30 mg daily. 3. Moderate aortic stenosis. 4. COPD/asbestosis. 5. Hyperlipidemia. On statin Rx. 6.  Bladder obstruction. Now with foley in place. Hopefully once infection cleared and ambulatory can try and remove foley. 7. Acute  renal insufficiency. Baseline creatinine 1.0. Now 1.8>>1.5>>1.45. 8. DM type 2.    Principal Problem:   Bacteremia due to Staphylococcus Active Problems:   COPD (chronic obstructive pulmonary disease) with emphysema   Diabetes mellitus type 2, diet-controlled   Aortic stenosis   HTN (hypertension)   HLD (hyperlipidemia)   Elevated PSA   Heme positive stool   NSTEMI (non-ST elevated myocardial infarction)   Acute renal failure   CAD (coronary artery disease)   Normocytic anemia   Diastolic CHF   Asbestosis   Atherosclerosis   Splenomegaly   Nephrolithiasis   DJD (degenerative joint disease)    Signed, Renae Mottley [place] MD,FACC 08/24/2013 11:13 AM

## 2013-08-24 NOTE — Progress Notes (Signed)
ANTIBIOTIC CONSULT NOTE - FOLLOW UP  Pharmacy Consult for Vancomycin  Indication: bacteremia  Allergies  Allergen Reactions  . Sulfa Antibiotics Nausea Only    Patient Measurements: Height: 5\' 6"  (167.6 cm) Weight: 221 lb 1.9 oz (100.3 kg) IBW/kg (Calculated) : 63.8  Vital Signs: Temp: 98.8 F (37.1 C) (02/16 1149) Temp src: Oral (02/16 1149) BP: 109/44 mmHg (02/16 1149) Pulse Rate: 72 (02/16 1149) Intake/Output from previous day: 02/15 0701 - 02/16 0700 In: 1822 [P.O.:1300; I.V.:72; IV Piggyback:450] Out: 1900 [Urine:1900] Intake/Output from this shift: Total I/O In: 120 [P.O.:120] Out: 1250 [Urine:1250]  Labs:  Recent Labs  08/22/13 0300 08/23/13 0300  WBC 13.8* 11.7*  HGB 10.0* 9.8*  PLT 137* 141*  CREATININE  --  1.45*   Estimated Creatinine Clearance: 49.6 ml/min (by C-G formula based on Cr of 1.45).  Recent Labs  08/24/13 1220  [place] 15.6     Microbiology: Recent Results (from the past 720 hour(s))  MRSA PCR SCREENING     Status: None   Collection Time    08/14/2013  4:12 PM      Result Value Ref Range Status   MRSA by PCR NEGATIVE  NEGATIVE Final   Comment:            The GeneXpert MRSA Assay (FDA     approved for NASAL specimens     only), is one component of a     comprehensive MRSA colonization     surveillance program. It is not     intended to diagnose MRSA     infection nor to guide or     monitor treatment for     MRSA infections.  CULTURE, BLOOD (ROUTINE X 2)     Status: None   Collection Time    08/24/2013  3:30 AM      Result Value Ref Range Status   Specimen Description BLOOD LEFT HAND   Final   Special Requests BOTTLES DRAWN AEROBIC AND ANAEROBIC 10CC EACH   Final   Culture  Setup Time     Final   Value: 08/25/2013 08:25     Performed at [facility]   Culture     Final   Value: STAPHYLOCOCCUS SPECIES (COAGULASE NEGATIVE)     Note: RIFAMPIN AND GENTAMICIN SHOULD NOT BE USED AS SINGLE DRUGS FOR TREATMENT OF STAPH  INFECTIONS.     Note: Gram Stain Report Called to,Read Back By and Verified With: CHRIS BUNGQUE ON 08/22/2013 AT 12:49A BY Dennard Nip     Performed at [facility]   Report Status 08/24/2013 FINAL   Final   Organism ID, Bacteria STAPHYLOCOCCUS SPECIES (COAGULASE NEGATIVE)   Final  CULTURE, BLOOD (ROUTINE X 2)     Status: None   Collection Time    08/31/2013  3:36 AM      Result Value Ref Range Status   Specimen Description BLOOD LEFT HAND   Final   Special Requests BOTTLES DRAWN AEROBIC AND ANAEROBIC 10CC EACH   Final   Culture  Setup Time     Final   Value: 08/23/2013 08:25     Performed at [facility]   Culture     Final   Value: STAPHYLOCOCCUS SPECIES (COAGULASE NEGATIVE)     Note: SUSCEPTIBILITIES PERFORMED ON PREVIOUS CULTURE WITHIN THE LAST 5 DAYS.     Note: Gram Stain Report Called to,Read Back By and Verified With: CHRIS BUNGQUE ON 08/22/2013 AT 12:49A BY WILEJ     Performed at  [facility]   Report Status 08/24/2013 FINAL   Final  CULTURE, BLOOD (ROUTINE X 2)     Status: None   Collection Time    08/22/13  2:00 AM      Result Value Ref Range Status   Specimen Description BLOOD RIGHT ARM   Final   Special Requests BOTTLES DRAWN AEROBIC AND ANAEROBIC 10CC   Final   Culture  Setup Time     Final   Value: 08/22/2013 12:10     Performed at [facility]   Culture     Final   Value: GRAM POSITIVE COCCI IN CLUSTERS     Note: Gram Stain Report Called to,Read Back By and Verified With: [place] Hodgin RN on 08/23/13 at 02:20 by Rise Mu     Performed at [facility]   Report Status PENDING   Incomplete  CULTURE, BLOOD (ROUTINE X 2)     Status: None   Collection Time    08/22/13  2:15 AM      Result Value Ref Range Status   Specimen Description BLOOD LEFT HAND   Final   Special Requests     Final   Value: BOTTLES DRAWN AEROBIC AND ANAEROBIC 10CC AER,5CC ANA   Culture  Setup Time     Final   Value: 08/22/2013 12:10     Performed at [facility]   Culture     Final   Value: STAPHYLOCOCCUS SPECIES (COAGULASE NEGATIVE)     Note: RIFAMPIN AND GENTAMICIN SHOULD NOT BE USED AS SINGLE DRUGS FOR TREATMENT OF STAPH INFECTIONS.     Note: Gram Stain Report Called to,Read Back By and Verified With: Dellie Burns RN on 08/23/13 at 01:25 by Rise Mu     Performed at [facility]   Report Status PENDING   Incomplete    Anti-infectives   Start     Dose/Rate Route Frequency Ordered Stop   08/22/13 1600  ceFAZolin (ANCEF) IVPB 2 g/50 mL premix  Status:  Discontinued     2 g 100 mL/hr over 30 Minutes Intravenous 3 times per day 08/22/13 1552 08/24/13 1457   08/22/13 1200  vancomycin (VANCOCIN) IVPB 750 mg/150 ml premix     750 mg 150 mL/hr over 60 Minutes Intravenous Every 12 hours 08/22/13 0125     08/22/13 0130  vancomycin (VANCOCIN) IVPB 750 mg/150 ml premix     750 mg 150 mL/hr over 60 Minutes Intravenous  Once 08/22/13 0125 08/22/13 0315   08/14/2013 1000  levofloxacin (LEVAQUIN) tablet 500 mg  Status:  Discontinued     500 mg Oral Daily 08/25/2013 1627 08/22/13 1552      Assessment: 73 yo male admitted 2/12 for NSTEMI now with coagulase negative Staph bacteremia started on IV Abx 2/14. Patient currently afebrile, wbc trending down to 11, and scr improving. CoNS is methicillin resistant, per ID recommendations ancef has been discontinued today.   Vancomycin trough currently at goal at 15.6.   2/13 LVQ>>2/14 2/14 Vanc>> 2/14 Cefazolin>>2/16  2/16 BCx2>> 2/14 BCx2>>CoNS 2/13 BCx2>>CoNS-sen to vanc 2/12 MRSA PCR>>NEG  Goal of Therapy:  Vancomycin trough level 15-20 mcg/ml  Plan:  Continue vancomycin 750 mg IV Q12h Monitor CBC, Cx data Recheck vancomycin trough later in the week to ensure appropriate dosing  Zannie Cove, PharmD Candidate [place]  08/24/2013,3:10 PM  I have reviewed and edited the above note and agree with the student's assessment and plan.   Pilar Plate  Redmond Pulling PharmD.,  BCPS Clinical Pharmacist Pager [phone] 08/24/2013 3:42 PM

## 2013-08-24 NOTE — Progress Notes (Signed)
Upon doing shift assessment, family at bedside alerts RN that pt has been complaining of chest pain and Lt arm pain intermittently during the day. When RN asked pt if he was having any or had had any CP or Lt arm pain pt denies. Family at bedside states that pt will not tell staff if he is having pain because he believes that if he does not tell us then he will be able to go home soon. Pt and family was advised and educated on the importance of letting staff know if he was experiencing CP or arm pain. On-call MD called and made aware. New orders received and followed. Will continue to monitor pt closely.

## 2013-08-24 NOTE — Progress Notes (Signed)
Patient ID: Anthony Turner, male   DOB: 1940-08-14, 73 y.o.   MRN: [id number]         [facility]    Date of Admission:  09/03/2013   Total days of antibiotics 6        Day 3 vancomycin        Day 3 cefazolin         Principal Problem:   Bacteremia due to Staphylococcus Active Problems:   Aortic stenosis   NSTEMI (non-ST elevated myocardial infarction)   COPD (chronic obstructive pulmonary disease) with emphysema   Diabetes mellitus type 2, diet-controlled   HTN (hypertension)   HLD (hyperlipidemia)   Elevated PSA   Heme positive stool   Acute renal failure   CAD (coronary artery disease)   Normocytic anemia   Diastolic CHF   Asbestosis   Atherosclerosis   Splenomegaly   Nephrolithiasis   DJD (degenerative joint disease)   . aspirin EC  81 mg Oral Daily  . budesonide-formoterol  2 puff Inhalation BID  .  ceFAZolin (ANCEF) IV  2 g Intravenous 3 times per day  . clopidogrel  75 mg Oral Q breakfast  . docusate sodium  100 mg Oral Daily  . furosemide  40 mg Oral Daily  . heparin subcutaneous  5,000 Units Subcutaneous 3 times per day  . insulin aspart  0-9 Units Subcutaneous TID WC  . isosorbide mononitrate  30 mg Oral Daily  . levalbuterol  0.63 mg Nebulization Q6H  . metoprolol tartrate  25 mg Oral BID  . nystatin  5 mL Oral QID  . simvastatin  40 mg Oral q1800  . tiotropium  18 mcg Inhalation Daily  . vancomycin  750 mg Intravenous Q12H    Subjective: He says he is beginning to feel a little bit better.  Past Medical History  Diagnosis Date  . COPD (chronic obstructive pulmonary disease)   . Type 2 diabetes mellitus, controlled   . Asthma   . Arthritis     with DIP nodules  . History of diverticulitis of colon   . Seasonal allergic rhinitis   . Aortic stenosis     mod by last echo (04/2013) with EF 55%  . HTN (hypertension)   . HLD (hyperlipidemia)   . 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 Asbestos exposure     History  Substance Use Topics  . Smoking status: Former Smoker -- 4.00 packs/day for 35 years    Types: Cigarettes    Quit date: 02/21/1982  . Smokeless tobacco: Never Used  . Alcohol Use: Yes     Comment: Occasional    Family History  Problem Relation Age of Onset  . Alcohol abuse Father     Allergies  Allergen Reactions  . Sulfa Antibiotics Nausea Only    Objective: Temp:  [96.8 F (36 C)-99.3 F (37.4 C)] 98.8 F (37.1 C) (02/16 1149) Pulse Rate:  [60-78] 72 (02/16 1149) Resp:  [14-20] 20 (02/16 1149) BP: (109-147)/(44-115) 109/44 mmHg (02/16 1149) SpO2:  [95 %-99 %] 95 % (02/16 1313) Weight:  [100.3 kg (221 lb 1.9 oz)] 100.3 kg (221 lb 1.9 oz) (02/16 0500)  General: He is sleeping but arouses easily Lungs: Clear Cor: Regular S1-S2 with a 2/6 systolic murmur at the right sternal border  Lab Results Lab Results  Component Value Date   WBC 11.7* 08/23/2013   HGB 9.8* 08/23/2013  HCT 29.1* 08/23/2013   MCV 84.8 08/23/2013   PLT 141* 08/23/2013    Lab Results  Component Value Date   CREATININE 1.45* 08/23/2013   BUN 34* 08/23/2013   NA 136* 08/23/2013   K 4.0 08/23/2013   CL 98 08/23/2013   CO2 24 08/23/2013    Lab Results  Component Value Date   ALT 51 06/23/2013   AST 45* 06/23/2013   ALKPHOS 81 06/23/2013   BILITOT 0.5 06/23/2013      Microbiology: Recent Results (from the past 240 hour(s))  MRSA PCR SCREENING     Status: None   Collection Time    08/27/2013  4:12 PM      Result Value Ref Range Status   MRSA by PCR NEGATIVE  NEGATIVE Final   Comment:            The GeneXpert MRSA Assay (FDA     approved for NASAL specimens     only), is one component of a     comprehensive MRSA colonization     surveillance program. It is not     intended to diagnose MRSA     infection nor to guide or     monitor treatment for     MRSA infections.  CULTURE, BLOOD (ROUTINE X 2)     Status: None   Collection Time    08/09/2013  3:30 AM       Result Value Ref Range Status   Specimen Description BLOOD LEFT HAND   Final   Special Requests BOTTLES DRAWN AEROBIC AND ANAEROBIC 10CC EACH   Final   Culture  Setup Time     Final   Value: 08/30/2013 08:25     Performed at [facility]   Culture     Final   Value: STAPHYLOCOCCUS SPECIES (COAGULASE NEGATIVE)     Note: RIFAMPIN AND GENTAMICIN SHOULD NOT BE USED AS SINGLE DRUGS FOR TREATMENT OF STAPH INFECTIONS.     Note: Gram Stain Report Called to,Read Back By and Verified With: CHRIS BUNGQUE ON 08/22/2013 AT 12:49A BY Dennard Nip     Performed at [facility]   Report Status 08/24/2013 FINAL   Final   Organism ID, Bacteria STAPHYLOCOCCUS SPECIES (COAGULASE NEGATIVE)   Final  CULTURE, BLOOD (ROUTINE X 2)     Status: None   Collection Time    08/27/2013  3:36 AM      Result Value Ref Range Status   Specimen Description BLOOD LEFT HAND   Final   Special Requests BOTTLES DRAWN AEROBIC AND ANAEROBIC 10CC EACH   Final   Culture  Setup Time     Final   Value: 09/03/2013 08:25     Performed at [facility]   Culture     Final   Value: STAPHYLOCOCCUS SPECIES (COAGULASE NEGATIVE)     Note: SUSCEPTIBILITIES PERFORMED ON PREVIOUS CULTURE WITHIN THE LAST 5 DAYS.     Note: Gram Stain Report Called to,Read Back By and Verified With: CHRIS BUNGQUE ON 08/22/2013 AT 12:49A BY Dennard Nip     Performed at [facility]   Report Status 08/24/2013 FINAL   Final  CULTURE, BLOOD (ROUTINE X 2)     Status: None   Collection Time    08/22/13  2:00 AM      Result Value Ref Range Status   Specimen Description BLOOD RIGHT ARM   Final   Special Requests BOTTLES DRAWN AEROBIC AND ANAEROBIC 10CC   Final   Culture  Setup Time     Final   Value: 08/22/2013 12:10     Performed at [facility]   Culture     Final   Value: GRAM POSITIVE COCCI IN CLUSTERS     Note: Gram Stain Report Called to,Read Back By and Verified With: [place] Hodgin RN on 08/23/13 at 02:20 by Rise Mu      Performed at [facility]   Report Status PENDING   Incomplete  CULTURE, BLOOD (ROUTINE X 2)     Status: None   Collection Time    08/22/13  2:15 AM      Result Value Ref Range Status   Specimen Description BLOOD LEFT HAND   Final   Special Requests     Final   Value: BOTTLES DRAWN AEROBIC AND ANAEROBIC 10CC AER,5CC ANA   Culture  Setup Time     Final   Value: 08/22/2013 12:10     Performed at [facility]   Culture     Final   Value: STAPHYLOCOCCUS SPECIES (COAGULASE NEGATIVE)     Note: RIFAMPIN AND GENTAMICIN SHOULD NOT BE USED AS SINGLE DRUGS FOR TREATMENT OF STAPH INFECTIONS.     Note: Gram Stain Report Called to,Read Back By and Verified With: Dellie Burns RN on 08/23/13 at 01:25 by Rise Mu     Performed at [facility]   Report Status PENDING   Incomplete    Studies/Results: Dg Orthopantogram  08/22/2013   CLINICAL DATA:  Bacteremia  EXAM: ORTHOPANTOGRAM/PANORAMIC  COMPARISON:  None.  FINDINGS: Mandibular rami are unremarkable. The condyles are incompletely included in the field of view. No gross evidence for fracture. Dental amalgam noted at multiple locations.  IMPRESSION: No gross evidence for mandibular fracture.   Electronically Signed   By: Conchita Paris M.D.   On: 08/22/2013 20:40   Ct Chest Wo Contrast  08/23/2013   CLINICAL DATA:  History of asbestos exposure. Evaluate for potential asbestosis.  EXAM: CT CHEST WITHOUT CONTRAST  TECHNIQUE: Multidetector CT imaging of the chest was performed following the standard protocol without IV contrast.  COMPARISON:  CT of the abdomen and pelvis 09/03/2013.  FINDINGS: Mediastinum: Heart size is mildly enlarged. There is no significant pericardial fluid, thickening or pericardial calcification. Multiple borderline enlarged mediastinal lymph nodes. No definite pathologic mediastinal or hilar nodal enlargement. Please note that accurate exclusion of hilar adenopathy is limited on noncontrast CT scans. Small hiatal  hernia with some circumferential thickening of the distal third of the esophagus. There is atherosclerosis of the thoracic aorta, the great vessels of the mediastinum and the coronary arteries, including calcified atherosclerotic plaque in the left main, left anterior descending, left circumflex and right coronary arteries. Severe calcifications of the aortic valve.  Lungs/Pleura: Extensive calcified pleural plaque is again noted throughout the thorax bilaterally, compatible with asbestos related pleural disease. Associated with this there are pleural-based areas of architectural distortion, some of which are slightly masslike, most notable in the right lower lobe and the left upper lobe, favored to reflect areas of developing rounded atelectasis. Several areas of ground-glass attenuation are seen scattered throughout the lungs bilaterally, which are nonspecific, largest of which is in the posterior aspect of the left upper lobe (image 16 of series 3 and image 76 of series [id number]) measuring approximately 2.3 x 1.4 x 3.1 cm. No definite areas of subpleural reticulation, traction bronchiectasis or frank honeycombing are identified at this time.  Upper Abdomen: Referred to contemporaneous CT of the abdomen  and pelvis 08/31/2013 for full description of abdominal findings.  Musculoskeletal: There are no aggressive appearing lytic or blastic lesions noted in the visualized portions of the skeleton.  IMPRESSION: 1. Severe asbestos related pleural disease with multifocal areas of probable developing rounded atelectasis, as detailed above. 2. No definite imaging findings at this time to strongly suggest asbestosis in the lungs, however, given the extent of multifocal ground-glass attenuation, early changes of asbestosis is not excluded, and followup high-resolution chest CT is recommended to assess for temporal changes in the appearance of the lung parenchyma. 3. Amongst the areas of ground-glass attenuation there is one that  is slightly mass like in appearance. This is nonspecific, but measures approximately 3.1 x 1.4 x 2.3 cm in the posterior aspect of the left upper lobe. The possibility of an adenocarcinoma is not excluded, and repeat evaluation with high-resolution chest CT in 3 months is recommended at this time to confirm persistence and assess stability. This recommendation follows the consensus statement: Recommendations for the Management of Subsolid Pulmonary Nodules Detected at CT: A Statement from the Fleischner Society as published in Radiology 2013; 266:304-317. 4. Atherosclerosis, including left main and 3 vessel coronary artery disease. Assessment for potential risk factor modification, dietary therapy or pharmacologic therapy may be warranted, if clinically indicated. 5. There are calcifications of the aortic valve. Echocardiographic correlation for evaluation of potential valvular dysfunction may be warranted if clinically indicated. 6. Mild cardiomegaly.   Electronically Signed   By: Vinnie Langton M.D.   On: 08/23/2013 12:14    Assessment: Four of 4 admission blood cultures are growing methicillin-resistant coagulase-negative staph. He is at high risk for aortic valve endocarditis. I will continue vancomycin alone for now.  Plan: 1. Continue vancomycin; discontinue cefazolin 2. Await results of repeat blood cultures done this morning 3. Hold off on placing PICC until blood cultures are negative  Michel Bickers, MD [facility] [place] [phone] pager   [phone] cell 08/24/2013, 2:54 PM

## 2013-08-25 ENCOUNTER — Inpatient Hospital Stay (HOSPITAL_COMMUNITY): Payer: Medicare Other

## 2013-08-25 ENCOUNTER — Ambulatory Visit: Payer: Medicare Other | Admitting: Internal Medicine

## 2013-08-25 DIAGNOSIS — I5030 Unspecified diastolic (congestive) heart failure: Secondary | ICD-10-CM

## 2013-08-25 DIAGNOSIS — A4902 Methicillin resistant Staphylococcus aureus infection, unspecified site: Secondary | ICD-10-CM

## 2013-08-25 DIAGNOSIS — I509 Heart failure, unspecified: Secondary | ICD-10-CM

## 2013-08-25 LAB — COMPREHENSIVE METABOLIC PANEL
ALBUMIN: 2.8 g/dL — AB (ref 3.5–5.2)
ALT: 6 U/L (ref 0–53)
AST: 19 U/L (ref 0–37)
Alkaline Phosphatase: 119 U/L — ABNORMAL HIGH (ref 39–117)
BUN: 29 mg/dL — AB (ref 6–23)
CHLORIDE: 95 meq/L — AB (ref 96–112)
CO2: 25 mEq/L (ref 19–32)
CREATININE: 1.23 mg/dL (ref 0.50–1.35)
Calcium: 8.1 mg/dL — ABNORMAL LOW (ref 8.4–10.5)
GFR calc Af Amer: 65 mL/min — ABNORMAL LOW (ref >90)
GFR calc non Af Amer: 56 mL/min — ABNORMAL LOW (ref >90)
Glucose, Bld: 142 mg/dL — ABNORMAL HIGH (ref 70–99)
Potassium: 3.9 mEq/L (ref 3.7–5.3)
Sodium: 135 mEq/L — ABNORMAL LOW (ref 137–147)
TOTAL PROTEIN: 6.7 g/dL (ref 6.0–8.3)
Total Bilirubin: 0.4 mg/dL (ref 0.3–1.2)

## 2013-08-25 LAB — CBC WITH DIFFERENTIAL/PLATELET
Basophils Absolute: 0.1 10*3/uL (ref 0.0–0.1)
Basophils Relative: 0 % (ref 0–1)
EOS ABS: 0.3 10*3/uL (ref 0.0–0.7)
EOS PCT: 2 % (ref 0–5)
HEMATOCRIT: 30.3 % — AB (ref 39.0–52.0)
HEMOGLOBIN: 10.2 g/dL — AB (ref 13.0–17.0)
LYMPHS ABS: 0.7 10*3/uL (ref 0.7–4.0)
LYMPHS PCT: 5 % — AB (ref 12–46)
MCH: 28.6 pg (ref 26.0–34.0)
MCHC: 33.7 g/dL (ref 30.0–36.0)
MCV: 84.9 fL (ref 78.0–100.0)
MONO ABS: 1.4 10*3/uL — AB (ref 0.1–1.0)
MONOS PCT: 9 % (ref 3–12)
Neutro Abs: 13.1 10*3/uL — ABNORMAL HIGH (ref 1.7–7.7)
Neutrophils Relative %: 84 % — ABNORMAL HIGH (ref 43–77)
Platelets: 191 10*3/uL (ref 150–400)
RBC: 3.57 MIL/uL — AB (ref 4.22–5.81)
RDW: 14.7 % (ref 11.5–15.5)
WBC: 15.5 10*3/uL — AB (ref 4.0–10.5)

## 2013-08-25 LAB — CULTURE, BLOOD (ROUTINE X 2)

## 2013-08-25 LAB — TROPONIN I: Troponin I: <0.3 ng/mL (ref <0.30)

## 2013-08-25 LAB — GLUCOSE, CAPILLARY
Glucose-Capillary: 195 mg/dL — ABNORMAL HIGH (ref 70–99)
Glucose-Capillary: 149 mg/dL — ABNORMAL HIGH (ref 70–99)

## 2013-08-25 MED ORDER — BISACODYL 10 MG RE SUPP
10.0000 mg | Freq: Once | RECTAL | Status: AC
  Administered 2013-08-25: 10 mg via RECTAL
  Filled 2013-08-25: qty 1

## 2013-08-25 MED ORDER — POLYETHYLENE GLYCOL 3350 17 G PO PACK
17.0000 g | PACK | Freq: Every day | ORAL | Status: DC | PRN
  Administered 2013-08-25: 17 g via ORAL
  Filled 2013-08-25: qty 1

## 2013-08-25 MED ORDER — FLEET ENEMA 7-19 GM/118ML RE ENEM
1.0000 | ENEMA | Freq: Every day | RECTAL | Status: DC | PRN
  Administered 2013-08-25: 1 via RECTAL
  Filled 2013-08-25 (×2): qty 1

## 2013-08-25 NOTE — Progress Notes (Signed)
TELEMETRY: Reviewed telemetry pt in NSR rare PVCs : Filed Vitals:   08/25/13 0500 08/25/13 0510 08/25/13 0756 08/25/13 0843  BP:      Pulse:      Temp:   99.3 F (37.4 C)   TempSrc:   Oral   Resp:      Height:      Weight: 219 lb 2.2 oz (99.4 kg)     SpO2:  96% 98% 97%    Intake/Output Summary (Last 24 hours) at 08/25/13 1010 Last data filed at 08/25/13 0800  Gross per 24 hour  Intake 935.35 ml  Output   2500 ml  Net -1564.65 ml    SUBJECTIVE Feels awful. Complains of pain all over. Had chest pain last night and was placed back on IV Ntg.  No fever, chills. No bowel movement in one week.  LABS: Basic Metabolic Panel:  Recent Labs  08/23/13 0300  NA 136*  K 4.0  CL 98  CO2 24  GLUCOSE 162*  BUN 34*  CREATININE 1.45*  CALCIUM 7.7*  CBC:  Recent Labs  08/23/13 0300  WBC 11.7*  HGB 9.8*  HCT 29.1*  MCV 84.8  PLT 141*    Radiology/Studies:  Ct Abdomen Pelvis Wo Contrast  08/29/2013   CLINICAL DATA:  Left-sided flank pain and fever.  EXAM: CT ABDOMEN AND PELVIS WITHOUT CONTRAST  TECHNIQUE: Multidetector CT imaging of the abdomen and pelvis was performed following the standard protocol without intravenous contrast.  COMPARISON:  No priors.  FINDINGS: Lung Bases: Extensive calcified pleural plaques throughout the lung bases bilaterally, likely related to asbestos related pleural disease. Small left pleural effusion layering dependently. Assessment of the lung parenchyma in the lung bases is limited by considerable respiratory motion, however, there is likely some scarring and perhaps developing rounded atelectasis in the right lung base, particularly in the right lower lobe. Extensive aortic valve calcifications. Atherosclerotic calcifications are noted in the left anterior descending, left circumflex and right coronary arteries. Small hiatal hernia.  Abdomen/Pelvis: Iodinated contrast material is noted in the collecting systems of the kidneys bilaterally, the ureters  bilaterally and within the lumen of the urinary bladder, related to recent cath lab procedure. Mild bilateral perinephric stranding. Low attenuation throughout the hepatic parenchyma, compatible with hepatic steatosis. The unenhanced appearance of the gallbladder, pancreas and bilateral adrenal glands is unremarkable. Spleen is enlarged measuring 10.4 x 16.5 x 15.4 cm. Extensive atherosclerosis throughout the abdominal and pelvic vasculature, without evidence of aneurysm. Numerous colonic diverticulae are noted, without surrounding inflammatory changes to suggest an acute diverticulitis at this time. No significant volume of ascites. No pneumoperitoneum. No pathologic distention of small bowel. No definite lymphadenopathy identified within the abdomen or pelvis on today's non contrast CT examination.  Musculoskeletal: There are no aggressive appearing lytic or blastic lesions noted in the visualized portions of the skeleton.  IMPRESSION: 1. No acute findings in the abdomen or pelvis to account for the patient's symptoms. 2. Colonic diverticulosis without findings to suggest acute diverticulitis at this time. 3. Splenomegaly. 4. Asbestos related pleural disease. 5. Trace left pleural effusion. 6. Extensive atherosclerosis, including at least 3 vessel coronary artery disease. 7. There are calcifications of the aortic valve. Echocardiographic correlation for evaluation of potential valvular dysfunction may be warranted if clinically indicated. 8. Small hiatal hernia. 9. Hepatic steatosis. 10. Additional incidental findings, as above.   Electronically Signed   By: Vinnie Langton M.D.   On: 08/27/2013 16:25   Dg Orthopantogram  08/22/2013  CLINICAL DATA:  Bacteremia  EXAM: ORTHOPANTOGRAM/PANORAMIC  COMPARISON:  None.  FINDINGS: Mandibular rami are unremarkable. The condyles are incompletely included in the field of view. No gross evidence for fracture. Dental amalgam noted at multiple locations.  IMPRESSION: No  gross evidence for mandibular fracture.   Electronically Signed   By: Conchita Paris M.D.   On: 08/22/2013 20:40   Dg Chest 2 View  09/03/2013   CLINICAL DATA:  Shortness of breath, history of COPD, smoking history  EXAM: CHEST  2 VIEW  COMPARISON:  DG CHEST 2 VIEW dated 08/18/2013  FINDINGS: Extensive bilateral calcification of pleural plaques, unchanged. Moderate fibrotic change bilaterally involving the lung parenchyma is stable. There is no acute opacification that was not present previously. There are no pleural effusions. Heart size is mildly enlarged and stable.  IMPRESSION: No acute cardiopulmonary process. Stable chronic changes suggesting asbestos exposure.   Electronically Signed   By: Skipper Cliche M.D.   On: 08/18/2013 11:35   Ct Chest Wo Contrast  08/23/2013   CLINICAL DATA:  History of asbestos exposure. Evaluate for potential asbestosis.  EXAM: CT CHEST WITHOUT CONTRAST  TECHNIQUE: Multidetector CT imaging of the chest was performed following the standard protocol without IV contrast.  COMPARISON:  CT of the abdomen and pelvis 08/17/2013.  FINDINGS: Mediastinum: Heart size is mildly enlarged. There is no significant pericardial fluid, thickening or pericardial calcification. Multiple borderline enlarged mediastinal lymph nodes. No definite pathologic mediastinal or hilar nodal enlargement. Please note that accurate exclusion of hilar adenopathy is limited on noncontrast CT scans. Small hiatal hernia with some circumferential thickening of the distal third of the esophagus. There is atherosclerosis of the thoracic aorta, the great vessels of the mediastinum and the coronary arteries, including calcified atherosclerotic plaque in the left main, left anterior descending, left circumflex and right coronary arteries. Severe calcifications of the aortic valve.  Lungs/Pleura: Extensive calcified pleural plaque is again noted throughout the thorax bilaterally, compatible with asbestos related pleural  disease. Associated with this there are pleural-based areas of architectural distortion, some of which are slightly masslike, most notable in the right lower lobe and the left upper lobe, favored to reflect areas of developing rounded atelectasis. Several areas of ground-glass attenuation are seen scattered throughout the lungs bilaterally, which are nonspecific, largest of which is in the posterior aspect of the left upper lobe (image 16 of series 3 and image 76 of series [id number]) measuring approximately 2.3 x 1.4 x 3.1 cm. No definite areas of subpleural reticulation, traction bronchiectasis or frank honeycombing are identified at this time.  Upper Abdomen: Referred to contemporaneous CT of the abdomen and pelvis 08/12/2013 for full description of abdominal findings.  Musculoskeletal: There are no aggressive appearing lytic or blastic lesions noted in the visualized portions of the skeleton.  IMPRESSION: 1. Severe asbestos related pleural disease with multifocal areas of probable developing rounded atelectasis, as detailed above. 2. No definite imaging findings at this time to strongly suggest asbestosis in the lungs, however, given the extent of multifocal ground-glass attenuation, early changes of asbestosis is not excluded, and followup high-resolution chest CT is recommended to assess for temporal changes in the appearance of the lung parenchyma. 3. Amongst the areas of ground-glass attenuation there is one that is slightly mass like in appearance. This is nonspecific, but measures approximately 3.1 x 1.4 x 2.3 cm in the posterior aspect of the left upper lobe. The possibility of an adenocarcinoma is not excluded, and repeat evaluation with high-resolution chest CT  in 3 months is recommended at this time to confirm persistence and assess stability. This recommendation follows the consensus statement: Recommendations for the Management of Subsolid Pulmonary Nodules Detected at CT: A Statement from the Fleischner  Society as published in Radiology 2013; 266:304-317. 4. Atherosclerosis, including left main and 3 vessel coronary artery disease. Assessment for potential risk factor modification, dietary therapy or pharmacologic therapy may be warranted, if clinically indicated. 5. There are calcifications of the aortic valve. Echocardiographic correlation for evaluation of potential valvular dysfunction may be warranted if clinically indicated. 6. Mild cardiomegaly.   Electronically Signed   By: Vinnie Langton M.D.   On: 08/23/2013 12:14  Ecg: 08/22/13 NSR with incomplete RBBB, Nonspecific ST-T changes.   Echo: Study Conclusions  - Left ventricle: The cavity size was normal. Wall thickness was normal. Systolic function was normal. The estimated ejection fraction was in the range of 55% to 60%. Although no diagnostic regional wall motion abnormality was identified, this possibility cannot be completely excluded on the basis of this study. Features are consistent with a pseudonormal left ventricular filling pattern, with concomitant abnormal relaxation and increased filling pressure (grade 2 diastolic dysfunction). - Aortic valve: There was moderate stenosis. Valve area: 1.28cm^2(VTI).   PHYSICAL EXAM General: Well developed, obese, in moderate distress. Writhing in bed and moaning. Head: Normal, sclera non-icteric. Neck: Negative for carotid bruits. JVD not elevated. Lungs: Bilateral diffuse  Wheezes/crackles. Heart: RRR S1 S2 with 2/6 systolic murmur RUSB. Abdomen: Soft, obese, distended with decreased bowel sounds. No hepatomegaly.  No obvious abdominal masses. Extremities: No clubbing, cyanosis or edema.  Distal pedal pulses are 2+ and equal bilaterally. Neuro: Alert and oriented X 3. Moves all extremities spontaneously.   ASSESSMENT AND PLAN: 1. Coagulase negative staph bacteremia. MRSA. Awaiting cultures from yesterday. Continue Vancomycin per ID. Cannot place PICC line until bacteremia clears.  Patient is at increased risk for endocarditis of the AV. Plan is to complete 6 week course of antibiotics. If unable to clear bacteremia may need to consider TEE but currently this will not alter therapy. 2. NSTEMI. 2 vessel obstructive CAD by cath with severe ostial LCx stenosis and moderate disease in the mid LAD. Appreciate Dr. Lucianne Lei Trigt's input. He is not a  surgical candidate. The coronary lesions are suitable for PCI but need to wait until infection cleared. Ideally would wait and bring him back as an outpatient once infection treatment completed. If he becomes unstable from a cardiac standpoint would need to reevaluate. Currently on ASA, Plavix, metoprolol and IV Ntg. It is not clear if his current chest pain is anginal since he hurts all over. Will repeat Ecg and troponin this am.  3. Moderate aortic stenosis. 4. COPD/asbestosis. Increased respiratory distress this am. Oxygenating well. Will repeat CXR. Continue breathing treatments. I think a lot of his breathing difficulty is related to increased abdominal distension.  5. Hyperlipidemia. On statin Rx. 6. Bladder obstruction. Now with foley in place. Hopefully once infection cleared and ambulatory can try and remove foley. 7. Acute renal insufficiency. Baseline creatinine 1.0. Now 1.8>>1.5>>1.45. Repeat chemistry today. 8. DM type 2.  9. Constipation. Will check flat plate abdomen. Try suppository and Fleets enema today.   Principal Problem:   Bacteremia due to Staphylococcus Active Problems:   COPD (chronic obstructive pulmonary disease) with emphysema   Diabetes mellitus type 2, diet-controlled   Aortic stenosis   HTN (hypertension)   HLD (hyperlipidemia)   Elevated PSA   Heme positive stool   NSTEMI (non-ST  elevated myocardial infarction)   Acute renal failure   CAD (coronary artery disease)   Normocytic anemia   Diastolic CHF   Asbestosis   Atherosclerosis   Splenomegaly   Nephrolithiasis   DJD (degenerative joint  disease)    Signed, Ellora Varnum Martin[place] MD,FACC 08/25/2013 10:10 AM

## 2013-08-25 NOTE — Progress Notes (Signed)
Patient ID: Anthony Turner, male   DOB: August 12, 1940, 73 y.o.   MRN: [id number]         [facility] for Infectious Disease    Date of Admission:  08/27/2013           Day 4 vancomycin                Principal Problem:   Bacteremia due to Staphylococcus Active Problems:   Aortic stenosis   NSTEMI (non-ST elevated myocardial infarction)   COPD (chronic obstructive pulmonary disease) with emphysema   Diabetes mellitus type 2, diet-controlled   HTN (hypertension)   HLD (hyperlipidemia)   Elevated PSA   Heme positive stool   Acute renal failure   CAD (coronary artery disease)   Normocytic anemia   Diastolic CHF   Asbestosis   Atherosclerosis   Splenomegaly   Nephrolithiasis   DJD (degenerative joint disease)   . aspirin EC  81 mg Oral Daily  . budesonide-formoterol  2 puff Inhalation BID  . clopidogrel  75 mg Oral Q breakfast  . docusate sodium  100 mg Oral Daily  . furosemide  40 mg Oral Daily  . heparin subcutaneous  5,000 Units Subcutaneous 3 times per day  . insulin aspart  0-9 Units Subcutaneous TID WC  . levalbuterol  0.63 mg Nebulization Q6H  . metoprolol tartrate  25 mg Oral BID  . nystatin  5 mL Oral QID  . simvastatin  40 mg Oral q1800  . tiotropium  18 mcg Inhalation Daily  . vancomycin  750 mg Intravenous Q12H    Subjective: He has felt worse today due to some chest pain and wheezing.  Past Medical History  Diagnosis Date  . COPD (chronic obstructive pulmonary disease)   . Type 2 diabetes mellitus, controlled   . Asthma   . Arthritis     with DIP nodules  . History of diverticulitis of colon   . Seasonal allergic rhinitis   . Aortic stenosis     mod by last echo (04/2013) with EF 55%  . HTN (hypertension)   . HLD (hyperlipidemia)   . 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 Asbestos exposure     History  Substance Use Topics  . Smoking status: Former Smoker -- 4.00 packs/day for 35 years    Types:  Cigarettes    Quit date: 02/21/1982  . Smokeless tobacco: Never Used  . Alcohol Use: Yes     Comment: Occasional    Family History  Problem Relation Age of Onset  . Alcohol abuse Father     Allergies  Allergen Reactions  . Sulfa Antibiotics Nausea Only    Objective: Temp:  [96.7 F (35.9 C)-99.3 F (37.4 C)] 99.3 F (37.4 C) (02/17 0756) Pulse Rate:  [61-87] 64 (02/17 1453) BP: (94-141)/(44-94) 108/94 mmHg (02/17 1001) SpO2:  [91 %-98 %] 97 % (02/17 0843) Weight:  [99.4 kg (219 lb 2.2 oz)] 99.4 kg (219 lb 2.2 oz) (02/17 0500)  General: He is uncomfortable, sitting on the side of the bed Lungs: Diffuse expiratory wheezes Cor: Regular S1-S2 with a 2/6 systolic murmur at the right sternal border  Lab Results Lab Results  Component Value Date   WBC 15.5* 08/25/2013   HGB 10.2* 08/25/2013   HCT 30.3* 08/25/2013   MCV 84.9 08/25/2013   PLT 191 08/25/2013    Lab Results  Component Value Date   CREATININE 1.23 08/25/2013  BUN 29* 08/25/2013   NA 135* 08/25/2013   K 3.9 08/25/2013   CL 95* 08/25/2013   CO2 25 08/25/2013    Lab Results  Component Value Date   ALT 6 08/25/2013   AST 19 08/25/2013   ALKPHOS 119* 08/25/2013   BILITOT 0.4 08/25/2013      Microbiology: Recent Results (from the past 240 hour(s))  MRSA PCR SCREENING     Status: None   Collection Time    08/25/2013  4:12 PM      Result Value Ref Range Status   MRSA by PCR NEGATIVE  NEGATIVE Final   Comment:            The GeneXpert MRSA Assay (FDA     approved for NASAL specimens     only), is one component of a     comprehensive MRSA colonization     surveillance program. It is not     intended to diagnose MRSA     infection nor to guide or     monitor treatment for     MRSA infections.  CULTURE, BLOOD (ROUTINE X 2)     Status: None   Collection Time    08/27/2013  3:30 AM      Result Value Ref Range Status   Specimen Description BLOOD LEFT HAND   Final   Special Requests BOTTLES DRAWN AEROBIC AND  ANAEROBIC 10CC EACH   Final   Culture  Setup Time     Final   Value: 08/19/2013 08:25     Performed at [facility]   Culture     Final   Value: STAPHYLOCOCCUS SPECIES (COAGULASE NEGATIVE)     Note: RIFAMPIN AND GENTAMICIN SHOULD NOT BE USED AS SINGLE DRUGS FOR TREATMENT OF STAPH INFECTIONS.     Note: Gram Stain Report Called to,Read Back By and Verified With: CHRIS BUNGQUE ON 08/22/2013 AT 12:49A BY Dennard Nip     Performed at [facility]   Report Status 08/24/2013 FINAL   Final   Organism ID, Bacteria STAPHYLOCOCCUS SPECIES (COAGULASE NEGATIVE)   Final  CULTURE, BLOOD (ROUTINE X 2)     Status: None   Collection Time    08/25/2013  3:36 AM      Result Value Ref Range Status   Specimen Description BLOOD LEFT HAND   Final   Special Requests BOTTLES DRAWN AEROBIC AND ANAEROBIC 10CC EACH   Final   Culture  Setup Time     Final   Value: 08/31/2013 08:25     Performed at [facility]   Culture     Final   Value: STAPHYLOCOCCUS SPECIES (COAGULASE NEGATIVE)     Note: SUSCEPTIBILITIES PERFORMED ON PREVIOUS CULTURE WITHIN THE LAST 5 DAYS.     Note: Gram Stain Report Called to,Read Back By and Verified With: CHRIS BUNGQUE ON 08/22/2013 AT 12:49A BY Dennard Nip     Performed at [facility]   Report Status 08/24/2013 FINAL   Final  CULTURE, BLOOD (ROUTINE X 2)     Status: None   Collection Time    08/22/13  2:00 AM      Result Value Ref Range Status   Specimen Description BLOOD RIGHT ARM   Final   Special Requests BOTTLES DRAWN AEROBIC AND ANAEROBIC 10CC   Final   Culture  Setup Time     Final   Value: 08/22/2013 12:10     Performed at [facility]  Final   Value: STAPHYLOCOCCUS SPECIES (COAGULASE NEGATIVE)     Note: SUSCEPTIBILITIES PERFORMED ON PREVIOUS CULTURE WITHIN THE LAST 5 DAYS.     Note: Gram Stain Report Called to,Read Back By and Verified With: [place] Hodgin RN on 08/23/13 at 02:20 by Rise Mu     Performed at [facility]    Report Status 08/25/2013 FINAL   Final  CULTURE, BLOOD (ROUTINE X 2)     Status: None   Collection Time    08/22/13  2:15 AM      Result Value Ref Range Status   Specimen Description BLOOD LEFT HAND   Final   Special Requests     Final   Value: BOTTLES DRAWN AEROBIC AND ANAEROBIC 10CC AER,5CC ANA   Culture  Setup Time     Final   Value: 08/22/2013 12:10     Performed at [facility]   Culture     Final   Value: STAPHYLOCOCCUS SPECIES (COAGULASE NEGATIVE)     Note: RIFAMPIN AND GENTAMICIN SHOULD NOT BE USED AS SINGLE DRUGS FOR TREATMENT OF STAPH INFECTIONS.     Note: Gram Stain Report Called to,Read Back By and Verified With: Dellie Burns RN on 08/23/13 at 01:25 by Rise Mu     Performed at [facility]   Report Status 08/25/2013 FINAL   Final   Organism ID, Bacteria STAPHYLOCOCCUS SPECIES (COAGULASE NEGATIVE)   Final  CULTURE, BLOOD (ROUTINE X 2)     Status: None   Collection Time    08/24/13 10:05 AM      Result Value Ref Range Status   Specimen Description BLOOD RIGHT ARM   Final   Special Requests BOTTLES DRAWN AEROBIC ONLY 10CC   Final   Culture  Setup Time     Final   Value: 08/24/2013 13:56     Performed at [facility]   Culture     Final   Value:        BLOOD CULTURE RECEIVED NO GROWTH TO DATE CULTURE WILL BE HELD FOR 5 DAYS BEFORE ISSUING A FINAL NEGATIVE REPORT     Performed at [facility]   Report Status PENDING   Incomplete  CULTURE, BLOOD (ROUTINE X 2)     Status: None   Collection Time    08/24/13 12:30 PM      Result Value Ref Range Status   Specimen Description BLOOD LEFT HAND   Final   Special Requests BOTTLES DRAWN AEROBIC ONLY [facility]   Final   Culture  Setup Time     Final   Value: 08/24/2013 16:16     Performed at [facility]   Culture     Final   Value:        BLOOD CULTURE RECEIVED NO GROWTH TO DATE CULTURE WILL BE HELD FOR 5 DAYS BEFORE ISSUING A FINAL NEGATIVE REPORT     Performed at [facility]   Report  Status PENDING   Incomplete    Studies/Results: Dg Abd 1 View  08/25/2013   CLINICAL DATA:  Abdominal pain.  EXAM: ABDOMEN - 1 VIEW  COMPARISON:  CT ABD/PELV WO CM dated 08/11/2013  FINDINGS: The bowel gas pattern is normal. No soft tissue abnormalities are identified. No abnormal calcifications are seen by radiography. Degenerative changes of the spine are present.  IMPRESSION: Normal bowel gas pattern.   Electronically Signed   By: Aletta Edouard M.D.   On: 08/25/2013 10:51   Dg Chest  Port 1v Same Day  08/25/2013   CLINICAL DATA:  Shortness of breath and dyspnea  EXAM: PORTABLE CHEST - 1 VIEW SAME DAY  COMPARISON:  08/23/2013  FINDINGS: The heart size is mild to moderately enlarged. There is extensive asbestos related pleural disease bilaterally. Superimposed airspace opacities are identified within the right midlung and left base. There is also mild interstitial edema noted.  IMPRESSION: 1. Bilateral airspace opacities and mild interstitial edema is superimposed upon extensive bilateral asbestos related pleural disease.   Electronically Signed   By: Kerby Moors M.D.   On: 08/25/2013 10:50    Assessment: I will continue vancomycin for his methicillin resistant coagulase-negative staph bacteremia. He is at high risk for aortic valve endocarditis and will need at least 4 weeks of therapy. Repeat blood cultures are negative at 24 hours.  Plan: 1. Continue vancomycin 2. Await results of repeat blood cultures  3. Hold off on placing PICC until blood cultures are negative  Michel Bickers, MD [facility] [place] [phone] pager   [phone] cell 08/25/2013, 5:31 PM

## 2013-08-26 LAB — GLUCOSE, CAPILLARY
GLUCOSE-CAPILLARY: 134 mg/dL — AB (ref 70–99)
GLUCOSE-CAPILLARY: 151 mg/dL — AB (ref 70–99)
Glucose-Capillary: 126 mg/dL — ABNORMAL HIGH (ref 70–99)
Glucose-Capillary: 116 mg/dL — ABNORMAL HIGH (ref 70–99)
Glucose-Capillary: 121 mg/dL — ABNORMAL HIGH (ref 70–99)

## 2013-08-26 LAB — VANCOMYCIN, TROUGH: VANCOMYCIN TR: 18.1 ug/mL (ref 10.0–20.0)

## 2013-08-26 MED ORDER — VANCOMYCIN HCL IN DEXTROSE 750-5 MG/150ML-% IV SOLN
750.0000 mg | Freq: Two times a day (BID) | INTRAVENOUS | Status: DC
  Administered 2013-08-26 – 2013-08-28 (×5): 750 mg via INTRAVENOUS
  Filled 2013-08-26 (×6): qty 150

## 2013-08-26 MED ORDER — FUROSEMIDE 10 MG/ML IJ SOLN
40.0000 mg | Freq: Once | INTRAMUSCULAR | Status: AC
  Administered 2013-08-26: 40 mg via INTRAVENOUS

## 2013-08-26 MED ORDER — BISOPROLOL FUMARATE 10 MG PO TABS
10.0000 mg | ORAL_TABLET | Freq: Every day | ORAL | Status: DC
  Administered 2013-08-26: 10 mg via ORAL
  Filled 2013-08-26 (×2): qty 1

## 2013-08-26 MED ORDER — TAMSULOSIN HCL 0.4 MG PO CAPS
0.4000 mg | ORAL_CAPSULE | Freq: Every day | ORAL | Status: DC
  Administered 2013-08-26: 0.4 mg via ORAL
  Filled 2013-08-26 (×2): qty 1

## 2013-08-26 MED ORDER — PANTOPRAZOLE SODIUM 40 MG PO TBEC
40.0000 mg | DELAYED_RELEASE_TABLET | Freq: Every day | ORAL | Status: DC
  Administered 2013-08-26 – 2013-08-27 (×2): 40 mg via ORAL
  Filled 2013-08-26 (×2): qty 1

## 2013-08-26 MED ORDER — ISOSORBIDE MONONITRATE ER 60 MG PO TB24
60.0000 mg | ORAL_TABLET | Freq: Every day | ORAL | Status: DC
  Administered 2013-08-26 – 2013-08-27 (×2): 60 mg via ORAL
  Filled 2013-08-26 (×2): qty 1

## 2013-08-26 MED ORDER — FUROSEMIDE 10 MG/ML IJ SOLN
INTRAMUSCULAR | Status: AC
  Administered 2013-08-26: 40 mg via INTRAVENOUS
  Filled 2013-08-26: qty 4

## 2013-08-26 NOTE — Progress Notes (Signed)
TELEMETRY: Reviewed telemetry pt in NSR occ PVCs, rare couplets : Filed Vitals:   08/26/13 0130 08/26/13 0400 08/26/13 0500 08/26/13 0600  BP: 111/59   135/27  Pulse: 84 65  81  Temp:  98.4 F (36.9 C)    TempSrc:  Oral    Resp:      Height:      Weight:   220 lb 10.9 oz (100.1 kg)   SpO2: 96% 97%  97%    Intake/Output Summary (Last 24 hours) at 08/26/13 0729 Last data filed at 08/26/13 0600  Gross per 24 hour  Intake 1206.3 ml  Output   2208 ml  Net -1001.7 ml    SUBJECTIVE Restless and agitated during the night. More comfortable this am. Complains about foley catheter. Reports good BM yesterday. SOB better this am. No chest pain.  LABS: Basic Metabolic Panel:  Recent Labs  08/25/13 1042  NA 135*  K 3.9  CL 95*  CO2 25  GLUCOSE 142*  BUN 29*  CREATININE 1.23  CALCIUM 8.1*  CBC:  Recent Labs  08/25/13 1042  WBC 15.5*  NEUTROABS 13.1*  HGB 10.2*  HCT 30.3*  MCV 84.9  PLT 191   Troponin: <0.30  Calcium: 8.1 Albumin: 2.8 Alkaline phosphatase: 119. Other chemistries are normal.   Radiology/Studies:  Ct Abdomen Pelvis Wo Contrast  08/11/2013   CLINICAL DATA:  Left-sided flank pain and fever.  EXAM: CT ABDOMEN AND PELVIS WITHOUT CONTRAST  TECHNIQUE: Multidetector CT imaging of the abdomen and pelvis was performed following the standard protocol without intravenous contrast.  COMPARISON:  No priors.  FINDINGS: Lung Bases: Extensive calcified pleural plaques throughout the lung bases bilaterally, likely related to asbestos related pleural disease. Small left pleural effusion layering dependently. Assessment of the lung parenchyma in the lung bases is limited by considerable respiratory motion, however, there is likely some scarring and perhaps developing rounded atelectasis in the right lung base, particularly in the right lower lobe. Extensive aortic valve calcifications. Atherosclerotic calcifications are noted in the left anterior descending, left circumflex  and right coronary arteries. Small hiatal hernia.  Abdomen/Pelvis: Iodinated contrast material is noted in the collecting systems of the kidneys bilaterally, the ureters bilaterally and within the lumen of the urinary bladder, related to recent cath lab procedure. Mild bilateral perinephric stranding. Low attenuation throughout the hepatic parenchyma, compatible with hepatic steatosis. The unenhanced appearance of the gallbladder, pancreas and bilateral adrenal glands is unremarkable. Spleen is enlarged measuring 10.4 x 16.5 x 15.4 cm. Extensive atherosclerosis throughout the abdominal and pelvic vasculature, without evidence of aneurysm. Numerous colonic diverticulae are noted, without surrounding inflammatory changes to suggest an acute diverticulitis at this time. No significant volume of ascites. No pneumoperitoneum. No pathologic distention of small bowel. No definite lymphadenopathy identified within the abdomen or pelvis on today's non contrast CT examination.  Musculoskeletal: There are no aggressive appearing lytic or blastic lesions noted in the visualized portions of the skeleton.  IMPRESSION: 1. No acute findings in the abdomen or pelvis to account for the patient's symptoms. 2. Colonic diverticulosis without findings to suggest acute diverticulitis at this time. 3. Splenomegaly. 4. Asbestos related pleural disease. 5. Trace left pleural effusion. 6. Extensive atherosclerosis, including at least 3 vessel coronary artery disease. 7. There are calcifications of the aortic valve. Echocardiographic correlation for evaluation of potential valvular dysfunction may be warranted if clinically indicated. 8. Small hiatal hernia. 9. Hepatic steatosis. 10. Additional incidental findings, as above.   Electronically Signed   By:  Vinnie Langton M.D.   On: 08/25/2013 16:25    Ct Chest Wo Contrast  08/23/2013   CLINICAL DATA:  History of asbestos exposure. Evaluate for potential asbestosis.  EXAM: CT CHEST WITHOUT  CONTRAST  TECHNIQUE: Multidetector CT imaging of the chest was performed following the standard protocol without IV contrast.  COMPARISON:  CT of the abdomen and pelvis 08/27/2013.  FINDINGS: Mediastinum: Heart size is mildly enlarged. There is no significant pericardial fluid, thickening or pericardial calcification. Multiple borderline enlarged mediastinal lymph nodes. No definite pathologic mediastinal or hilar nodal enlargement. Please note that accurate exclusion of hilar adenopathy is limited on noncontrast CT scans. Small hiatal hernia with some circumferential thickening of the distal third of the esophagus. There is atherosclerosis of the thoracic aorta, the great vessels of the mediastinum and the coronary arteries, including calcified atherosclerotic plaque in the left main, left anterior descending, left circumflex and right coronary arteries. Severe calcifications of the aortic valve.  Lungs/Pleura: Extensive calcified pleural plaque is again noted throughout the thorax bilaterally, compatible with asbestos related pleural disease. Associated with this there are pleural-based areas of architectural distortion, some of which are slightly masslike, most notable in the right lower lobe and the left upper lobe, favored to reflect areas of developing rounded atelectasis. Several areas of ground-glass attenuation are seen scattered throughout the lungs bilaterally, which are nonspecific, largest of which is in the posterior aspect of the left upper lobe (image 16 of series 3 and image 76 of series [id number]) measuring approximately 2.3 x 1.4 x 3.1 cm. No definite areas of subpleural reticulation, traction bronchiectasis or frank honeycombing are identified at this time.  Upper Abdomen: Referred to contemporaneous CT of the abdomen and pelvis 08/19/2013 for full description of abdominal findings.  Musculoskeletal: There are no aggressive appearing lytic or blastic lesions noted in the visualized portions of the  skeleton.  IMPRESSION: 1. Severe asbestos related pleural disease with multifocal areas of probable developing rounded atelectasis, as detailed above. 2. No definite imaging findings at this time to strongly suggest asbestosis in the lungs, however, given the extent of multifocal ground-glass attenuation, early changes of asbestosis is not excluded, and followup high-resolution chest CT is recommended to assess for temporal changes in the appearance of the lung parenchyma. 3. Amongst the areas of ground-glass attenuation there is one that is slightly mass like in appearance. This is nonspecific, but measures approximately 3.1 x 1.4 x 2.3 cm in the posterior aspect of the left upper lobe. The possibility of an adenocarcinoma is not excluded, and repeat evaluation with high-resolution chest CT in 3 months is recommended at this time to confirm persistence and assess stability. This recommendation follows the consensus statement: Recommendations for the Management of Subsolid Pulmonary Nodules Detected at CT: A Statement from the Fleischner Society as published in Radiology 2013; 266:304-317. 4. Atherosclerosis, including left main and 3 vessel coronary artery disease. Assessment for potential risk factor modification, dietary therapy or pharmacologic therapy may be warranted, if clinically indicated. 5. There are calcifications of the aortic valve. Echocardiographic correlation for evaluation of potential valvular dysfunction may be warranted if clinically indicated. 6. Mild cardiomegaly.   Electronically Signed   By: Vinnie Langton M.D.   On: 08/23/2013 12:14  Ecg: 08/22/13 NSR with incomplete RBBB, Nonspecific ST-T changes.   CLINICAL DATA: Abdominal pain.  EXAM: ABDOMEN - 1 VIEW  COMPARISON: CT ABD/PELV WO CM dated 08/19/2013  FINDINGS: The bowel gas pattern is normal. No soft tissue abnormalities are identified. No  abnormal calcifications are seen by radiography. Degenerative changes of the spine are  present.  IMPRESSION: Normal bowel gas pattern.   Electronically Signed By: Aletta Edouard M.D. On: 08/25/2013 10:51  EXAM: PORTABLE CHEST - 1 VIEW SAME DAY  COMPARISON: 08/23/2013  FINDINGS: The heart size is mild to moderately enlarged. There is extensive asbestos related pleural disease bilaterally. Superimposed airspace opacities are identified within the right midlung and left base. There is also mild interstitial edema noted.  IMPRESSION: 1. Bilateral airspace opacities and mild interstitial edema is superimposed upon extensive bilateral asbestos related pleural disease.   Electronically Signed By: Kerby Moors M.D. On: 08/25/2013 10:50    Echo: Study Conclusions  - Left ventricle: The cavity size was normal. Wall thickness was normal. Systolic function was normal. The estimated ejection fraction was in the range of 55% to 60%. Although no diagnostic regional wall motion abnormality was identified, this possibility cannot be completely excluded on the basis of this study. Features are consistent with a pseudonormal left ventricular filling pattern, with concomitant abnormal relaxation and increased filling pressure (grade 2 diastolic dysfunction). - Aortic valve: There was moderate stenosis. Valve area: 1.28cm^2(VTI).   PHYSICAL EXAM General: Well developed, obese, appears uncomfortable.  Head: Normal, sclera non-icteric. Neck: Negative for carotid bruits. JVD not elevated. Lungs: Bilateral diffuse wheezes/crackles. Heart: RRR S1 S2 with 2/6 systolic murmur RUSB. Abdomen: Soft, obese, soft, normal BS, No hepatomegaly.  No obvious abdominal masses. Extremities: No clubbing, cyanosis or edema.  Distal pedal pulses are 2+ and equal bilaterally. Neuro: Alert and oriented X 3. Moves all extremities spontaneously.   ASSESSMENT AND PLAN: 1. Coagulase negative staph bacteremia. MRSA. Cultures from 08/24/13 are negative to date. Continue Vancomycin per ID.  Cannot place PICC line until bacteremia clears. Patient is at increased risk for endocarditis of the AV. Plan is to complete 6 week course of antibiotics. If unable to clear bacteremia may need to consider TEE but currently this will not alter therapy. 2. NSTEMI. 2 vessel obstructive CAD by cath with severe ostial LCx stenosis and moderate disease in the mid LAD.  He is not a  surgical candidate. The coronary lesions are suitable for PCI but need to wait until infection cleared. Ideally would wait and bring him back as an outpatient once infection treatment completed. If he becomes unstable from a cardiac standpoint would need to reevaluate. Currently on ASA, Plavix, metoprolol and IV Ntg. Ecg yesterday improved and troponin negative. Will DC IV Ntg and start Imdur 60 mg daily. Will change metoprolol to bisoprolol with his COPD. 3. Moderate aortic stenosis. 4. COPD/asbestosis.  Oxygenating well. CXT yesterday stable. He did receive IV lasix x 1 last night. On Symbicort, Spiriva, Xopenex nebs. Will add PPI.  5. Hyperlipidemia. On statin Rx. 6. Bladder obstruction. Now with foley in place. Will start Flomax today and try and DC foley tomorrow. If he fails bladder trial will need Urology input.  7. Acute renal insufficiency. Baseline creatinine 1.0. Now 1.8>>1.5>>1.45>>1.23.  8. DM type 2.  9. Constipation. Improved. Continue colace and miralax. 10. Hyponatremia.   Principal Problem:   Bacteremia due to Staphylococcus Active Problems:   COPD (chronic obstructive pulmonary disease) with emphysema   Diabetes mellitus type 2, diet-controlled   Aortic stenosis   HTN (hypertension)   HLD (hyperlipidemia)   Elevated PSA   Heme positive stool   NSTEMI (non-ST elevated myocardial infarction)   Acute renal failure   CAD (coronary artery disease)   Normocytic anemia   Diastolic CHF  Asbestosis   Atherosclerosis   Splenomegaly   Nephrolithiasis   DJD (degenerative joint disease)     Signed, Peter Martinique MD,FACC 08/26/2013 7:29 AM

## 2013-08-26 NOTE — Progress Notes (Signed)
Patient ID: Anthony Turner, male   DOB: 04-08-1941, 73 y.o.   MRN: [id number]         [facility]    Date of Admission:  08/14/2013           Day 5 vancomycin                Principal Problem:   Bacteremia due to Staphylococcus Active Problems:   Aortic stenosis   NSTEMI (non-ST elevated myocardial infarction)   COPD (chronic obstructive pulmonary disease) with emphysema   Diabetes mellitus type 2, diet-controlled   HTN (hypertension)   HLD (hyperlipidemia)   Elevated PSA   Heme positive stool   Acute renal failure   CAD (coronary artery disease)   Normocytic anemia   Diastolic CHF   Asbestosis   Atherosclerosis   Splenomegaly   Nephrolithiasis   DJD (degenerative joint disease)   . aspirin EC  81 mg Oral Daily  . bisoprolol  10 mg Oral Daily  . budesonide-formoterol  2 puff Inhalation BID  . clopidogrel  75 mg Oral Q breakfast  . docusate sodium  100 mg Oral Daily  . furosemide  40 mg Oral Daily  . heparin subcutaneous  5,000 Units Subcutaneous 3 times per day  . insulin aspart  0-9 Units Subcutaneous TID WC  . isosorbide mononitrate  60 mg Oral Daily  . levalbuterol  0.63 mg Nebulization Q6H  . nystatin  5 mL Oral QID  . pantoprazole  40 mg Oral Daily  . simvastatin  40 mg Oral q1800  . tamsulosin  0.4 mg Oral QPC supper  . tiotropium  18 mcg Inhalation Daily  . vancomycin  750 mg Intravenous Q12H    Subjective: He is sleepy this morning but is feeling better.  Past Medical History  Diagnosis Date  . COPD (chronic obstructive pulmonary disease)   . Type 2 diabetes mellitus, controlled   . Asthma   . Arthritis     with DIP nodules  . History of diverticulitis of colon   . Seasonal allergic rhinitis   . Aortic stenosis     mod by last echo (04/2013) with EF 55%  . HTN (hypertension)   . HLD (hyperlipidemia)   . 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 Asbestos exposure     History   Substance Use Topics  . Smoking status: Former Smoker -- 4.00 packs/day for 35 years    Types: Cigarettes    Quit date: 02/21/1982  . Smokeless tobacco: Never Used  . Alcohol Use: Yes     Comment: Occasional    Family History  Problem Relation Age of Onset  . Alcohol abuse Father     Allergies  Allergen Reactions  . Sulfa Antibiotics Nausea Only    Objective: Temp:  [97.8 F (36.6 C)-99 F (37.2 C)] 97.8 F (36.6 C) (02/18 0800) Pulse Rate:  [58-87] 82 (02/18 0909) Resp:  [20-22] 20 (02/18 0909) BP: (97-135)/(27-59) 97/53 mmHg (02/18 0909) SpO2:  [94 %-100 %] 95 % (02/18 0909) Weight:  [100.1 kg (220 lb 10.9 oz)] 100.1 kg (220 lb 10.9 oz) (02/18 0500)  General: He is sleeping quietly but arouses easily Lungs: Clear Cor: Regular S1-S2 with a 2/6 systolic murmur at the right sternal border  Lab Results Lab Results  Component Value Date   WBC 15.5* 08/25/2013   HGB 10.2* 08/25/2013   HCT 30.3* 08/25/2013   MCV  84.9 08/25/2013   PLT 191 08/25/2013    Lab Results  Component Value Date   CREATININE 1.23 08/25/2013   BUN 29* 08/25/2013   NA 135* 08/25/2013   K 3.9 08/25/2013   CL 95* 08/25/2013   CO2 25 08/25/2013    Lab Results  Component Value Date   ALT 6 08/25/2013   AST 19 08/25/2013   ALKPHOS 119* 08/25/2013   BILITOT 0.4 08/25/2013      Microbiology: Recent Results (from the past 240 hour(s))  MRSA PCR SCREENING     Status: None   Collection Time    08/24/2013  4:12 PM      Result Value Ref Range Status   MRSA by PCR NEGATIVE  NEGATIVE Final   Comment:            The GeneXpert MRSA Assay (FDA     approved for NASAL specimens     only), is one component of a     comprehensive MRSA colonization     surveillance program. It is not     intended to diagnose MRSA     infection nor to guide or     monitor treatment for     MRSA infections.  CULTURE, BLOOD (ROUTINE X 2)     Status: None   Collection Time    08/12/2013  3:30 AM      Result Value Ref Range Status    Specimen Description BLOOD LEFT HAND   Final   Special Requests BOTTLES DRAWN AEROBIC AND ANAEROBIC 10CC EACH   Final   Culture  Setup Time     Final   Value: 08/27/2013 08:25     Performed at [facility]   Culture     Final   Value: STAPHYLOCOCCUS SPECIES (COAGULASE NEGATIVE)     Note: RIFAMPIN AND GENTAMICIN SHOULD NOT BE USED AS SINGLE DRUGS FOR TREATMENT OF STAPH INFECTIONS.     Note: Gram Stain Report Called to,Read Back By and Verified With: CHRIS BUNGQUE ON 08/22/2013 AT 12:49A BY Dennard Nip     Performed at [facility]   Report Status 08/24/2013 FINAL   Final   Organism ID, Bacteria STAPHYLOCOCCUS SPECIES (COAGULASE NEGATIVE)   Final  CULTURE, BLOOD (ROUTINE X 2)     Status: None   Collection Time    08/11/2013  3:36 AM      Result Value Ref Range Status   Specimen Description BLOOD LEFT HAND   Final   Special Requests BOTTLES DRAWN AEROBIC AND ANAEROBIC 10CC EACH   Final   Culture  Setup Time     Final   Value: 08/19/2013 08:25     Performed at [facility]   Culture     Final   Value: STAPHYLOCOCCUS SPECIES (COAGULASE NEGATIVE)     Note: SUSCEPTIBILITIES PERFORMED ON PREVIOUS CULTURE WITHIN THE LAST 5 DAYS.     Note: Gram Stain Report Called to,Read Back By and Verified With: CHRIS BUNGQUE ON 08/22/2013 AT 12:49A BY Dennard Nip     Performed at [facility]   Report Status 08/24/2013 FINAL   Final  CULTURE, BLOOD (ROUTINE X 2)     Status: None   Collection Time    08/22/13  2:00 AM      Result Value Ref Range Status   Specimen Description BLOOD RIGHT ARM   Final   Special Requests BOTTLES DRAWN AEROBIC AND ANAEROBIC 10CC   Final   Culture  Setup Time  Final   Value: 08/22/2013 12:10     Performed at [facility]   Culture     Final   Value: STAPHYLOCOCCUS SPECIES (COAGULASE NEGATIVE)     Note: SUSCEPTIBILITIES PERFORMED ON PREVIOUS CULTURE WITHIN THE LAST 5 DAYS.     Note: Gram Stain Report Called to,Read Back By and Verified With:  [place] Hodgin RN on 08/23/13 at 02:20 by Rise Mu     Performed at [facility]   Report Status 08/25/2013 FINAL   Final  CULTURE, BLOOD (ROUTINE X 2)     Status: None   Collection Time    08/22/13  2:15 AM      Result Value Ref Range Status   Specimen Description BLOOD LEFT HAND   Final   Special Requests     Final   Value: BOTTLES DRAWN AEROBIC AND ANAEROBIC 10CC AER,5CC ANA   Culture  Setup Time     Final   Value: 08/22/2013 12:10     Performed at [facility]   Culture     Final   Value: STAPHYLOCOCCUS SPECIES (COAGULASE NEGATIVE)     Note: RIFAMPIN AND GENTAMICIN SHOULD NOT BE USED AS SINGLE DRUGS FOR TREATMENT OF STAPH INFECTIONS.     Note: Gram Stain Report Called to,Read Back By and Verified With: Dellie Burns RN on 08/23/13 at 01:25 by Rise Mu     Performed at [facility]   Report Status 08/25/2013 FINAL   Final   Organism ID, Bacteria STAPHYLOCOCCUS SPECIES (COAGULASE NEGATIVE)   Final  CULTURE, BLOOD (ROUTINE X 2)     Status: None   Collection Time    08/24/13 10:05 AM      Result Value Ref Range Status   Specimen Description BLOOD RIGHT ARM   Final   Special Requests BOTTLES DRAWN AEROBIC ONLY 10CC   Final   Culture  Setup Time     Final   Value: 08/24/2013 13:56     Performed at [facility]   Culture     Final   Value:        BLOOD CULTURE RECEIVED NO GROWTH TO DATE CULTURE WILL BE HELD FOR 5 DAYS BEFORE ISSUING A FINAL NEGATIVE REPORT     Performed at [facility]   Report Status PENDING   Incomplete  CULTURE, BLOOD (ROUTINE X 2)     Status: None   Collection Time    08/24/13 12:30 PM      Result Value Ref Range Status   Specimen Description BLOOD LEFT HAND   Final   Special Requests BOTTLES DRAWN AEROBIC ONLY [facility]   Final   Culture  Setup Time     Final   Value: 08/24/2013 16:16     Performed at [facility]   Culture     Final   Value:        BLOOD CULTURE RECEIVED NO GROWTH TO DATE CULTURE WILL BE HELD FOR  5 DAYS BEFORE ISSUING A FINAL NEGATIVE REPORT     Performed at [facility]   Report Status PENDING   Incomplete    Studies/Results: Dg Abd 1 View  08/25/2013   CLINICAL DATA:  Abdominal pain.  EXAM: ABDOMEN - 1 VIEW  COMPARISON:  CT ABD/PELV WO CM dated 08/22/2013  FINDINGS: The bowel gas pattern is normal. No soft tissue abnormalities are identified. No abnormal calcifications are seen by radiography. Degenerative changes of the spine are present.  IMPRESSION: Normal bowel  gas pattern.   Electronically Signed   By: Aletta Edouard M.D.   On: 08/25/2013 10:51   Dg Chest Port 1v Same Day  08/25/2013   CLINICAL DATA:  Shortness of breath and dyspnea  EXAM: PORTABLE CHEST - 1 VIEW SAME DAY  COMPARISON:  08/23/2013  FINDINGS: The heart size is mild to moderately enlarged. There is extensive asbestos related pleural disease bilaterally. Superimposed airspace opacities are identified within the right midlung and left base. There is also mild interstitial edema noted.  IMPRESSION: 1. Bilateral airspace opacities and mild interstitial edema is superimposed upon extensive bilateral asbestos related pleural disease.   Electronically Signed   By: Kerby Moors M.D.   On: 08/25/2013 10:50    Assessment: I will continue vancomycin for his methicillin resistant coagulase-negative staph bacteremia. He is at high risk for aortic valve endocarditis and will need at least 4 weeks of therapy. Repeat blood cultures are negative at 48 hours.  Plan: 1. Continue vancomycin 2. Await results of repeat blood cultures  3. We'll place a PICC tomorrow if repeat blood cultures remain negative  Michel Bickers, MD [facility] for Infectious [place] [phone] pager   [phone] cell 08/26/2013, 10:58 AM

## 2013-08-26 NOTE — Progress Notes (Signed)
ANTIBIOTIC CONSULT NOTE  Pharmacy Consult for Vancomycin  Indication: bacteremia  Allergies  Allergen Reactions  . Sulfa Antibiotics Nausea Only    Patient Measurements: Height: 5\' 6"  (167.6 cm) Weight: 220 lb 10.9 oz (100.1 kg) IBW/kg (Calculated) : 63.8  Vital Signs: Temp: 97.8 F (36.6 C) (02/18 0800) Temp src: Oral (02/18 0800) BP: 87/35 mmHg (02/18 1131) Pulse Rate: 60 (02/18 1131) Intake/Output from previous day: 02/17 0701 - 02/18 0700 In: 1220.8 [P.O.:580; I.V.:340.8; IV Piggyback:300] Out: 2208 [Urine:2205; Stool:3] Intake/Output from this shift: Total I/O In: 34.5 [I.V.:34.5] Out: 200 [Urine:200]  Labs:  Recent Labs  08/25/13 1042  WBC 15.5*  HGB 10.2*  PLT 191  CREATININE 1.23   Estimated Creatinine Clearance: 58.4 ml/min (by C-G formula based on Cr of 1.23).  Recent Labs  08/24/13 1220 08/26/13 1110  VANCOTROUGH 15.6 18.1     Microbiology: Recent Results (from the past 720 hour(s))  MRSA PCR SCREENING     Status: None   Collection Time    08/16/2013  4:12 PM      Result Value Ref Range Status   MRSA by PCR NEGATIVE  NEGATIVE Final   Comment:            The GeneXpert MRSA Assay (FDA     approved for NASAL specimens     only), is one component of a     comprehensive MRSA colonization     surveillance program. It is not     intended to diagnose MRSA     infection nor to guide or     monitor treatment for     MRSA infections.  CULTURE, BLOOD (ROUTINE X 2)     Status: None   Collection Time    08/24/2013  3:30 AM      Result Value Ref Range Status   Specimen Description BLOOD LEFT HAND   Final   Special Requests BOTTLES DRAWN AEROBIC AND ANAEROBIC 10CC EACH   Final   Culture  Setup Time     Final   Value: 08/19/2013 08:25     Performed at [facility]   Culture     Final   Value: STAPHYLOCOCCUS SPECIES (COAGULASE NEGATIVE)     Note: RIFAMPIN AND GENTAMICIN SHOULD NOT BE USED AS SINGLE DRUGS FOR TREATMENT OF STAPH INFECTIONS.   Note: Gram Stain Report Called to,Read Back By and Verified With: CHRIS BUNGQUE ON 08/22/2013 AT 12:49A BY Dennard Nip     Performed at [facility]   Report Status 08/24/2013 FINAL   Final   Organism ID, Bacteria STAPHYLOCOCCUS SPECIES (COAGULASE NEGATIVE)   Final  CULTURE, BLOOD (ROUTINE X 2)     Status: None   Collection Time    08/09/2013  3:36 AM      Result Value Ref Range Status   Specimen Description BLOOD LEFT HAND   Final   Special Requests BOTTLES DRAWN AEROBIC AND ANAEROBIC 10CC EACH   Final   Culture  Setup Time     Final   Value: 08/15/2013 08:25     Performed at [facility]   Culture     Final   Value: STAPHYLOCOCCUS SPECIES (COAGULASE NEGATIVE)     Note: SUSCEPTIBILITIES PERFORMED ON PREVIOUS CULTURE WITHIN THE LAST 5 DAYS.     Note: Gram Stain Report Called to,Read Back By and Verified With: CHRIS BUNGQUE ON 08/22/2013 AT 12:49A BY Dennard Nip     Performed at [facility]   Report Status 08/24/2013 FINAL  Final  CULTURE, BLOOD (ROUTINE X 2)     Status: None   Collection Time    08/22/13  2:00 AM      Result Value Ref Range Status   Specimen Description BLOOD RIGHT ARM   Final   Special Requests BOTTLES DRAWN AEROBIC AND ANAEROBIC 10CC   Final   Culture  Setup Time     Final   Value: 08/22/2013 12:10     Performed at [facility]   Culture     Final   Value: STAPHYLOCOCCUS SPECIES (COAGULASE NEGATIVE)     Note: SUSCEPTIBILITIES PERFORMED ON PREVIOUS CULTURE WITHIN THE LAST 5 DAYS.     Note: Gram Stain Report Called to,Read Back By and Verified With: [place] Hodgin RN on 08/23/13 at 02:20 by Rise Mu     Performed at [facility]   Report Status 08/25/2013 FINAL   Final  CULTURE, BLOOD (ROUTINE X 2)     Status: None   Collection Time    08/22/13  2:15 AM      Result Value Ref Range Status   Specimen Description BLOOD LEFT HAND   Final   Special Requests     Final   Value: BOTTLES DRAWN AEROBIC AND ANAEROBIC 10CC AER,5CC ANA   Culture   Setup Time     Final   Value: 08/22/2013 12:10     Performed at [facility]   Culture     Final   Value: STAPHYLOCOCCUS SPECIES (COAGULASE NEGATIVE)     Note: RIFAMPIN AND GENTAMICIN SHOULD NOT BE USED AS SINGLE DRUGS FOR TREATMENT OF STAPH INFECTIONS.     Note: Gram Stain Report Called to,Read Back By and Verified With: Dellie Burns RN on 08/23/13 at 01:25 by Rise Mu     Performed at [facility]   Report Status 08/25/2013 FINAL   Final   Organism ID, Bacteria STAPHYLOCOCCUS SPECIES (COAGULASE NEGATIVE)   Final  CULTURE, BLOOD (ROUTINE X 2)     Status: None   Collection Time    08/24/13 10:05 AM      Result Value Ref Range Status   Specimen Description BLOOD RIGHT ARM   Final   Special Requests BOTTLES DRAWN AEROBIC ONLY 10CC   Final   Culture  Setup Time     Final   Value: 08/24/2013 13:56     Performed at [facility]   Culture     Final   Value:        BLOOD CULTURE RECEIVED NO GROWTH TO DATE CULTURE WILL BE HELD FOR 5 DAYS BEFORE ISSUING A FINAL NEGATIVE REPORT     Performed at [facility]   Report Status PENDING   Incomplete  CULTURE, BLOOD (ROUTINE X 2)     Status: None   Collection Time    08/24/13 12:30 PM      Result Value Ref Range Status   Specimen Description BLOOD LEFT HAND   Final   Special Requests BOTTLES DRAWN AEROBIC ONLY [facility]   Final   Culture  Setup Time     Final   Value: 08/24/2013 16:16     Performed at [facility]   Culture     Final   Value:        BLOOD CULTURE RECEIVED NO GROWTH TO DATE CULTURE WILL BE HELD FOR 5 DAYS BEFORE ISSUING A FINAL NEGATIVE REPORT     Performed at [facility]   Report Status PENDING  Incomplete    Anti-infectives   Start     Dose/Rate Route Frequency Ordered Stop   08/26/13 1600  vancomycin (VANCOCIN) IVPB 750 mg/150 ml premix     750 mg 150 mL/hr over 60 Minutes Intravenous Every 12 hours 08/26/13 1458     08/22/13 1600  ceFAZolin (ANCEF) IVPB 2 g/50 mL premix   Status:  Discontinued     2 g 100 mL/hr over 30 Minutes Intravenous 3 times per day 08/22/13 1552 08/24/13 1457   08/22/13 1200  vancomycin (VANCOCIN) IVPB 750 mg/150 ml premix  Status:  Discontinued     750 mg 150 mL/hr over 60 Minutes Intravenous Every 12 hours 08/22/13 0125 08/26/13 1458   08/22/13 0130  vancomycin (VANCOCIN) IVPB 750 mg/150 ml premix     750 mg 150 mL/hr over 60 Minutes Intravenous  Once 08/22/13 0125 08/22/13 0315   08/15/2013 1000  levofloxacin (LEVAQUIN) tablet 500 mg  Status:  Discontinued     500 mg Oral Daily 08/12/2013 1627 08/22/13 1552      Assessment: 73 yo male admitted 2/12 for NSTEMI now with coagulase negative Staph bacteremia started on IV Abx 2/14. Patient currently afebrile, wbc now trending up to 15, scr continues to improve. CoNS is methicillin resistant. Vancomycin continues to be at goal, will continue current dosing.  Vancomycin trough 2/16= 15.6.  Vancomycin trough 2/18= 18.1 (slight accumulation from earlier in the week)  2/13 LVQ>>2/14 2/14 Vanc>> 2/14 Cefazolin>>2/16  2/16 BCx2>>ngtd 2/14 BCx2>>CoNS 2/13 BCx2>>CoNS-sen to vanc 2/12 MRSA PCR>>NEG  Goal of Therapy:  Vancomycin trough level 15-20 mcg/ml  Plan:  Continue vancomycin 750 mg IV Q12h Monitor CBC, Cx data Recheck vancomycin next week unless renal function changes   Erin Hearing PharmD., BCPS Clinical Pharmacist Pager [phone] 08/26/2013 3:00 PM

## 2013-08-26 NOTE — Progress Notes (Signed)
Pt becoming agitated and experiencing increased SOB. RT paged for breathing treatment. After treatment, patient still experiencing upper airway wheezing with audible crackles. Notified Dr. Claiborne Billings and new orders given.

## 2013-08-27 ENCOUNTER — Inpatient Hospital Stay (HOSPITAL_COMMUNITY): Payer: Medicare Other

## 2013-08-27 DIAGNOSIS — J9600 Acute respiratory failure, unspecified whether with hypoxia or hypercapnia: Secondary | ICD-10-CM

## 2013-08-27 DIAGNOSIS — J441 Chronic obstructive pulmonary disease with (acute) exacerbation: Secondary | ICD-10-CM

## 2013-08-27 LAB — POCT I-STAT 3, ART BLOOD GAS (G3+)
ACID-BASE EXCESS: 4 mmol/L — AB (ref 0.0–2.0)
Acid-Base Excess: 2 mmol/L (ref 0.0–2.0)
BICARBONATE: 29.9 meq/L — AB (ref 20.0–24.0)
BICARBONATE: 29.2 meq/L — AB (ref 20.0–24.0)
O2 Saturation: 94 %
O2 Saturation: 100 %
PH ART: 7.38 (ref 7.350–7.450)
PH ART: 7.319 — AB (ref 7.350–7.450)
Patient temperature: 97
TCO2: 31 mmol/L (ref 0–100)
TCO2: 31 mmol/L (ref 0–100)
pCO2 arterial: 50.3 mmHg — ABNORMAL HIGH (ref 35.0–45.0)
pCO2 arterial: 56.3 mmHg — ABNORMAL HIGH (ref 35.0–45.0)
pO2, Arterial: 73 mmHg — ABNORMAL LOW (ref 80.0–100.0)
pO2, Arterial: 291 mmHg — ABNORMAL HIGH (ref 80.0–100.0)

## 2013-08-27 LAB — CBC
HEMATOCRIT: 29.4 % — AB (ref 39.0–52.0)
Hemoglobin: 9.7 g/dL — ABNORMAL LOW (ref 13.0–17.0)
MCH: 28.4 pg (ref 26.0–34.0)
MCHC: 33 g/dL (ref 30.0–36.0)
MCV: 86 fL (ref 78.0–100.0)
Platelets: 248 10*3/uL (ref 150–400)
RBC: 3.42 MIL/uL — ABNORMAL LOW (ref 4.22–5.81)
RDW: 14.9 % (ref 11.5–15.5)
WBC: 16.6 10*3/uL — AB (ref 4.0–10.5)

## 2013-08-27 LAB — PRO B NATRIURETIC PEPTIDE: PRO B NATRI PEPTIDE: 2423 pg/mL — AB (ref 0–125)

## 2013-08-27 LAB — BASIC METABOLIC PANEL
BUN: 37 mg/dL — ABNORMAL HIGH (ref 6–23)
CHLORIDE: 93 meq/L — AB (ref 96–112)
CO2: 27 mEq/L (ref 19–32)
CREATININE: 1.48 mg/dL — AB (ref 0.50–1.35)
Calcium: 8 mg/dL — ABNORMAL LOW (ref 8.4–10.5)
GFR calc non Af Amer: 45 mL/min — ABNORMAL LOW (ref >90)
GFR, EST AFRICAN AMERICAN: 52 mL/min — AB (ref >90)
Glucose, Bld: 120 mg/dL — ABNORMAL HIGH (ref 70–99)
Potassium: 4.2 mEq/L (ref 3.7–5.3)
Sodium: 134 mEq/L — ABNORMAL LOW (ref 137–147)

## 2013-08-27 LAB — GLUCOSE, CAPILLARY
GLUCOSE-CAPILLARY: 141 mg/dL — AB (ref 70–99)
GLUCOSE-CAPILLARY: 114 mg/dL — AB (ref 70–99)
Glucose-Capillary: 187 mg/dL — ABNORMAL HIGH (ref 70–99)
Glucose-Capillary: 208 mg/dL — ABNORMAL HIGH (ref 70–99)
Glucose-Capillary: 220 mg/dL — ABNORMAL HIGH (ref 70–99)

## 2013-08-27 LAB — LACTIC ACID, PLASMA
Lactic Acid, Venous: 1.3 mmol/L (ref 0.5–2.2)
Lactic Acid, Venous: 1.2 mmol/L (ref 0.5–2.2)

## 2013-08-27 LAB — TROPONIN I
Troponin I: 0.81 ng/mL (ref <0.30)
Troponin I: 0.67 ng/mL (ref <0.30)

## 2013-08-27 LAB — PROCALCITONIN: Procalcitonin: 0.26 ng/mL

## 2013-08-27 LAB — PLATELET INHIBITION P2Y12: PLATELET FUNCTION P2Y12: 297 [PRU] (ref 194–418)

## 2013-08-27 MED ORDER — MIDAZOLAM HCL 2 MG/2ML IJ SOLN
INTRAMUSCULAR | Status: AC
  Administered 2013-08-27: 2 mg
  Filled 2013-08-27: qty 2

## 2013-08-27 MED ORDER — FUROSEMIDE 10 MG/ML IJ SOLN
40.0000 mg | Freq: Two times a day (BID) | INTRAMUSCULAR | Status: DC
  Administered 2013-08-27 – 2013-08-28 (×2): 40 mg via INTRAVENOUS
  Filled 2013-08-27 (×4): qty 4

## 2013-08-27 MED ORDER — SODIUM CHLORIDE 0.9 % IV BOLUS (SEPSIS)
500.0000 mL | INTRAVENOUS | Status: AC
  Administered 2013-08-27 (×2): 500 mL via INTRAVENOUS

## 2013-08-27 MED ORDER — CHLORHEXIDINE GLUCONATE 0.12 % MT SOLN
15.0000 mL | Freq: Two times a day (BID) | OROMUCOSAL | Status: DC
  Administered 2013-08-27 – 2013-08-31 (×9): 15 mL via OROMUCOSAL
  Filled 2013-08-27 (×9): qty 15

## 2013-08-27 MED ORDER — FENTANYL CITRATE 0.05 MG/ML IJ SOLN
25.0000 ug | INTRAMUSCULAR | Status: DC | PRN
  Administered 2013-08-27 – 2013-08-28 (×4): 100 ug via INTRAVENOUS
  Filled 2013-08-27 (×5): qty 2

## 2013-08-27 MED ORDER — METHYLPREDNISOLONE SODIUM SUCC 125 MG IJ SOLR
60.0000 mg | Freq: Three times a day (TID) | INTRAMUSCULAR | Status: DC
  Administered 2013-08-27 – 2013-08-31 (×13): 60 mg via INTRAVENOUS
  Filled 2013-08-27 (×3): qty 0.96
  Filled 2013-08-27: qty 2
  Filled 2013-08-27 (×5): qty 0.96
  Filled 2013-08-27: qty 2
  Filled 2013-08-27 (×3): qty 0.96
  Filled 2013-08-27: qty 2
  Filled 2013-08-27: qty 0.96

## 2013-08-27 MED ORDER — MORPHINE SULFATE 2 MG/ML IJ SOLN
INTRAMUSCULAR | Status: AC
  Administered 2013-08-27: 2 mg via INTRAVENOUS
  Filled 2013-08-27: qty 1

## 2013-08-27 MED ORDER — PROPOFOL 10 MG/ML IV EMUL
5.0000 ug/kg/min | INTRAVENOUS | Status: DC
  Administered 2013-08-27: 40 ug/kg/min via INTRAVENOUS
  Administered 2013-08-27: 30 ug/kg/min via INTRAVENOUS
  Administered 2013-08-27: 5 ug/kg/min via INTRAVENOUS
  Administered 2013-08-28 (×2): 40 ug/kg/min via INTRAVENOUS
  Filled 2013-08-27 (×5): qty 100

## 2013-08-27 MED ORDER — NITROGLYCERIN IN D5W 200-5 MCG/ML-% IV SOLN
2.0000 ug/min | INTRAVENOUS | Status: DC
  Administered 2013-08-27: 10 ug/min via INTRAVENOUS

## 2013-08-27 MED ORDER — FUROSEMIDE 10 MG/ML IJ SOLN
40.0000 mg | Freq: Once | INTRAMUSCULAR | Status: AC
  Administered 2013-08-27: 40 mg via INTRAVENOUS
  Filled 2013-08-27: qty 4

## 2013-08-27 MED ORDER — BIOTENE DRY MOUTH MT LIQD
15.0000 mL | Freq: Four times a day (QID) | OROMUCOSAL | Status: DC
  Administered 2013-08-28 – 2013-09-01 (×17): 15 mL via OROMUCOSAL

## 2013-08-27 MED ORDER — HEPARIN (PORCINE) IN NACL 100-0.45 UNIT/ML-% IJ SOLN
2250.0000 [IU]/h | INTRAMUSCULAR | Status: DC
  Administered 2013-08-27 (×2): 2400 [IU]/h via INTRAVENOUS
  Filled 2013-08-27 (×5): qty 250

## 2013-08-27 MED ORDER — BISOPROLOL FUMARATE 5 MG PO TABS
5.0000 mg | ORAL_TABLET | Freq: Every day | ORAL | Status: DC
  Filled 2013-08-27: qty 1

## 2013-08-27 MED ORDER — PROPOFOL 10 MG/ML IV EMUL
INTRAVENOUS | Status: AC
  Filled 2013-08-27: qty 100

## 2013-08-27 MED ORDER — NITROGLYCERIN IN D5W 200-5 MCG/ML-% IV SOLN
INTRAVENOUS | Status: AC
  Administered 2013-08-27: 50000 ug
  Filled 2013-08-27: qty 250

## 2013-08-27 MED ORDER — INSULIN ASPART 100 UNIT/ML ~~LOC~~ SOLN
0.0000 [IU] | SUBCUTANEOUS | Status: DC
  Administered 2013-08-27 – 2013-08-28 (×3): 3 [IU] via SUBCUTANEOUS
  Administered 2013-08-28: 5 [IU] via SUBCUTANEOUS

## 2013-08-27 MED ORDER — FENTANYL CITRATE 0.05 MG/ML IJ SOLN
INTRAMUSCULAR | Status: AC
  Administered 2013-08-27: 100 ug
  Filled 2013-08-27: qty 2

## 2013-08-27 MED ORDER — LIDOCAINE HCL (CARDIAC) 20 MG/ML IV SOLN
INTRAVENOUS | Status: AC
  Filled 2013-08-27: qty 5

## 2013-08-27 MED ORDER — NOREPINEPHRINE BITARTRATE 1 MG/ML IJ SOLN
2.0000 ug/min | INTRAVENOUS | Status: DC
  Administered 2013-08-27: 26 ug/min via INTRAVENOUS
  Administered 2013-08-27 – 2013-08-29 (×2): 20 ug/min via INTRAVENOUS
  Administered 2013-08-30: 12 ug/min via INTRAVENOUS
  Administered 2013-08-30: 16 ug/min via INTRAVENOUS
  Administered 2013-09-01: 25 ug/min via INTRAVENOUS
  Filled 2013-08-27 (×8): qty 16

## 2013-08-27 MED ORDER — ROCURONIUM BROMIDE 50 MG/5ML IV SOLN
INTRAVENOUS | Status: AC
  Filled 2013-08-27: qty 2

## 2013-08-27 MED ORDER — MORPHINE SULFATE 2 MG/ML IJ SOLN
2.0000 mg | INTRAMUSCULAR | Status: DC | PRN
  Administered 2013-08-27: 2 mg via INTRAVENOUS

## 2013-08-27 MED ORDER — ETOMIDATE 2 MG/ML IV SOLN
20.0000 mg | Freq: Once | INTRAVENOUS | Status: AC
  Administered 2013-08-27: 20 mg via INTRAVENOUS

## 2013-08-27 MED ORDER — SUCCINYLCHOLINE CHLORIDE 20 MG/ML IJ SOLN
INTRAMUSCULAR | Status: AC
  Filled 2013-08-27: qty 1

## 2013-08-27 MED ORDER — INSULIN ASPART 100 UNIT/ML ~~LOC~~ SOLN
0.0000 [IU] | SUBCUTANEOUS | Status: DC
  Administered 2013-08-27: 3 [IU] via SUBCUTANEOUS

## 2013-08-27 MED ORDER — POLYETHYLENE GLYCOL 3350 17 G PO PACK
17.0000 g | PACK | Freq: Every day | ORAL | Status: DC
  Administered 2013-08-30 – 2013-08-31 (×2): 17 g via ORAL
  Filled 2013-08-27 (×6): qty 1

## 2013-08-27 MED ORDER — ETOMIDATE 2 MG/ML IV SOLN
INTRAVENOUS | Status: AC
  Filled 2013-08-27: qty 20

## 2013-08-27 NOTE — Progress Notes (Signed)
Patient ID: Anthony Turner, male   DOB: 1940/08/30, 73 y.o.   MRN: [id number]         [facility]    Date of Admission:  08/24/2013           Day 6 vancomycin                Principal Problem:   Bacteremia due to Staphylococcus Active Problems:   Aortic stenosis   NSTEMI (non-ST elevated myocardial infarction)   COPD (chronic obstructive pulmonary disease) with emphysema   Diabetes mellitus type 2, diet-controlled   HTN (hypertension)   HLD (hyperlipidemia)   Elevated PSA   Heme positive stool   Acute renal failure   CAD (coronary artery disease)   Normocytic anemia   Diastolic CHF   Asbestosis   Atherosclerosis   Splenomegaly   Nephrolithiasis   DJD (degenerative joint disease)   Acute respiratory failure   COPD with exacerbation   . aspirin EC  81 mg Oral Daily  . budesonide-formoterol  2 puff Inhalation BID  . clopidogrel  75 mg Oral Q breakfast  . docusate sodium  100 mg Oral Daily  . etomidate      . furosemide  40 mg Intravenous BID  . insulin aspart  0-9 Units Subcutaneous TID WC  . levalbuterol  0.63 mg Nebulization Q6H  . lidocaine (cardiac) 100 mg/78ml      . methylPREDNISolone (SOLU-MEDROL) injection  60 mg Intravenous 3 times per day  . nystatin  5 mL Oral QID  . pantoprazole  40 mg Oral Daily  . polyethylene glycol  17 g Oral Daily  . propofol      . rocuronium      . simvastatin  40 mg Oral q1800  . succinylcholine      . tiotropium  18 mcg Inhalation Daily  . vancomycin  750 mg Intravenous Q12H   Objective: Temp:  [97 F (36.1 C)-97.9 F (36.6 C)] 97 F (36.1 C) (02/19 1200) Pulse Rate:  [46-81] 60 (02/19 1400) Resp:  [0-22] 18 (02/19 1345) BP: (63-145)/(23-90) 127/29 mmHg (02/19 1400) SpO2:  [93 %-100 %] 99 % (02/19 1400) FiO2 (%):  [60 %-100 %] 60 % (02/19 1430)  General: He was elevated this morning for increasing respiratory distress Lungs: Few rhonchi. No wheezes. Scant bloody sputum being suctioned from  the endotracheal tube Cor: Regular S1-S2 with a 2/6 systolic murmur at the right sternal border  Lab Results Lab Results  Component Value Date   WBC 16.6* 08/27/2013   HGB 9.7* 08/27/2013   HCT 29.4* 08/27/2013   MCV 86.0 08/27/2013   PLT 248 08/27/2013    Lab Results  Component Value Date   CREATININE 1.48* 08/27/2013   BUN 37* 08/27/2013   NA 134* 08/27/2013   K 4.2 08/27/2013   CL 93* 08/27/2013   CO2 27 08/27/2013    Lab Results  Component Value Date   ALT 6 08/25/2013   AST 19 08/25/2013   ALKPHOS 119* 08/25/2013   BILITOT 0.4 08/25/2013      Microbiology: Recent Results (from the past 240 hour(s))  MRSA PCR SCREENING     Status: None   Collection Time    08/25/2013  4:12 PM      Result Value Ref Range Status   MRSA by PCR NEGATIVE  NEGATIVE Final   Comment:            The GeneXpert MRSA Assay (FDA  approved for NASAL specimens     only), is one component of a     comprehensive MRSA colonization     surveillance program. It is not     intended to diagnose MRSA     infection nor to guide or     monitor treatment for     MRSA infections.  CULTURE, BLOOD (ROUTINE X 2)     Status: None   Collection Time    08/09/2013  3:30 AM      Result Value Ref Range Status   Specimen Description BLOOD LEFT HAND   Final   Special Requests BOTTLES DRAWN AEROBIC AND ANAEROBIC 10CC EACH   Final   Culture  Setup Time     Final   Value: 08/22/2013 08:25     Performed at [facility]   Culture     Final   Value: STAPHYLOCOCCUS SPECIES (COAGULASE NEGATIVE)     Note: RIFAMPIN AND GENTAMICIN SHOULD NOT BE USED AS SINGLE DRUGS FOR TREATMENT OF STAPH INFECTIONS.     Note: Gram Stain Report Called to,Read Back By and Verified With: CHRIS BUNGQUE ON 08/22/2013 AT 12:49A BY Dennard Nip     Performed at [facility]   Report Status 08/24/2013 FINAL   Final   Organism ID, Bacteria STAPHYLOCOCCUS SPECIES (COAGULASE NEGATIVE)   Final  CULTURE, BLOOD (ROUTINE X 2)     Status: None    Collection Time    08/25/2013  3:36 AM      Result Value Ref Range Status   Specimen Description BLOOD LEFT HAND   Final   Special Requests BOTTLES DRAWN AEROBIC AND ANAEROBIC 10CC EACH   Final   Culture  Setup Time     Final   Value: 08/27/2013 08:25     Performed at [facility]   Culture     Final   Value: STAPHYLOCOCCUS SPECIES (COAGULASE NEGATIVE)     Note: SUSCEPTIBILITIES PERFORMED ON PREVIOUS CULTURE WITHIN THE LAST 5 DAYS.     Note: Gram Stain Report Called to,Read Back By and Verified With: CHRIS BUNGQUE ON 08/22/2013 AT 12:49A BY Dennard Nip     Performed at [facility]   Report Status 08/24/2013 FINAL   Final  CULTURE, BLOOD (ROUTINE X 2)     Status: None   Collection Time    08/22/13  2:00 AM      Result Value Ref Range Status   Specimen Description BLOOD RIGHT ARM   Final   Special Requests BOTTLES DRAWN AEROBIC AND ANAEROBIC 10CC   Final   Culture  Setup Time     Final   Value: 08/22/2013 12:10     Performed at [facility]   Culture     Final   Value: STAPHYLOCOCCUS SPECIES (COAGULASE NEGATIVE)     Note: SUSCEPTIBILITIES PERFORMED ON PREVIOUS CULTURE WITHIN THE LAST 5 DAYS.     Note: Gram Stain Report Called to,Read Back By and Verified With: [place] Hodgin RN on 08/23/13 at 02:20 by Rise Mu     Performed at [facility]   Report Status 08/25/2013 FINAL   Final  CULTURE, BLOOD (ROUTINE X 2)     Status: None   Collection Time    08/22/13  2:15 AM      Result Value Ref Range Status   Specimen Description BLOOD LEFT HAND   Final   Special Requests     Final   Value: BOTTLES DRAWN AEROBIC AND ANAEROBIC 10CC AER,5CC  ANA   Culture  Setup Time     Final   Value: 08/22/2013 12:10     Performed at [facility]   Culture     Final   Value: STAPHYLOCOCCUS SPECIES (COAGULASE NEGATIVE)     Note: RIFAMPIN AND GENTAMICIN SHOULD NOT BE USED AS SINGLE DRUGS FOR TREATMENT OF STAPH INFECTIONS.     Note: Gram Stain Report Called to,Read Back By  and Verified With: Dellie Burns RN on 08/23/13 at 01:25 by Rise Mu     Performed at [facility]   Report Status 08/25/2013 FINAL   Final   Organism ID, Bacteria STAPHYLOCOCCUS SPECIES (COAGULASE NEGATIVE)   Final  CULTURE, BLOOD (ROUTINE X 2)     Status: None   Collection Time    08/24/13 10:05 AM      Result Value Ref Range Status   Specimen Description BLOOD RIGHT ARM   Final   Special Requests BOTTLES DRAWN AEROBIC ONLY 10CC   Final   Culture  Setup Time     Final   Value: 08/24/2013 13:56     Performed at [facility]   Culture     Final   Value:        BLOOD CULTURE RECEIVED NO GROWTH TO DATE CULTURE WILL BE HELD FOR 5 DAYS BEFORE ISSUING A FINAL NEGATIVE REPORT     Performed at [facility]   Report Status PENDING   Incomplete  CULTURE, BLOOD (ROUTINE X 2)     Status: None   Collection Time    08/24/13 12:30 PM      Result Value Ref Range Status   Specimen Description BLOOD LEFT HAND   Final   Special Requests BOTTLES DRAWN AEROBIC ONLY [facility]   Final   Culture  Setup Time     Final   Value: 08/24/2013 16:16     Performed at [facility]   Culture     Final   Value:        BLOOD CULTURE RECEIVED NO GROWTH TO DATE CULTURE WILL BE HELD FOR 5 DAYS BEFORE ISSUING A FINAL NEGATIVE REPORT     Performed at [facility]   Report Status PENDING   Incomplete    Studies/Results: Dg Chest Port 1 View  08/27/2013   CLINICAL DATA:  Status post left central line placement  EXAM: PORTABLE CHEST - 1 VIEW  COMPARISON:  08/27/2013 804 hrs  FINDINGS: Cardiac shadow remains enlarged. An endotracheal tube is now seen with the tip 5.6 cm above the carina. A new left central venous line is noted with the tip in the proximal superior vena cava. No definitive pneumothorax is seen. A nasogastric catheter extends into the stomach beneath the edge of the film. Diffuse pleural calcifications are again seen bilaterally. Patchy interstitial changes are again identified  stable from the prior exam. The linear density is noted overlying the mid left cardiac border stable from the prior exam. This is likely related to the pleural calcifications. No other focal abnormality is seen.  IMPRESSION: Chronic changes in the lungs bilaterally.  Endotracheal tube, nasogastric catheter and left central venous line in satisfactory position. No pneumothorax is noted.   Electronically Signed   By: Inez Catalina M.D.   On: 08/27/2013 11:23   Dg Chest Port 1v Same Day  08/27/2013   CLINICAL DATA:  Shortness of Breath  EXAM: PORTABLE CHEST - 1 VIEW SAME DAY  COMPARISON:  August 23, 2013 chest CT  and August 25, 2013 chest radiograph  FINDINGS: There is extensive pleural and diaphragmatic calcification consistent with prior asbestos exposure. There is diffuse interstitial disease bilaterally which may be due to to acute interstitial edema superimposed on chronic interstitial disease, likely due to asbestosis. There is also underlying emphysematous change.  Heart is enlarged. The pulmonary vascularity reflects underlying emphysema.  IMPRESSION: The overall appearance is felt to be secondary to congestive heart failure superimposed on emphysema and underlying asbestos exposure/ asbestosis. The appearance is stable compared to 2 days prior.   Electronically Signed   By: Lowella Grip M.D.   On: 08/27/2013 08:13    Assessment: I will continue vancomycin for his methicillin resistant coagulase-negative staph bacteremia. He is at high risk for aortic valve endocarditis and will need at least 4 weeks of therapy. Repeat blood cultures were negative at 3 days.  Plan: 1. Continue vancomycin  Michel Bickers, MD [facility] [place] [facility] [phone] pager   [phone] cell 08/27/2013, 3:09 PM

## 2013-08-27 NOTE — Progress Notes (Addendum)
Nursing Note: Advised Cardiology On-Call of increasing agitation, report of pain, and patient moaning and yelling with discomfort.  No increased oxygen demands however patient has expiratory wheezes and crackles.  VS being obtained more frequently than step-down orders due to cardiac instability.  SL NTG given as BP allows for chest pain.  Patient being held NPO at this time.

## 2013-08-27 NOTE — Progress Notes (Signed)
[facility] Progress Note Patient Name: Dornell Grasmick DOB: [birth year] MRN: [id number]  Date of Service  08/27/2013   HPI/Events of Note   Hyperglycemia, no longer eating RN question lasix or not,   eICU Interventions  Change insulin/cbg to q4 Cont lasix per Dr. Doug Sou note today   Intervention Category Intermediate Interventions: Hyperglycemia - evaluation and treatment  MCQUAID, DOUGLAS 08/27/2013, 9:13 PM

## 2013-08-27 NOTE — Progress Notes (Signed)
2nd CE +, text sent to on call PA.

## 2013-08-27 NOTE — Progress Notes (Signed)
Complained of chest , bp 94/64pain on the left side which , he cannot give scale , became diaphoretic, vicodin 2 tabs , imdur given tolerated well. Still wheezing and short of breath, stat xopenex given which he claimed made it worst. MD made aware with order. Started with nitroglycerin gtt at 20 mcg, morphine 2 mg given, still moaning on and,  Off. Seen by pccm. Transferred to ccu 2H14. Report given.

## 2013-08-27 NOTE — Progress Notes (Signed)
Nursing Note: Patient with two episodes of agitation and restlessness associated with Chest Pain/Generalized Pain 2032 and 0356.  Both episodes were treated with sublingual NTG with no effect.  There were no EKG changes noted.  Patient during these times reported shortness of breath.  Supplemental O 2 provided at 3 Liters via Nasal Cannula - breath sounds were tight with expiratory wheezes throughout with RT giving breathing treatments as needed.  Patient did respond well to Vicodin and PO Ativan for agitation during these episodes.  Patient ambulated to chair from bed at 0356 with RN assistance x 2 to help with posturing and shortness of breath.  Family at bedside remains concerned for existing blockages and plan for PCI later.

## 2013-08-27 NOTE — Consult Note (Signed)
PULMONARY / CRITICAL CARE MEDICINE  Name: Anthony Turner MRN: [id number] DOB: [birth year]    ADMISSION DATE:  08/26/2013 CONSULTATION DATE:  08/27/2013  REFERRING MD :  Cardiology PRIMARY SERVICE:  Cardiology  CHIEF COMPLAINT: Acute respiratory failure  BRIEF PATIENT DESCRIPTION:  73 yo with remote tobacco use (quit 25 years ago), COPD / asthma?, asbestos exposure, sob for "years" who was treated with Levaquin for 3 days prior to admit for URI admitted 2/12 with dyspnea / chest pain.  Cardiac cath 2/13 revealed moderate to severe AS, significant CAD, moderate pulmonary hypertension and diastolic heart failure.  Developed respiratory distress in 2/19, PCCM was consulted and patient was intubated.  SIGNIFICANT EVENTS / STUDIES:   2/12  Admitted with dyspnea and chest pain 2/13  TTE >>> EF 40-98%, grade 2 diastolic dysfunction 1/19  Cardiac cath >>> moderate to severe AS, significant CAD, moderate pulmonary hypertension and diastolic heart failure 1/47  Chest CT >>> Asbestos telated pleural disease, rounded atelectasis, no evidence of lung asbestosis, posterior upper lobe GGO concerning for BAC  LINES / TUBES: OETT 2/19 >>> OGT 2/19 >>> L IJ CVL 2/19 >>> Foley ??? >>>  CULTURES: 2/12  MRSA >>> neg 2/14  Blood >>> STAPHYLOCOCCUS SPECIES (COAGULASE NEGATIVE) 2/16  Blood >>>  ANTIBIOTICS: Vancomycin  2/18 >>>  The patient is encephalopathic and unable to provide history, which was obtained for available medical records.  HISTORY OF PRESENT ILLNESS:  73 yo with remote tobacco use (quit 25 years ago), COPD / asthma?, asbestos exposure, sob for "years" who was treated with Levaquin for 3 days prior to admit for URI admitted 2/12 with dyspnea / chest pain.  Cardiac cath 2/13 revealed moderate to severe AS, significant CAD, moderate pulmonary hypertension and diastolic heart failure.  Developed respiratory distress in 2/19, PCCM was consulted and patient was intubated.  PAST MEDICAL HISTORY  :  Past Medical History  Diagnosis Date  . COPD (chronic obstructive pulmonary disease)   . Type 2 diabetes mellitus, controlled   . Asthma   . Arthritis     with DIP nodules  . History of diverticulitis of colon   . Seasonal allergic rhinitis   . Aortic stenosis     mod by last echo (04/2013) with EF 55%  . HTN (hypertension)   . HLD (hyperlipidemia)   . 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 Asbestos exposure    Past Surgical History  Procedure Laterality Date  . Appendectomy  ? 1956  . Tonsillectomy  ? 1944  . Cataract extraction Bilateral 2012   Prior to Admission medications   Medication Sig Start Date End Date Taking? Authorizing Provider  acetaminophen (TYLENOL) 500 MG tablet Take 1,000 mg by mouth every 6 (six) hours as needed for mild pain.   Yes Historical Provider, MD  albuterol (PROVENTIL HFA;VENTOLIN HFA) 108 (90 BASE) MCG/ACT inhaler Inhale 2 puffs into the lungs 3 (three) times daily.   Yes Historical Provider, MD  amLODipine (NORVASC) 5 MG tablet Take 2.5 mg by mouth daily.    Yes Historical Provider, MD  aspirin 81 MG tablet Take 81 mg by mouth daily.   Yes Historical Provider, MD  budesonide-formoterol (SYMBICORT) 160-4.5 MCG/ACT inhaler Inhale 2 puffs into the lungs 2 (two) times daily. 06/30/13  Yes Ria Bush, MD  Docusate Calcium (STOOL SOFTENER PO) Take 1 capsule by mouth 2 (two) times daily.    Yes Historical Provider, MD  furosemide (LASIX) 20  MG tablet Take 20-60 mg by mouth daily as needed for fluid.   Yes Historical Provider, MD  hydrochlorothiazide (HYDRODIURIL) 25 MG tablet Take 25 mg by mouth daily.   Yes Historical Provider, MD  ibuprofen (ADVIL,MOTRIN) 200 MG tablet Take 400 mg by mouth every 6 (six) hours as needed.   Yes Historical Provider, MD  levofloxacin (LEVAQUIN) 500 MG tablet Take 1 tablet (500 mg total) by mouth daily. 08/18/13  Yes Webb Silversmith, NP  losartan (COZAAR) 100 MG tablet Take 100 mg by  mouth daily.   Yes Historical Provider, MD  NAPROXEN PO Take 2 tablets by mouth every 6 (six) hours as needed (fever/ pain).   Yes Historical Provider, MD  potassium chloride SA (K-DUR,KLOR-CON) 20 MEQ tablet Take 20 mEq by mouth daily.   Yes Historical Provider, MD  pravastatin (PRAVACHOL) 80 MG tablet Take 80 mg by mouth daily.   Yes Historical Provider, MD  tiotropium (SPIRIVA) 18 MCG inhalation capsule Place 18 mcg into inhaler and inhale daily.   Yes Historical Provider, MD  vitamin B-12 (CYANOCOBALAMIN) 1000 MCG tablet Take 1,000 mcg by mouth daily.   Yes Historical Provider, MD  nystatin (MYCOSTATIN) 100000 UNIT/ML suspension Take 5 mLs (500,000 Units total) by mouth 4 (four) times daily. 09/04/2013   Webb Silversmith, NP   Allergies  Allergen Reactions  . Sulfa Antibiotics Nausea Only   FAMILY HISTORY:  Family History  Problem Relation Age of Onset  . Alcohol abuse Father    SOCIAL HISTORY:  reports that he quit smoking about 31 years ago. His smoking use included Cigarettes. He has a 140 pack-year smoking history. He has never used smokeless tobacco. He reports that he drinks alcohol. He reports that he does not use illicit drugs.  REVIEW OF SYSTEMS:  Unable to provide.  INTERVAL HISTORY:  VITAL SIGNS: Temp:  [97.2 F (36.2 C)-97.9 F (36.6 C)] 97.4 F (36.3 C) (02/19 0811) Pulse Rate:  [52-83] 58 (02/19 0811) Resp:  [18-22] 22 (02/19 0811) BP: (77-114)/(28-90) 110/73 mmHg (02/19 0811) SpO2:  [94 %-100 %] 96 % (02/19 0811)  HEMODYNAMICS:   VENTILATOR SETTINGS:   INTAKE / OUTPUT: Intake/Output     02/18 0701 - 02/19 0700 02/19 0701 - 02/20 0700   P.O. 840    I.V. (mL/kg) 66.8 (0.7)    IV Piggyback 300    Total Intake(mL/kg) 1206.8 (12.1)    Urine (mL/kg/hr) 1050 (0.4)    Stool     Total Output 1050     Net +156.8            PHYSICAL EXAMINATION: General:  Appears acutely ill, confused, sob @rest  Neuro:  Dull affect, mild confusion HEENT:  PERRL, No  LAN/JVD Cardiovascular:  RRR,  Lungs:  Decreased air movement, decreased bs bases Abdomen:  Soft, nontender, bowel sounds diminished Musculoskeletal:  Moves all extremities, ++lower ext  edema Skin:  Intact, warm  LABS: CBC  Recent Labs Lab 08/22/13 0300 08/23/13 0300 08/25/13 1042  WBC 13.8* 11.7* 15.5*  HGB 10.0* 9.8* 10.2*  HCT 30.1* 29.1* 30.3*  PLT 137* 141* 191   Coag's  Recent Labs Lab 08/24/2013 1351  APTT 26  INR 1.10   BMET  Recent Labs Lab 08/25/2013 0330 08/23/13 0300 08/25/13 1042  NA 137 136* 135*  K 3.3* 4.0 3.9  CL 97 98 95*  CO2 23 24 25   BUN 31* 34* 29*  CREATININE 1.51* 1.45* 1.23  GLUCOSE 178* 162* 142*   Electrolytes  Recent Labs Lab  08/29/2013 1146 08/23/2013 1900 08/24/2013 0330 08/25/2013 0915 08/23/13 0300 08/25/13 1042  CALCIUM 8.8  --  8.3*  --  7.7* 8.1*  MG  --  2.2  --  2.2  --   --    Sepsis Markers No results found for this basename: LATICACIDVEN, PROCALCITON, O2SATVEN,  in the last 168 hours  ABG  Recent Labs Lab 08/11/2013 1237  PHART 7.396  PCO2ART 42.9  PO2ART 102.0*   Liver Enzymes  Recent Labs Lab 08/25/13 1042  AST 19  ALT 6  ALKPHOS 119*  BILITOT 0.4  ALBUMIN 2.8*   Cardiac Enzymes  Recent Labs Lab 08/31/2013 1146  08/15/2013 2130 08/27/2013 0330 08/25/13 1042  TROPONINI  --   < > 0.63* 0.37* <0.30  PROBNP 679.3*  --   --   --   --   < > = values in this interval not displayed. Glucose  Recent Labs Lab 08/25/13 1719 08/25/13 2139 08/26/13 0828 08/26/13 1306 08/26/13 1730 08/26/13 2108  GLUCAP 126* 149* 116* 121* 134* 151*   Dg Abd 1 View  08/25/2013   CLINICAL DATA:  Abdominal pain.  EXAM: ABDOMEN - 1 VIEW  COMPARISON:  CT ABD/PELV WO CM dated 08/13/2013  FINDINGS: The bowel gas pattern is normal. No soft tissue abnormalities are identified. No abnormal calcifications are seen by radiography. Degenerative changes of the spine are present.  IMPRESSION: Normal bowel gas pattern.   Electronically  Signed   By: Aletta Edouard M.D.   On: 08/25/2013 10:51   Dg Chest Port 1v Same Day  08/27/2013   CLINICAL DATA:  Shortness of Breath  EXAM: PORTABLE CHEST - 1 VIEW SAME DAY  COMPARISON:  August 23, 2013 chest CT and August 25, 2013 chest radiograph  FINDINGS: There is extensive pleural and diaphragmatic calcification consistent with prior asbestos exposure. There is diffuse interstitial disease bilaterally which may be due to to acute interstitial edema superimposed on chronic interstitial disease, likely due to asbestosis. There is also underlying emphysematous change.  Heart is enlarged. The pulmonary vascularity reflects underlying emphysema.  IMPRESSION: The overall appearance is felt to be secondary to congestive heart failure superimposed on emphysema and underlying asbestos exposure/ asbestosis. The appearance is stable compared to 2 days prior.   Electronically Signed   By: Lowella Grip M.D.   On: 08/27/2013 08:13   Dg Chest Port 1v Same Day  08/25/2013   CLINICAL DATA:  Shortness of breath and dyspnea  EXAM: PORTABLE CHEST - 1 VIEW SAME DAY  COMPARISON:  08/23/2013  FINDINGS: The heart size is mild to moderately enlarged. There is extensive asbestos related pleural disease bilaterally. Superimposed airspace opacities are identified within the right midlung and left base. There is also mild interstitial edema noted.  IMPRESSION: 1. Bilateral airspace opacities and mild interstitial edema is superimposed upon extensive bilateral asbestos related pleural disease.   Electronically Signed   By: Kerby Moors M.D.   On: 08/25/2013 10:50   ASSESSMENT / PLAN:  PULMONARY A:   Acute respiratory failure Likely COPD with exacerbation, but no PFT available, not on oxygen prior to admission and has minimal pulmonary symptoms Asbestos related pleural disease, but no radiological evidence of parenchymal involvement Posterior upper lobe GGO concerning for BAC P:   Goal pH>7.30,  SpO2>92 Continuous mechanical support VAP bundle Daily SBT Trend ABG/CXR Albuterol / Atrovent Solu-Medrol Needs follow up CT to follow GGO  CARDIOVASCULAR A:   Severe CAD NSTEMI? Moderate to severe AS Hypotension, likely secondary to  procedural sedation / positive pressure ventilation P:  Goal MAP>65 Cardiology following Not a surgical candidate according to TCTS Cath lab / PTCA / stent when  Blood cultures clear Levophed gtt Continue ASA, Zocor, Plavix D/c Nitroglycerin gtt Hold Bisoprolol, Imdur Trend troponin / lactate  RENAL A:  No active issues P:   Trend BMP D/c Lasix as hypotensive  GASTROINTESTINAL A: Nutrition GIPx P:   NPO TF if intubated > 24 hours Protonix  HEMATOLOGIC A:   VTE Px P:  Trend CBC Heparin  INFECTIOUS A:   Staph bacteremia P:   ID following Cultures and antibiotics as above PCT  ENDOCRINE  A:   DM  P:   SSI  NEUROLOGIC A:   Acute encephalopathy P:   Goal RASS 0 to -1 Propofol gtt Fentanyl PRN  [name] Minor ACNP Maryanna Shape PCCM Pager [phone] till 3 pm If no answer page [phone] 08/27/2013, 8:20 AM  I have personally obtained history, examined patient, evaluated and interpreted laboratory and imaging results, reviewed medical records, formulated assessment / plan and placed orders.  CRITICAL CARE:  The patient is critically ill with multiple organ systems failure and requires high complexity decision making for assessment and support, frequent evaluation and titration of therapies, application of advanced monitoring technologies and extensive interpretation of multiple databases. Critical Care Time devoted to patient care services described in this note is 45 minutes.   Doree Fudge, MD Pulmonary and [place] Pager: [phone]  08/27/2013, 1:01 PM

## 2013-08-27 NOTE — Progress Notes (Signed)
I appreciate Pulmonary's input today. Patient now intubated for respiratory failure. CXR is stable with edema on chronic pulmonary changes. Ecg earlier did show some increased ST depression in leads V3-V6. This has improved post intubation. Initial troponin is normal.  Antianginal therapy (nitrates and beta blockers) on hold now due to hypotension. On IV neo for BP. Clearly needs diuresis- will order IV lasix 40 mg bid. I had a long discussion with the family concerning his overall condition and his coronary disease. I reviewed his angiograms with Dr. Burt Knack. He has a high grade ostial LCx stenosis. There is moderate mid LAD disease of 50-70%. For PCI I think we would need to stent the ostial LCx into the left main. The LAD is not critical and I would favor treating this medically. PCI of this lesion as well would increase the complexity of PCI and increase the dye load making it higher risk for contrast induced nephropathy. PCI will be high risk. I explained to the family why we have postponed stenting to date due to active bacteremia, renal insufficiency, and respiratory failure. I explained that PCI would improve his angina and may help with his diastolic CHF but would not likely to impact his other significant medical illnesses. It may be advantageous to perform PCI while he is on ventilator. If his renal function is stable in the am I would consider performing PCI tomorrow pm. If renal function is worse with diuresis then we will need to wait until next week. Will check P2Y12 assay on Plavix.   Ronan Duecker [place] MD, [facility] 08/27/2013 1:35 PM

## 2013-08-27 NOTE — Progress Notes (Signed)
TELEMETRY: Reviewed telemetry pt in sinus bradycardia with rare PVC : Filed Vitals:   08/27/13 0359 08/27/13 0400 08/27/13 0600 08/27/13 0645  BP:  104/61 85/34 114/90  Pulse:  71 65 65  Temp: 97.5 F (36.4 C)     TempSrc: Oral     Resp:      Height:      Weight:      SpO2:  95% 97% 96%    Intake/Output Summary (Last 24 hours) at 08/27/13 0739 Last data filed at 08/26/13 2100  Gross per 24 hour  Intake 1056.75 ml  Output    650 ml  Net 406.75 ml    SUBJECTIVE Restless and agitated during the night. Increased SOB this am. Very uncomfortable. Complains of midsternal chest pain. With increased agitation he has increased stridor.  LABS: Basic Metabolic Panel:  Recent Labs  08/25/13 1042  NA 135*  K 3.9  CL 95*  CO2 25  GLUCOSE 142*  BUN 29*  CREATININE 1.23  CALCIUM 8.1*  CBC:  Recent Labs  08/25/13 1042  WBC 15.5*  NEUTROABS 13.1*  HGB 10.2*  HCT 30.3*  MCV 84.9  PLT 191    Radiology/Studies:  Ct Abdomen Pelvis Wo Contrast  08/19/2013   CLINICAL DATA:  Left-sided flank pain and fever.  EXAM: CT ABDOMEN AND PELVIS WITHOUT CONTRAST  TECHNIQUE: Multidetector CT imaging of the abdomen and pelvis was performed following the standard protocol without intravenous contrast.  COMPARISON:  No priors.  FINDINGS: Lung Bases: Extensive calcified pleural plaques throughout the lung bases bilaterally, likely related to asbestos related pleural disease. Small left pleural effusion layering dependently. Assessment of the lung parenchyma in the lung bases is limited by considerable respiratory motion, however, there is likely some scarring and perhaps developing rounded atelectasis in the right lung base, particularly in the right lower lobe. Extensive aortic valve calcifications. Atherosclerotic calcifications are noted in the left anterior descending, left circumflex and right coronary arteries. Small hiatal hernia.  Abdomen/Pelvis: Iodinated contrast material is noted in the  collecting systems of the kidneys bilaterally, the ureters bilaterally and within the lumen of the urinary bladder, related to recent cath lab procedure. Mild bilateral perinephric stranding. Low attenuation throughout the hepatic parenchyma, compatible with hepatic steatosis. The unenhanced appearance of the gallbladder, pancreas and bilateral adrenal glands is unremarkable. Spleen is enlarged measuring 10.4 x 16.5 x 15.4 cm. Extensive atherosclerosis throughout the abdominal and pelvic vasculature, without evidence of aneurysm. Numerous colonic diverticulae are noted, without surrounding inflammatory changes to suggest an acute diverticulitis at this time. No significant volume of ascites. No pneumoperitoneum. No pathologic distention of small bowel. No definite lymphadenopathy identified within the abdomen or pelvis on today's non contrast CT examination.  Musculoskeletal: There are no aggressive appearing lytic or blastic lesions noted in the visualized portions of the skeleton.  IMPRESSION: 1. No acute findings in the abdomen or pelvis to account for the patient's symptoms. 2. Colonic diverticulosis without findings to suggest acute diverticulitis at this time. 3. Splenomegaly. 4. Asbestos related pleural disease. 5. Trace left pleural effusion. 6. Extensive atherosclerosis, including at least 3 vessel coronary artery disease. 7. There are calcifications of the aortic valve. Echocardiographic correlation for evaluation of potential valvular dysfunction may be warranted if clinically indicated. 8. Small hiatal hernia. 9. Hepatic steatosis. 10. Additional incidental findings, as above.   Electronically Signed   By: Vinnie Langton M.D.   On: 08/13/2013 16:25    Ct Chest Wo Contrast  08/23/2013  CLINICAL DATA:  History of asbestos exposure. Evaluate for potential asbestosis.  EXAM: CT CHEST WITHOUT CONTRAST  TECHNIQUE: Multidetector CT imaging of the chest was performed following the standard protocol  without IV contrast.  COMPARISON:  CT of the abdomen and pelvis 08/11/2013.  FINDINGS: Mediastinum: Heart size is mildly enlarged. There is no significant pericardial fluid, thickening or pericardial calcification. Multiple borderline enlarged mediastinal lymph nodes. No definite pathologic mediastinal or hilar nodal enlargement. Please note that accurate exclusion of hilar adenopathy is limited on noncontrast CT scans. Small hiatal hernia with some circumferential thickening of the distal third of the esophagus. There is atherosclerosis of the thoracic aorta, the great vessels of the mediastinum and the coronary arteries, including calcified atherosclerotic plaque in the left main, left anterior descending, left circumflex and right coronary arteries. Severe calcifications of the aortic valve.  Lungs/Pleura: Extensive calcified pleural plaque is again noted throughout the thorax bilaterally, compatible with asbestos related pleural disease. Associated with this there are pleural-based areas of architectural distortion, some of which are slightly masslike, most notable in the right lower lobe and the left upper lobe, favored to reflect areas of developing rounded atelectasis. Several areas of ground-glass attenuation are seen scattered throughout the lungs bilaterally, which are nonspecific, largest of which is in the posterior aspect of the left upper lobe (image 16 of series 3 and image 76 of series [id number]) measuring approximately 2.3 x 1.4 x 3.1 cm. No definite areas of subpleural reticulation, traction bronchiectasis or frank honeycombing are identified at this time.  Upper Abdomen: Referred to contemporaneous CT of the abdomen and pelvis 08/16/2013 for full description of abdominal findings.  Musculoskeletal: There are no aggressive appearing lytic or blastic lesions noted in the visualized portions of the skeleton.  IMPRESSION: 1. Severe asbestos related pleural disease with multifocal areas of probable  developing rounded atelectasis, as detailed above. 2. No definite imaging findings at this time to strongly suggest asbestosis in the lungs, however, given the extent of multifocal ground-glass attenuation, early changes of asbestosis is not excluded, and followup high-resolution chest CT is recommended to assess for temporal changes in the appearance of the lung parenchyma. 3. Amongst the areas of ground-glass attenuation there is one that is slightly mass like in appearance. This is nonspecific, but measures approximately 3.1 x 1.4 x 2.3 cm in the posterior aspect of the left upper lobe. The possibility of an adenocarcinoma is not excluded, and repeat evaluation with high-resolution chest CT in 3 months is recommended at this time to confirm persistence and assess stability. This recommendation follows the consensus statement: Recommendations for the Management of Subsolid Pulmonary Nodules Detected at CT: A Statement from the Fleischner Society as published in Radiology 2013; 266:304-317. 4. Atherosclerosis, including left main and 3 vessel coronary artery disease. Assessment for potential risk factor modification, dietary therapy or pharmacologic therapy may be warranted, if clinically indicated. 5. There are calcifications of the aortic valve. Echocardiographic correlation for evaluation of potential valvular dysfunction may be warranted if clinically indicated. 6. Mild cardiomegaly.   Electronically Signed   By: Vinnie Langton M.D.   On: 08/23/2013 12:14  Ecg: 08/22/13 NSR with incomplete RBBB, Nonspecific ST-T changes.   CLINICAL DATA: Abdominal pain.  EXAM: ABDOMEN - 1 VIEW  COMPARISON: CT ABD/PELV WO CM dated 08/26/2013  FINDINGS: The bowel gas pattern is normal. No soft tissue abnormalities are identified. No abnormal calcifications are seen by radiography. Degenerative changes of the spine are present.  IMPRESSION: Normal bowel gas pattern.  Electronically Signed By: Aletta Edouard  M.D. On: 08/25/2013 10:51  EXAM: PORTABLE CHEST - 1 VIEW SAME DAY  COMPARISON: 08/23/2013  FINDINGS: The heart size is mild to moderately enlarged. There is extensive asbestos related pleural disease bilaterally. Superimposed airspace opacities are identified within the right midlung and left base. There is also mild interstitial edema noted.  IMPRESSION: 1. Bilateral airspace opacities and mild interstitial edema is superimposed upon extensive bilateral asbestos related pleural disease.   Electronically Signed By: Kerby Moors M.D. On: 08/25/2013 10:50    Echo: Study Conclusions  - Left ventricle: The cavity size was normal. Wall thickness was normal. Systolic function was normal. The estimated ejection fraction was in the range of 55% to 60%. Although no diagnostic regional wall motion abnormality was identified, this possibility cannot be completely excluded on the basis of this study. Features are consistent with a pseudonormal left ventricular filling pattern, with concomitant abnormal relaxation and increased filling pressure (grade 2 diastolic dysfunction). - Aortic valve: There was moderate stenosis. Valve area: 1.28cm^2(VTI).  Ecg: NSR, incomplete RBBB, nonspecific ST-T changes.   PHYSICAL EXAM General: Well developed, obese, appears very uncomfortable. Moaning Head: Normal, sclera non-icteric. Neck: Negative for carotid bruits. JVD not elevated. Lungs: Bilateral diffuse wheezes/crackles, come stridor. Heart: RRR S1 S2 with 2/6 systolic murmur RUSB. Abdomen: Soft, obese, soft, normal BS, No hepatomegaly.  No obvious abdominal masses. Extremities: No clubbing, cyanosis or edema.  Distal pedal pulses are 2+ and equal bilaterally. Neuro: Alert and oriented X 3. Moves all extremities spontaneously.   ASSESSMENT AND PLAN: 1. Coagulase negative staph bacteremia. MRSA. Cultures from 08/24/13 are negative to date. Continue Vancomycin per ID. Cannot place PICC  line until bacteremia clears. Patient is at increased risk for endocarditis of the AV. Plan is to complete 6 week course of antibiotics. If unable to clear bacteremia may need to consider TEE but currently this will not alter therapy. 2. NSTEMI. 2 vessel obstructive CAD by cath with severe ostial LCx stenosis and moderate disease in the mid LAD.  He is not a  surgical candidate. The coronary lesions are suitable but high risk for PCI but need to wait until infection cleared.  Currently on ASA, Plavix, bisoprolol, Imdur. Ecg unchanged with chest pain. He is not a candidate for PCI at this time due to infection and inability to tolerate procedure with his breathing. 3. Moderate aortic stenosis. 4. COPD/asbestosis.  Oxygenating well. Increased SOB, wheezing, and ? stridor.  On Symbicort, Spiriva, Xopenex nebs. On PPI. His breathing has really been his most limiting factor. I will ask pulmonary to see today. Will give IV lasix x 1 and repeat CXR. 5. Hyperlipidemia. On statin Rx. 6. Bladder obstruction. Now with foley in place. On flomax. Consider [place] foley once able to be up more.  7. Acute renal insufficiency. Baseline creatinine 1.0. Now 1.8>>1.5>>1.45>>1.23.  8. DM type 2.  9. Constipation. Improved. Continue colace and miralax. 10. Hyponatremia.  Will check BMET, CBC, BNP this am. Give IV lasix. Check CXR. Pulmonary consult.  Principal Problem:   Bacteremia due to Staphylococcus Active Problems:   COPD (chronic obstructive pulmonary disease) with emphysema   Diabetes mellitus type 2, diet-controlled   Aortic stenosis   HTN (hypertension)   HLD (hyperlipidemia)   Elevated PSA   Heme positive stool   NSTEMI (non-ST elevated myocardial infarction)   Acute renal failure   CAD (coronary artery disease)   Normocytic anemia   Diastolic CHF   Asbestosis   Atherosclerosis  Splenomegaly   Nephrolithiasis   DJD (degenerative joint disease)    Signed, Alante Weimann [place] MD,FACC 08/27/2013 7:39  AM

## 2013-08-27 NOTE — Progress Notes (Signed)
ANTICOAGULATION CONSULT NOTE - Initial Consult  Pharmacy Consult for Heparin Indication: chest pain/ACS  Allergies  Allergen Reactions  . Sulfa Antibiotics Nausea Only    Patient Measurements: Height: 5\' 6"  (167.6 cm) Weight: 220 lb 10.9 oz (100.1 kg) IBW/kg (Calculated) : 63.8 Heparin Dosing Weight: 85.9 kg  Vital Signs: Temp: 97 F (36.1 C) (02/19 1200) Temp src: Oral (02/19 1200) BP: 92/35 mmHg (02/19 1330) Pulse Rate: 59 (02/19 1330)  Labs:  Recent Labs  08/25/13 1042 08/27/13 0805 08/27/13 1015  HGB 10.2* 9.7*  --   HCT 30.3* 29.4*  --   PLT 191 248  --   CREATININE 1.23 1.48*  --   TROPONINI <0.30  --  <0.30    Estimated Creatinine Clearance: 48.5 ml/min (by C-G formula based on Cr of 1.48).   Medical History: Past Medical History  Diagnosis Date  . COPD (chronic obstructive pulmonary disease)   . Type 2 diabetes mellitus, controlled   . Asthma   . Arthritis     with DIP nodules  . History of diverticulitis of colon   . Seasonal allergic rhinitis   . Aortic stenosis     mod by last echo (04/2013) with EF 55%  . HTN (hypertension)   . HLD (hyperlipidemia)   . 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 Asbestos exposure     Medications:  Scheduled:  . aspirin EC  81 mg Oral Daily  . budesonide-formoterol  2 puff Inhalation BID  . clopidogrel  75 mg Oral Q breakfast  . docusate sodium  100 mg Oral Daily  . etomidate      . furosemide  40 mg Intravenous BID  . insulin aspart  0-9 Units Subcutaneous TID WC  . levalbuterol  0.63 mg Nebulization Q6H  . lidocaine (cardiac) 100 mg/71ml      . methylPREDNISolone (SOLU-MEDROL) injection  60 mg Intravenous 3 times per day  . nystatin  5 mL Oral QID  . pantoprazole  40 mg Oral Daily  . polyethylene glycol  17 g Oral Daily  . propofol      . rocuronium      . simvastatin  40 mg Oral q1800  . succinylcholine      . tiotropium  18 mcg Inhalation Daily  . vancomycin   750 mg Intravenous Q12H    Assessment: 73 yo M presenting on 2/12 with NSTEMI s/p cath on 2/13 showing significant CAD. Initially on heparin until 2/16. Now with CoNS bacteremia treated with vancomycin and intubated for respiratory failure. Recently he has been having significant CP and EKG today showed increased ST depression with initial normal troponins. Pharmacy consulted to restart heparin and plan for PCI tomorrow if renal function stable. H/H low but stable, plt wnl with no reported s/s bleeding.   Goal of Therapy:  Heparin level 0.3-0.7 units/ml Monitor platelets by anticoagulation protocol: Yes   Plan:  - Restart heparin at 2400 units/hr (patient was previously therapeutic at this rate) - HL in 8 hrs - Monitor daily HL, CBC, s/s bleeding  Harolyn Rutherford, PharmD Clinical Pharmacist - Resident Pager: [phone] Pharmacy: [phone] 08/27/2013 2:02 PM

## 2013-08-27 NOTE — Procedures (Signed)
Central Venous Catheter Insertion Procedure Note Karem Tomaso [id number] 1940/07/24  Procedure: Insertion of Central Venous Catheter Indications: Drug and/or fluid administration and Frequent blood sampling  Procedure Details Consent: Risks of procedure as well as the alternatives and risks of each were explained to the (patient/caregiver).  Consent for procedure obtained. Time Out: Verified patient identification, verified procedure, site/side was marked, verified correct patient position, special equipment/implants available, medications/allergies/relevent history reviewed, required imaging and test results available.  Performed  Maximum sterile technique was used including antiseptics, cap, gloves, gown, hand hygiene, mask and sheet. Skin prep: Chlorhexidine; local anesthetic administered A antimicrobial bonded/coated triple lumen catheter was placed in the left internal jugular vein using the Seldinger technique. Ultrasound guidance used.yes Catheter placed to 20 cm. Blood aspirated via all 3 ports and then flushed x 3. Line sutured x 2 and dressing applied.  Evaluation Blood flow good Complications: No apparent complications Patient did tolerate procedure well. Chest X-ray ordered to verify placement.  CXR: pending.  Georgann Housekeeper, ACNP [facility] Pager [phone] or [phone]  Supervised and present through the entire procedure.  Doree Fudge, MD Pulmonary and [place] Pager: [phone]

## 2013-08-27 NOTE — Procedures (Signed)
Central Venous Catheter Insertion Procedure Note Anthony Turner [id number] 04/15/1941  Procedure: Insertion of Central Venous Catheter Indications: Assessment of intravascular volume, Drug and/or fluid administration and Frequent blood sampling  Procedure Details Consent: Risks of procedure as well as the alternatives and risks of each were explained to the (patient/caregiver).  Consent for procedure obtained. Time Out: Verified patient identification, verified procedure, site/side was marked, verified correct patient position, special equipment/implants available, medications/allergies/relevent history reviewed, required imaging and test results available.  Performed  Maximum sterile technique was used including antiseptics, cap, gloves, gown, hand hygiene, mask and sheet. Skin prep: Chlorhexidine; local anesthetic administered A antimicrobial bonded/coated triple lumen catheter was placed in the left internal jugular vein using the Seldinger technique. Ultrasound guidance used.yes Catheter placed to 20 cm. Blood aspirated via all 3 ports and then flushed x 3. Line sutured x 2 and dressing applied.  Evaluation Blood flow good Complications: No apparent complications Patient did tolerate procedure well. Chest X-ray ordered to verify placement.  CXR: pending.  Anthony Turner Minor ACNP [facility] Turner [facility] Pager [phone] till 3 pm If no answer page [phone] 08/27/2013, 10:20 AM  Reviewed.  Anthony Fudge, MD Pulmonary and [place] Pager: [phone]

## 2013-08-27 NOTE — Procedures (Signed)
Intubation Procedure Note Anthony Turner [id number] 16-Sep-1940  Procedure: Intubation Indications: Respiratory insufficiency  Procedure Details Consent: Risks of procedure as well as the alternatives and risks of each were explained to the (patient/caregiver).  Consent for procedure obtained. Time Out: Verified patient identification, verified procedure, site/side was marked, verified correct patient position, special equipment/implants available, medications/allergies/relevent history reviewed, required imaging and test results available.  Performed  Medications:  Fentanyl 100 mcg Etomidate 20 mg Versed 2 mg NMB   Evaluation Hemodynamic Status: BP stable throughout; O2 sats: stable throughout Patient's Current Condition: stable Complications: No apparent complications Patient did tolerate procedure well. Chest X-ray ordered to verify placement.  CXR: pending.  [name] Minor ACNP [facility] PCCM Pager [phone] till 3 pm If no answer page [phone] 08/27/2013, 10:19 AM  Supervised and present through the entire procedure.  Doree Fudge, MD Pulmonary and [place] Pager: [phone]

## 2013-08-27 NOTE — Progress Notes (Signed)
Dr. Nelda Marseille called re: ABG results, per MD no vent changes now except wean fio2.  Fio2 changed to 60% presently. Sats 99%

## 2013-08-28 ENCOUNTER — Encounter (HOSPITAL_COMMUNITY): Admission: EM | Disposition: E | Payer: Medicare Other | Source: Home / Self Care | Attending: Cardiovascular Disease

## 2013-08-28 ENCOUNTER — Other Ambulatory Visit: Payer: Self-pay

## 2013-08-28 ENCOUNTER — Inpatient Hospital Stay (HOSPITAL_COMMUNITY): Payer: Medicare Other

## 2013-08-28 DIAGNOSIS — J9600 Acute respiratory failure, unspecified whether with hypoxia or hypercapnia: Secondary | ICD-10-CM

## 2013-08-28 DIAGNOSIS — I2510 Atherosclerotic heart disease of native coronary artery without angina pectoris: Secondary | ICD-10-CM

## 2013-08-28 DIAGNOSIS — I959 Hypotension, unspecified: Secondary | ICD-10-CM

## 2013-08-28 HISTORY — PX: PERCUTANEOUS CORONARY STENT INTERVENTION (PCI-S): SHX5485

## 2013-08-28 LAB — POCT ACTIVATED CLOTTING TIME: ACTIVATED CLOTTING TIME: 404 s

## 2013-08-28 LAB — POCT I-STAT 3, ART BLOOD GAS (G3+)
ACID-BASE EXCESS: 5 mmol/L — AB (ref 0.0–2.0)
Acid-Base Excess: 1 mmol/L (ref 0.0–2.0)
Acid-Base Excess: 2 mmol/L (ref 0.0–2.0)
BICARBONATE: 27.4 meq/L — AB (ref 20.0–24.0)
Bicarbonate: 30.3 mEq/L — ABNORMAL HIGH (ref 20.0–24.0)
Bicarbonate: 27.8 mEq/L — ABNORMAL HIGH (ref 20.0–24.0)
O2 SAT: 99 %
O2 SAT: 96 %
O2 Saturation: 91 %
PCO2 ART: 46.6 mmHg — AB (ref 35.0–45.0)
PH ART: 7.378 (ref 7.350–7.450)
PO2 ART: 134 mmHg — AB (ref 80.0–100.0)
PO2 ART: 67 mmHg — AB (ref 80.0–100.0)
Patient temperature: 98.6
TCO2: 32 mmol/L (ref 0–100)
TCO2: 29 mmol/L (ref 0–100)
TCO2: 29 mmol/L (ref 0–100)
pCO2 arterial: 46.1 mmHg — ABNORMAL HIGH (ref 35.0–45.0)
pCO2 arterial: 53.6 mmHg — ABNORMAL HIGH (ref 35.0–45.0)
pH, Arterial: 7.426 (ref 7.350–7.450)
pH, Arterial: 7.323 — ABNORMAL LOW (ref 7.350–7.450)
pO2, Arterial: 88 mmHg (ref 80.0–100.0)

## 2013-08-28 LAB — BASIC METABOLIC PANEL
BUN: 42 mg/dL — AB (ref 6–23)
CALCIUM: 7.9 mg/dL — AB (ref 8.4–10.5)
CO2: 27 meq/L (ref 19–32)
Chloride: 97 mEq/L (ref 96–112)
Creatinine, Ser: 1.42 mg/dL — ABNORMAL HIGH (ref 0.50–1.35)
GFR calc Af Amer: 55 mL/min — ABNORMAL LOW (ref >90)
GFR, EST NON AFRICAN AMERICAN: 47 mL/min — AB (ref >90)
GLUCOSE: 239 mg/dL — AB (ref 70–99)
Potassium: 4.6 mEq/L (ref 3.7–5.3)
SODIUM: 140 meq/L (ref 137–147)

## 2013-08-28 LAB — CBC
HCT: 28.9 % — ABNORMAL LOW (ref 39.0–52.0)
HEMATOCRIT: 31.2 % — AB (ref 39.0–52.0)
HEMOGLOBIN: 9.6 g/dL — AB (ref 13.0–17.0)
Hemoglobin: 10.4 g/dL — ABNORMAL LOW (ref 13.0–17.0)
MCH: 28.1 pg (ref 26.0–34.0)
MCH: 28.7 pg (ref 26.0–34.0)
MCHC: 33.2 g/dL (ref 30.0–36.0)
MCHC: 33.3 g/dL (ref 30.0–36.0)
MCV: 84.5 fL (ref 78.0–100.0)
MCV: 86 fL (ref 78.0–100.0)
PLATELETS: 436 10*3/uL — AB (ref 150–400)
PLATELETS: 637 10*3/uL — AB (ref 150–400)
RBC: 3.42 MIL/uL — ABNORMAL LOW (ref 4.22–5.81)
RBC: 3.63 MIL/uL — AB (ref 4.22–5.81)
RDW: 14.5 % (ref 11.5–15.5)
RDW: 14.7 % (ref 11.5–15.5)
WBC: 23.7 10*3/uL — ABNORMAL HIGH (ref 4.0–10.5)
WBC: 32 10*3/uL — AB (ref 4.0–10.5)

## 2013-08-28 LAB — GLUCOSE, CAPILLARY
GLUCOSE-CAPILLARY: 201 mg/dL — AB (ref 70–99)
GLUCOSE-CAPILLARY: 248 mg/dL — AB (ref 70–99)
Glucose-Capillary: 209 mg/dL — ABNORMAL HIGH (ref 70–99)
Glucose-Capillary: 252 mg/dL — ABNORMAL HIGH (ref 70–99)

## 2013-08-28 LAB — LACTIC ACID, PLASMA: LACTIC ACID, VENOUS: 1.5 mmol/L (ref 0.5–2.2)

## 2013-08-28 LAB — HEPARIN LEVEL (UNFRACTIONATED)
Heparin Unfractionated: 0.73 IU/mL — ABNORMAL HIGH (ref 0.30–0.70)
Heparin Unfractionated: 0.85 IU/mL — ABNORMAL HIGH (ref 0.30–0.70)

## 2013-08-28 LAB — CREATININE, SERUM
Creatinine, Ser: 1.44 mg/dL — ABNORMAL HIGH (ref 0.50–1.35)
GFR, EST AFRICAN AMERICAN: 54 mL/min — AB (ref >90)
GFR, EST NON AFRICAN AMERICAN: 46 mL/min — AB (ref >90)

## 2013-08-28 LAB — PROCALCITONIN: Procalcitonin: 0.36 ng/mL

## 2013-08-28 SURGERY — PERCUTANEOUS CORONARY STENT INTERVENTION (PCI-S)
Anesthesia: LOCAL

## 2013-08-28 MED ORDER — SODIUM CHLORIDE 0.9 % IV SOLN: 1.0000 mL/kg/h | INTRAVENOUS | Status: AC

## 2013-08-28 MED ORDER — MIDAZOLAM HCL 2 MG/2ML IJ SOLN
INTRAMUSCULAR | Status: AC
  Filled 2013-08-28: qty 2

## 2013-08-28 MED ORDER — SODIUM CHLORIDE 0.9 % IV SOLN
25.0000 ug/h | INTRAVENOUS | Status: DC
  Administered 2013-08-28: 100 ug/h via INTRAVENOUS
  Administered 2013-08-29: 250 ug/h via INTRAVENOUS
  Administered 2013-08-29: 100 ug/h via INTRAVENOUS
  Administered 2013-08-29 – 2013-08-30 (×3): 250 ug/h via INTRAVENOUS
  Administered 2013-08-31 – 2013-09-01 (×2): 150 ug/h via INTRAVENOUS
  Filled 2013-08-28 (×7): qty 50

## 2013-08-28 MED ORDER — SODIUM CHLORIDE 0.9 % IV SOLN: 1.0000 mL/kg/h | INTRAVENOUS | Status: DC

## 2013-08-28 MED ORDER — BIVALIRUDIN 250 MG IV SOLR
INTRAVENOUS | Status: AC
  Filled 2013-08-28: qty 250

## 2013-08-28 MED ORDER — PRO-STAT SUGAR FREE PO LIQD
30.0000 mL | Freq: Every day | ORAL | Status: DC
  Administered 2013-08-28 – 2013-08-31 (×4): 30 mL
  Filled 2013-08-28 (×5): qty 30

## 2013-08-28 MED ORDER — SODIUM CHLORIDE 0.9 % IJ SOLN: 3.0000 mL | INTRAMUSCULAR | Status: DC | PRN

## 2013-08-28 MED ORDER — PANTOPRAZOLE SODIUM 40 MG PO PACK
40.0000 mg | PACK | Freq: Every day | ORAL | Status: DC
  Administered 2013-08-28 – 2013-08-31 (×4): 40 mg
  Filled 2013-08-28 (×5): qty 20

## 2013-08-28 MED ORDER — DEXMEDETOMIDINE HCL IN NACL 200 MCG/50ML IV SOLN
0.4000 ug/kg/h | INTRAVENOUS | Status: DC
  Administered 2013-08-28: 0.4 ug/kg/h via INTRAVENOUS
  Filled 2013-08-28 (×2): qty 50

## 2013-08-28 MED ORDER — TICAGRELOR 90 MG PO TABS
180.0000 mg | ORAL_TABLET | Freq: Once | ORAL | Status: AC
  Administered 2013-08-28: 180 mg via ORAL
  Filled 2013-08-28: qty 2

## 2013-08-28 MED ORDER — HEPARIN (PORCINE) IN NACL 2-0.9 UNIT/ML-% IJ SOLN
INTRAMUSCULAR | Status: AC
  Filled 2013-08-28: qty 1000

## 2013-08-28 MED ORDER — SODIUM CHLORIDE 0.9 % IV SOLN: 250.0000 mL | INTRAVENOUS | Status: DC | PRN

## 2013-08-28 MED ORDER — ASPIRIN 81 MG PO CHEW
81.0000 mg | CHEWABLE_TABLET | Freq: Every day | ORAL | Status: DC
  Administered 2013-08-28 – 2013-08-31 (×4): 81 mg via ORAL
  Filled 2013-08-28 (×4): qty 1

## 2013-08-28 MED ORDER — HEPARIN SODIUM (PORCINE) 5000 UNIT/ML IJ SOLN
5000.0000 [IU] | Freq: Three times a day (TID) | INTRAMUSCULAR | Status: DC
  Administered 2013-08-28 – 2013-08-31 (×10): 5000 [IU] via SUBCUTANEOUS
  Filled 2013-08-28 (×14): qty 1

## 2013-08-28 MED ORDER — MIDAZOLAM HCL 5 MG/ML IJ SOLN
2.0000 mg/h | INTRAMUSCULAR | Status: DC
  Administered 2013-08-28: 5 mg/h via INTRAVENOUS
  Filled 2013-08-28: qty 10

## 2013-08-28 MED ORDER — FUROSEMIDE 10 MG/ML IJ SOLN
40.0000 mg | Freq: Once | INTRAMUSCULAR | Status: AC
  Administered 2013-08-28: 40 mg via INTRAVENOUS

## 2013-08-28 MED ORDER — DEXTROSE 5 % IV SOLN
1.0000 g | INTRAVENOUS | Status: DC
  Administered 2013-08-28 – 2013-08-31 (×4): 1 g via INTRAVENOUS
  Filled 2013-08-28 (×4): qty 1

## 2013-08-28 MED ORDER — DOCUSATE SODIUM 50 MG/5ML PO LIQD
100.0000 mg | Freq: Every day | ORAL | Status: DC
  Administered 2013-08-28 – 2013-08-31 (×3): 100 mg via ORAL
  Filled 2013-08-28 (×5): qty 10

## 2013-08-28 MED ORDER — SODIUM CHLORIDE 0.9 % IJ SOLN: 3.0000 mL | Freq: Two times a day (BID) | INTRAMUSCULAR | Status: DC

## 2013-08-28 MED ORDER — SODIUM CHLORIDE 0.9 % IV SOLN
1.0000 mg/h | INTRAVENOUS | Status: DC
  Administered 2013-08-28 – 2013-08-29 (×2): 6 mg/h via INTRAVENOUS
  Filled 2013-08-28 (×3): qty 10

## 2013-08-28 MED ORDER — INSULIN ASPART 100 UNIT/ML ~~LOC~~ SOLN
2.0000 [IU] | SUBCUTANEOUS | Status: DC
Start: 2013-08-28 — End: 2013-08-28
  Administered 2013-08-28: 6 [IU] via SUBCUTANEOUS

## 2013-08-28 MED ORDER — FENTANYL CITRATE 0.05 MG/ML IJ SOLN
INTRAMUSCULAR | Status: AC
  Filled 2013-08-28: qty 2

## 2013-08-28 MED ORDER — FUROSEMIDE 10 MG/ML IJ SOLN
5.0000 mg/h | INTRAVENOUS | Status: DC
  Administered 2013-08-28: 5 mg/h via INTRAVENOUS
  Filled 2013-08-28: qty 25

## 2013-08-28 MED ORDER — TICAGRELOR 90 MG PO TABS
ORAL_TABLET | ORAL | Status: AC
  Filled 2013-08-28: qty 2

## 2013-08-28 MED ORDER — TICAGRELOR 90 MG PO TABS
90.0000 mg | ORAL_TABLET | Freq: Two times a day (BID) | ORAL | Status: DC
  Administered 2013-08-29 – 2013-08-31 (×6): 90 mg via ORAL
  Filled 2013-08-28 (×8): qty 1

## 2013-08-28 MED ORDER — DEXMEDETOMIDINE HCL IN NACL 200 MCG/50ML IV SOLN
0.4000 ug/kg/h | INTRAVENOUS | Status: DC
Start: 2013-08-28 — End: 2013-08-28

## 2013-08-28 MED ORDER — NITROGLYCERIN 0.2 MG/ML ON CALL CATH LAB
INTRAVENOUS | Status: AC
  Filled 2013-08-28: qty 1

## 2013-08-28 MED ORDER — SODIUM CHLORIDE 0.9 % IV SOLN
INTRAVENOUS | Status: DC
  Administered 2013-08-28: 1.7 [IU]/h via INTRAVENOUS
  Administered 2013-08-29: 10.3 [IU]/h via INTRAVENOUS
  Administered 2013-08-29: 10.9 [IU]/h via INTRAVENOUS
  Administered 2013-08-29: 10.3 [IU]/h via INTRAVENOUS
  Administered 2013-08-30: 16:00:00 via INTRAVENOUS
  Administered 2013-08-30: 10.4 [IU]/h via INTRAVENOUS
  Administered 2013-08-31: 5.4 [IU]/h via INTRAVENOUS
  Administered 2013-08-31: 9.3 [IU]/h via INTRAVENOUS
  Administered 2013-08-31: 15.2 [IU]/h via INTRAVENOUS
  Filled 2013-08-28 (×11): qty 1

## 2013-08-28 MED ORDER — LIDOCAINE HCL (PF) 1 % IJ SOLN
INTRAMUSCULAR | Status: AC
  Filled 2013-08-28: qty 30

## 2013-08-28 MED ORDER — VITAL HIGH PROTEIN PO LIQD
1000.0000 mL | ORAL | Status: DC
  Administered 2013-08-28 – 2013-08-29 (×2): 1000 mL
  Administered 2013-08-29 (×12)
  Administered 2013-08-30 – 2013-08-31 (×2): 1000 mL
  Filled 2013-08-28 (×5): qty 1000

## 2013-08-28 NOTE — Interval H&P Note (Signed)
History and Physical Interval Note:  08/16/2013 11:24 AM  Anthony Turner  has presented today for surgery, with the diagnosis of blockage  The various methods of treatment have been discussed with the patient and family. After consideration of risks, benefits and other options for treatment, the patient has consented to  Procedure(s): PERCUTANEOUS CORONARY STENT INTERVENTION (PCI-S) (N/A) as a surgical intervention .  The patient's history has been reviewed, patient examined, no change in status, stable for surgery.  I have reviewed the patient's chart and labs.  Questions were answered to the patient's satisfaction.   Cath Lab Visit (complete for each Cath Lab visit)  Clinical Evaluation Leading to the Procedure:   ACS: yes  Non-ACS:    Anginal Classification: CCS IV  Anti-ischemic medical therapy: Maximal Therapy (2 or more classes of medications)  Non-Invasive Test Results: No non-invasive testing performed  Prior CABG: No previous CABG        [facility] 08/19/2013 11:24 AM

## 2013-08-28 NOTE — Progress Notes (Signed)
Inpatient Diabetes Program Recommendations  AACE/ADA: New Consensus Statement on Inpatient Glycemic Control (2013)  Target Ranges:  Prepandial:   less than 140 mg/dL      Peak postprandial:   less than 180 mg/dL (1-2 hours)      Critically ill patients:  140 - 180 mg/dL  Results for [name], [name] (MRN [id number]) as of 08/14/2013 10:57  Ref. Range 08/27/2013 20:17 08/27/2013 23:37 08/09/2013 03:32  Glucose-Capillary Latest Range: 70-99 mg/dL 220 (H) 208 (H) 209 (H)   Inpatient Diabetes Program Recommendations Correction (SSI): consider increasing Novolog scale to MOD during steroid therapy  May also benefit from ICU Protocol if tube feeds will be added. Thank you  Raoul Pitch BSN, RN,CDE Inpatient Diabetes Coordinator [phone] (team pager)

## 2013-08-28 NOTE — Progress Notes (Signed)
14:49 28 Aug 2013 Bp 111/48, on vent. 7 Fr Sheath pulled from rt femoral artery and manual pressure applied for 20 minutes by J.McDaniel RCIS. Rt groin without hematoma or bruising. Attending RN aware.

## 2013-08-28 NOTE — CV Procedure (Signed)
    CARDIAC CATH NOTE  Name: Anthony Turner MRN: [id number] DOB: [birth year]  Procedure: PTCA and stenting of the Ostial LCx  Indication: 73 yo WM with NSTEMI. Prior diagnostic cath showed critical ostial LCx disease with moderate mid LAD stenosis. Clinical course complicated by acute respiratory failure, diastolic CHF, staph bacteremia and refractory angina.   Procedural Details: The right groin was prepped, draped, and anesthetized with 1% lidocaine. Using the modified Seldinger technique, a 7 Fr sheath was introduced into the right femoral artery.  Weight-based bivalirudin was given for anticoagulation. Once a therapeutic ACT was achieved, a 7 [place] XB 3.5 guide catheter was inserted.  A prowater coronary guidewire was used to cross the lesion.  A second prowater wire was placed down the LAD. The lesion was predilated with a 2.5 mm Angiosculpt balloon.  The lesion was then stented with a 3.5 x 16 mm Promus stent covering the ostial LCx and extending into the left main. The LAD wire was then withdrawn and redirected through the stent struts into the LAD again. The stent struts were then dilated into the LAD with a 3.0 mm [place] balloon. The Ostial LCx and left main portion of the stent  was postdilated with a 4.0 mm noncompliant balloon.  Following PCI, there was 0% residual stenosis and TIMI-3 flow in both the LAD and LCx. Final angiography confirmed an excellent result. The patient tolerated the procedure well. He was agitated at times and was sedated with additional Versed and Fentanyl IV.  There were no immediate procedural complications. Angiomax was discontinued post procedure and the patient  was transferred to the ICU for further monitoring and removal of the sheath later. 85 cc of contrast was used.  Lesion Data: Vessel: LCx-ostial Percent stenosis (pre): 95% TIMI-flow (pre):  3 Stent:  3.5 x 16 mm Promus Percent stenosis (post): 0% TIMI-flow (post): 3  Conclusions: Successful stenting of  the LCx extending into the left main.   Recommendations: Continue dual antiplatelet therapy for at least one year and probably indefinitely.   [name] [facility] 08/16/2013, 12:29 PM

## 2013-08-28 NOTE — H&P (View-Only) (Signed)
Subjective: Anthony Turner is a 73 yo man pmh AS, CAD, asbestosis, COPD, p/w NSTEMI on 08/24/2013. LHC mod severe-AS, high grade ostial circumblex lesion, mod-severe LAD, pulm HTN with coag - staph bacteremia not CABG candidate.   Intubated/sedated this AM. VSS.   Objective: Vital signs in last 24 hours: Filed Vitals:   09/04/2013 0500 08/13/2013 0515 08/16/2013 0530 08/12/2013 0600  BP: 111/46 132/46 131/34 124/38  Pulse: 73 73 72 72  Temp:      TempSrc:      Resp:      Height:      Weight: 215 lb 6.2 oz (97.7 kg)     SpO2: 98% 99% 99% 98%   Weight change:   Intake/Output Summary (Last 24 hours) at 08/30/2013 0756 Last data filed at 08/27/2013 0600  Gross per 24 hour  Intake 2823.79 ml  Output   3760 ml  Net -936.21 ml   General: intubated/sedated HEENT: PERRL, EOMI, no scleral icterus Cardiac: RRR, no rubs, harsh 3/6 systolic murmur, no gallops Pulm: rhochus, vented Abd: soft, nontender, nondistended, BS present Ext: warm and well perfused, no pedal edema Neuro: cranial nerves II-XII grossly intact  Lab Results: Basic Metabolic Panel:  Recent Labs Lab 08/12/2013 0915  08/27/13 0805 08/22/2013 0455  NA  --   < > 134* 140  K  --   < > 4.2 4.6  CL  --   < > 93* 97  CO2  --   < > 27 27  GLUCOSE  --   < > 120* 239*  BUN  --   < > 37* 42*  CREATININE  --   < > 1.48* 1.42*  CALCIUM  --   < > 8.0* 7.9*  MG 2.2  --   --   --   < > = values in this interval not displayed.  CBC:  Recent Labs Lab 08/23/13 0300 08/25/13 1042 08/27/13 0805 08/09/2013 0455  WBC 11.7* 15.5* 16.6* 23.7*  NEUTROABS  --  13.1*  --   --   HGB 9.8* 10.2* 9.7* 9.6*  HCT 29.1* 30.3* 29.4* 28.9*  MCV 84.8 84.9 86.0 84.5  PLT 141* 191 248 436*   Cardiac Enzymes:  Recent Labs Lab 08/27/13 1015 08/27/13 1620 08/27/13 2215  TROPONINI <0.30 0.81* 0.67*   BNP:  Recent Labs Lab 08/27/13 0805  PROBNP 2423.0*    CBG:  Recent Labs Lab 08/27/13 0812 08/27/13 1156 08/27/13 1619 08/27/13 2017  08/27/13 2337 08/13/2013 0332  GLUCAP 141* 114* 187* 220* 208* 209*   Cardiac Studies: Echo 10/14: report not available [facility]   Echo 2/15: EF 25-85%, grade 2 diastolic dysfunction, mod AS  Studies/Results: Dg Chest Port 1 View  08/27/2013   CLINICAL DATA:  Status post left central line placement  EXAM: PORTABLE CHEST - 1 VIEW  COMPARISON:  08/27/2013 804 hrs  FINDINGS: Cardiac shadow remains enlarged. An endotracheal tube is now seen with the tip 5.6 cm above the carina. A new left central venous line is noted with the tip in the proximal superior vena cava. No definitive pneumothorax is seen. A nasogastric catheter extends into the stomach beneath the edge of the film. Diffuse pleural calcifications are again seen bilaterally. Patchy interstitial changes are again identified stable from the prior exam. The linear density is noted overlying the mid left cardiac border stable from the prior exam. This is likely related to the pleural calcifications. No other focal abnormality is seen.  IMPRESSION: Chronic changes in the lungs bilaterally.  Endotracheal tube, nasogastric catheter and left central venous line in satisfactory position. No pneumothorax is noted.   Electronically Signed   By: Inez Catalina M.D.   On: 08/27/2013 11:23   Dg Chest Port 1v Same Day  08/27/2013   CLINICAL DATA:  Shortness of Breath  EXAM: PORTABLE CHEST - 1 VIEW SAME DAY  COMPARISON:  August 23, 2013 chest CT and August 25, 2013 chest radiograph  FINDINGS: There is extensive pleural and diaphragmatic calcification consistent with prior asbestos exposure. There is diffuse interstitial disease bilaterally which may be due to to acute interstitial edema superimposed on chronic interstitial disease, likely due to asbestosis. There is also underlying emphysematous change.  Heart is enlarged. The pulmonary vascularity reflects underlying emphysema.  IMPRESSION: The overall appearance is felt to be secondary to congestive heart failure  superimposed on emphysema and underlying asbestos exposure/ asbestosis. The appearance is stable compared to 2 days prior.   Electronically Signed   By: Lowella Grip M.D.   On: 08/27/2013 08:13   Medications: I have reviewed the patient's current medications. Scheduled Meds: . antiseptic oral rinse  15 mL Mouth Rinse QID  . aspirin EC  81 mg Oral Daily  . budesonide-formoterol  2 puff Inhalation BID  . chlorhexidine  15 mL Mouth/Throat BID  . clopidogrel  75 mg Oral Q breakfast  . docusate sodium  100 mg Oral Daily  . furosemide  40 mg Intravenous BID  . insulin aspart  0-9 Units Subcutaneous Q4H  . levalbuterol  0.63 mg Nebulization Q6H  . methylPREDNISolone (SOLU-MEDROL) injection  60 mg Intravenous 3 times per day  . nystatin  5 mL Oral QID  . pantoprazole  40 mg Oral Daily  . polyethylene glycol  17 g Oral Daily  . simvastatin  40 mg Oral q1800  . tiotropium  18 mcg Inhalation Daily  . vancomycin  750 mg Intravenous Q12H   Continuous Infusions: . heparin 2,250 Units/hr (08/19/2013 0136)  . norepinephrine (LEVOPHED) Adult infusion 14 mcg/min (09/05/2013 0645)  . propofol 40 mcg/kg/min (08/26/2013 0341)   PRN Meds:.sodium chloride, fentaNYL, levalbuterol, magnesium hydroxide, nitroGLYCERIN, ondansetron (ZOFRAN) IV, sodium phosphate Assessment/Plan: #VDRF: pt intubated yesterday for respiratory failure in setting of COPD and asbestosis. management per PCCM. Pt being treated for possible COPD exacerbation.  #hypotension: in setting of intubation and NSTEMI. trops flat trend. Pt continues on levo gtt (origionally 24mcg>>13).   # NSTEMI. 2 vessel obstructive CAD by 08/27/2013 LHC with severe ostial LCx stenosis and moderate disease in the mid LAD. He is not a good surgical candidate. The coronary lesions are suitable but high risk for PCI.  Pt P2Y12 297 indicating poor response to plavix. Pt did have trop elevation overnight (peak 0.81) from previously normal.  -d/c plavix -LHC with PCI  08/27/2013 extensive education with risk of procedure given to family -Brilinta load and then continue BID on 08/29/13  # Coagulase negative staph bacteremia. MRSA. Cultures from 08/24/13 are negative to date. Continue Vancomycin per ID. Cannot place PICC line until bacteremia clears. Patient is at increased risk for endocarditis of the AV. Plan is to complete 6 week course of antibiotics. If unable to clear bacteremia may need to consider TEE but currently this will not alter therapy.   #Acute renal insufficiency. Baseline creatinine 1.0. Now 1.8>>1.5>>1.45>>1.23>>1.4.  # Moderate aortic stenosis.  # COPD/asbestosis.  # Hyperlipidemia. On statin Rx.  # Bladder obstruction. Now with foley in place. On flomax. Consider [place] foley once able to be up  more.    # DM type 2.    LOS: 8 days   Clinton Gallant, MD 08/30/2013, 7:56 AM Patient seen and examined and history reviewed. Agree with above findings and plan. Patient stable overnight on vent. Still on Neo IV for pressure support but weaning. Renal function is stable. With events of yesterday patient did bump his troponins c/w NSTEMI. I think now is the window of opportunity for treating his CAD with stenting of the ostial LCx. Patient is a nonresponder to Plavix. Will load with Brilinta. Still needs diuresis. Continue Vent support per CCM.  [name] [facility] 09/04/2013 10:50 AM

## 2013-08-28 NOTE — Progress Notes (Signed)
Agree with interventions and documentation of dietetic intern. Discussed with RN who reports pt to remain intubated.   Brynda Greathouse, MS RD LDN Clinical Inpatient Dietitian Pager: [phone] Weekend/After hours pager: [phone]

## 2013-08-28 NOTE — Progress Notes (Signed)
Cath lab notified that brilinta 180mg  has not been given as it has not been sent to floor yet.

## 2013-08-28 NOTE — Progress Notes (Signed)
Patient ID: Anthony Turner, male   DOB: March 18, 1941, 73 y.o.   MRN: [id number]         [facility]    Date of Admission:  08/09/2013           Day 7 vancomycin                Principal Problem:   Bacteremia due to Staphylococcus Active Problems:   Aortic stenosis   NSTEMI (non-ST elevated myocardial infarction)   COPD (chronic obstructive pulmonary disease) with emphysema   Diabetes mellitus type 2, diet-controlled   HTN (hypertension)   HLD (hyperlipidemia)   Elevated PSA   Heme positive stool   Acute renal failure   CAD (coronary artery disease)   Normocytic anemia   Diastolic CHF   Asbestosis   Atherosclerosis   Splenomegaly   Nephrolithiasis   DJD (degenerative joint disease)   Acute respiratory failure   COPD with exacerbation   . antiseptic oral rinse  15 mL Mouth Rinse QID  . aspirin  81 mg Oral Daily  . budesonide-formoterol  2 puff Inhalation BID  . chlorhexidine  15 mL Mouth/Throat BID  . docusate  100 mg Oral Daily  . furosemide  40 mg Intravenous BID  . heparin  5,000 Units Subcutaneous 3 times per day  . insulin aspart  0-9 Units Subcutaneous Q4H  . levalbuterol  0.63 mg Nebulization Q6H  . methylPREDNISolone (SOLU-MEDROL) injection  60 mg Intravenous 3 times per day  . nystatin  5 mL Oral QID  . pantoprazole sodium  40 mg Per Tube Daily  . polyethylene glycol  17 g Oral Daily  . simvastatin  40 mg Oral q1800  . sodium chloride  3 mL Intravenous Q12H  . Ticagrelor  180 mg Oral Once  . [START ON 08/29/2013] Ticagrelor  90 mg Oral BID  . tiotropium  18 mcg Inhalation Daily  . vancomycin  750 mg Intravenous Q12H   Objective: Temp:  [97.8 F (36.6 C)-98.6 F (37 C)] 98.6 F (37 C) (02/20 0800) Pulse Rate:  [52-85] 85 (02/20 1322) Resp:  [14-26] 26 (02/20 1322) BP: (106-146)/(29-68) 138/68 mmHg (02/20 1322) SpO2:  [96 %-100 %] 99 % (02/20 1100) FiO2 (%):  [40 %-100 %] 100 % (02/20 1340) Weight:  [97.7 kg (215 lb 6.2 oz)]  97.7 kg (215 lb 6.2 oz) (02/20 0500)  General: He just returned from PTCA and stenting. He remains intubated. He is sweating profusely Lungs: Few rhonchi and wheezes. Scant white sputum being suctioned from the endotracheal tube Cor: Regular S1-S2 with a 2/6 systolic murmur at the right sternal border  Lab Results Lab Results  Component Value Date   WBC 23.7* 08/13/2013   HGB 9.6* 08/19/2013   HCT 28.9* 08/10/2013   MCV 84.5 08/10/2013   PLT 436* 09/03/2013    Lab Results  Component Value Date   CREATININE 1.42* 08/27/2013   BUN 42* 08/19/2013   NA 140 08/22/2013   K 4.6 08/22/2013   CL 97 08/09/2013   CO2 27 08/29/2013    Lab Results  Component Value Date   ALT 6 08/25/2013   AST 19 08/25/2013   ALKPHOS 119* 08/25/2013   BILITOT 0.4 08/25/2013      Microbiology: Recent Results (from the past 240 hour(s))  MRSA PCR SCREENING     Status: None   Collection Time    08/16/2013  4:12 PM      Result Value Ref Range Status  MRSA by PCR NEGATIVE  NEGATIVE Final   Comment:            The GeneXpert MRSA Assay (FDA     approved for NASAL specimens     only), is one component of a     comprehensive MRSA colonization     surveillance program. It is not     intended to diagnose MRSA     infection nor to guide or     monitor treatment for     MRSA infections.  CULTURE, BLOOD (ROUTINE X 2)     Status: None   Collection Time    08/29/2013  3:30 AM      Result Value Ref Range Status   Specimen Description BLOOD LEFT HAND   Final   Special Requests BOTTLES DRAWN AEROBIC AND ANAEROBIC 10CC EACH   Final   Culture  Setup Time     Final   Value: 08/27/2013 08:25     Performed at [facility]   Culture     Final   Value: STAPHYLOCOCCUS SPECIES (COAGULASE NEGATIVE)     Note: RIFAMPIN AND GENTAMICIN SHOULD NOT BE USED AS SINGLE DRUGS FOR TREATMENT OF STAPH INFECTIONS.     Note: Gram Stain Report Called to,Read Back By and Verified With: CHRIS BUNGQUE ON 08/22/2013 AT 12:49A BY Dennard Nip      Performed at [facility]   Report Status 08/24/2013 FINAL   Final   Organism ID, Bacteria STAPHYLOCOCCUS SPECIES (COAGULASE NEGATIVE)   Final  CULTURE, BLOOD (ROUTINE X 2)     Status: None   Collection Time    08/19/2013  3:36 AM      Result Value Ref Range Status   Specimen Description BLOOD LEFT HAND   Final   Special Requests BOTTLES DRAWN AEROBIC AND ANAEROBIC 10CC EACH   Final   Culture  Setup Time     Final   Value: 08/18/2013 08:25     Performed at [facility]   Culture     Final   Value: STAPHYLOCOCCUS SPECIES (COAGULASE NEGATIVE)     Note: SUSCEPTIBILITIES PERFORMED ON PREVIOUS CULTURE WITHIN THE LAST 5 DAYS.     Note: Gram Stain Report Called to,Read Back By and Verified With: CHRIS BUNGQUE ON 08/22/2013 AT 12:49A BY Dennard Nip     Performed at [facility]   Report Status 08/24/2013 FINAL   Final  CULTURE, BLOOD (ROUTINE X 2)     Status: None   Collection Time    08/22/13  2:00 AM      Result Value Ref Range Status   Specimen Description BLOOD RIGHT ARM   Final   Special Requests BOTTLES DRAWN AEROBIC AND ANAEROBIC 10CC   Final   Culture  Setup Time     Final   Value: 08/22/2013 12:10     Performed at [facility]   Culture     Final   Value: STAPHYLOCOCCUS SPECIES (COAGULASE NEGATIVE)     Note: SUSCEPTIBILITIES PERFORMED ON PREVIOUS CULTURE WITHIN THE LAST 5 DAYS.     Note: Gram Stain Report Called to,Read Back By and Verified With: [place] Hodgin RN on 08/23/13 at 02:20 by Rise Mu     Performed at [facility]   Report Status 08/25/2013 FINAL   Final  CULTURE, BLOOD (ROUTINE X 2)     Status: None   Collection Time    08/22/13  2:15 AM      Result Value Ref Range  Status   Specimen Description BLOOD LEFT HAND   Final   Special Requests     Final   Value: BOTTLES DRAWN AEROBIC AND ANAEROBIC 10CC AER,5CC ANA   Culture  Setup Time     Final   Value: 08/22/2013 12:10     Performed at [facility]   Culture     Final    Value: STAPHYLOCOCCUS SPECIES (COAGULASE NEGATIVE)     Note: RIFAMPIN AND GENTAMICIN SHOULD NOT BE USED AS SINGLE DRUGS FOR TREATMENT OF STAPH INFECTIONS.     Note: Gram Stain Report Called to,Read Back By and Verified With: Dellie Burns RN on 08/23/13 at 01:25 by Rise Mu     Performed at [facility]   Report Status 08/25/2013 FINAL   Final   Organism ID, Bacteria STAPHYLOCOCCUS SPECIES (COAGULASE NEGATIVE)   Final  CULTURE, BLOOD (ROUTINE X 2)     Status: None   Collection Time    08/24/13 10:05 AM      Result Value Ref Range Status   Specimen Description BLOOD RIGHT ARM   Final   Special Requests BOTTLES DRAWN AEROBIC ONLY 10CC   Final   Culture  Setup Time     Final   Value: 08/24/2013 13:56     Performed at [facility]   Culture     Final   Value:        BLOOD CULTURE RECEIVED NO GROWTH TO DATE CULTURE WILL BE HELD FOR 5 DAYS BEFORE ISSUING A FINAL NEGATIVE REPORT     Performed at [facility]   Report Status PENDING   Incomplete  CULTURE, BLOOD (ROUTINE X 2)     Status: None   Collection Time    08/24/13 12:30 PM      Result Value Ref Range Status   Specimen Description BLOOD LEFT HAND   Final   Special Requests BOTTLES DRAWN AEROBIC ONLY [facility]   Final   Culture  Setup Time     Final   Value: 08/24/2013 16:16     Performed at [facility]   Culture     Final   Value:        BLOOD CULTURE RECEIVED NO GROWTH TO DATE CULTURE WILL BE HELD FOR 5 DAYS BEFORE ISSUING A FINAL NEGATIVE REPORT     Performed at [facility]   Report Status PENDING   Incomplete    Studies/Results: Dg Chest Port 1 View  08/27/2013   CLINICAL DATA:  Intubation, history of COPD  EXAM: PORTABLE CHEST - 1 VIEW  COMPARISON:  Portable chest x-ray of 08/27/2013  FINDINGS: The tip of the endotracheal tube is approximately 5.2 cm above the carina. Diffuse airspace disease is stable. The does appear to be calcification of the hemidiaphragms with some calcified pleural plaques  indicating asbestos related disease. Mild cardiomegaly is stable. Left IJ central venous line tip is seen to the mid proximal SVC  IMPRESSION: 1. No change in diffuse airspace disease. 2. Tip of endotracheal tube approximately 5.2 cm above the carinal.   Electronically Signed   By: Ivar Drape M.D.   On: 08/13/2013 10:45   Dg Chest Port 1 View  08/27/2013   CLINICAL DATA:  Status post left central line placement  EXAM: PORTABLE CHEST - 1 VIEW  COMPARISON:  08/27/2013 804 hrs  FINDINGS: Cardiac shadow remains enlarged. An endotracheal tube is now seen with the tip 5.6 cm above the carina. A new left central venous  line is noted with the tip in the proximal superior vena cava. No definitive pneumothorax is seen. A nasogastric catheter extends into the stomach beneath the edge of the film. Diffuse pleural calcifications are again seen bilaterally. Patchy interstitial changes are again identified stable from the prior exam. The linear density is noted overlying the mid left cardiac border stable from the prior exam. This is likely related to the pleural calcifications. No other focal abnormality is seen.  IMPRESSION: Chronic changes in the lungs bilaterally.  Endotracheal tube, nasogastric catheter and left central venous line in satisfactory position. No pneumothorax is noted.   Electronically Signed   By: Inez Catalina M.D.   On: 08/27/2013 11:23   Dg Chest Port 1v Same Day  08/27/2013   CLINICAL DATA:  Shortness of Breath  EXAM: PORTABLE CHEST - 1 VIEW SAME DAY  COMPARISON:  August 23, 2013 chest CT and August 25, 2013 chest radiograph  FINDINGS: There is extensive pleural and diaphragmatic calcification consistent with prior asbestos exposure. There is diffuse interstitial disease bilaterally which may be due to to acute interstitial edema superimposed on chronic interstitial disease, likely due to asbestosis. There is also underlying emphysematous change.  Heart is enlarged. The pulmonary vascularity  reflects underlying emphysema.  IMPRESSION: The overall appearance is felt to be secondary to congestive heart failure superimposed on emphysema and underlying asbestos exposure/ asbestosis. The appearance is stable compared to 2 days prior.   Electronically Signed   By: Lowella Grip M.D.   On: 08/27/2013 08:13    Assessment: I will continue vancomycin for his methicillin resistant coagulase-negative staph bacteremia. He is at high risk for aortic valve endocarditis and will need at least 4 weeks of therapy. Repeat blood cultures were negative.  Plan: 1. Continue vancomycin 2. Please call Dr. Johnnye Sima [phone]) for any infectious disease questions this weekend  Michel Bickers, MD [facility] [place] [phone] pager   [phone] cell 09/05/2013, 1:48 PM

## 2013-08-28 NOTE — Procedures (Signed)
Arterial Catheter Insertion Procedure Note Anthony Turner [id number] 08/14/1940  Procedure: Insertion of Arterial Catheter  Indications: Blood pressure monitoring and Frequent blood sampling  Procedure Details Consent: Unable to obtain consent because of emergent medical necessity. Time Out: Verified patient identification, verified procedure, site/side was marked, verified correct patient position, special equipment/implants available, medications/allergies/relevent history reviewed, required imaging and test results available.  Performed  Maximum sterile technique was used including antiseptics, cap, gloves, gown, hand hygiene, mask and sheet. Skin prep: Chlorhexidine; local anesthetic administered 20 gauge catheter was inserted into left radial artery using the Seldinger technique.  Evaluation Blood flow good; BP tracing good. Complications: No apparent complications.   Performed by Georgann Housekeeper, NP  I was present and supervised the entire procedure.  [name], M.D. [facility] Pulmonary/Critical Care Medicine. Pager: [phone]. After hours pager: [phone].

## 2013-08-28 NOTE — Progress Notes (Signed)
Cath lab here to take pt.  Pt coughing, attempting to pull at tubes and sit up.  Precedex titrated up and 15mcg iv fentanyl given.  Will transfer to cath lab

## 2013-08-28 NOTE — Progress Notes (Signed)
ANTIBIOTIC CONSULT NOTE  Pharmacy Consult for Vancomycin  Indication: bacteremia  Allergies  Allergen Reactions  . Sulfa Antibiotics Nausea Only    Patient Measurements: Height: 5\' 6"  (144.8 cm) Weight: 215 lb 6.2 oz (97.7 kg) IBW/kg (Calculated) : 63.8  Vital Signs: Temp: 98.6 F (37 C) (02/20 0800) Temp src: Axillary (02/20 0800) BP: 145/53 mmHg (02/20 1100) Pulse Rate: 80 (02/20 1100) Intake/Output from previous day: 02/19 0701 - 02/20 0700 In: 2823.8 [I.V.:1523.8; IV Piggyback:1300] Out: 3760 [Urine:3480; Emesis/NG output:280] Intake/Output from this shift: Total I/O In: 285.9 [I.V.:285.9] Out: 1000 [Urine:1000]  Labs:  Recent Labs  08/27/13 0805 08/25/2013 0455  WBC 16.6* 23.7*  HGB 9.7* 9.6*  PLT 248 436*  CREATININE 1.48* 1.42*   Estimated Creatinine Clearance: 50 ml/min (by C-G formula based on Cr of 1.42).  Recent Labs  08/26/13 1110  [place] 18.1     Microbiology: Recent Results (from the past 720 hour(s))  MRSA PCR SCREENING     Status: None   Collection Time    09/03/2013  4:12 PM      Result Value Ref Range Status   MRSA by PCR NEGATIVE  NEGATIVE Final   Comment:            The GeneXpert MRSA Assay (FDA     approved for NASAL specimens     only), is one component of a     comprehensive MRSA colonization     surveillance program. It is not     intended to diagnose MRSA     infection nor to guide or     monitor treatment for     MRSA infections.  CULTURE, BLOOD (ROUTINE X 2)     Status: None   Collection Time    08/12/2013  3:30 AM      Result Value Ref Range Status   Specimen Description BLOOD LEFT HAND   Final   Special Requests BOTTLES DRAWN AEROBIC AND ANAEROBIC 10CC EACH   Final   Culture  Setup Time     Final   Value: 09/03/2013 08:25     Performed at [facility]   Culture     Final   Value: STAPHYLOCOCCUS SPECIES (COAGULASE NEGATIVE)     Note: RIFAMPIN AND GENTAMICIN SHOULD NOT BE USED AS SINGLE DRUGS FOR TREATMENT  OF STAPH INFECTIONS.     Note: Gram Stain Report Called to,Read Back By and Verified With: CHRIS BUNGQUE ON 08/22/2013 AT 12:49A BY Dennard Nip     Performed at [facility]   Report Status 08/24/2013 FINAL   Final   Organism ID, Bacteria STAPHYLOCOCCUS SPECIES (COAGULASE NEGATIVE)   Final  CULTURE, BLOOD (ROUTINE X 2)     Status: None   Collection Time    09/04/2013  3:36 AM      Result Value Ref Range Status   Specimen Description BLOOD LEFT HAND   Final   Special Requests BOTTLES DRAWN AEROBIC AND ANAEROBIC 10CC EACH   Final   Culture  Setup Time     Final   Value: 08/23/2013 08:25     Performed at [facility]   Culture     Final   Value: STAPHYLOCOCCUS SPECIES (COAGULASE NEGATIVE)     Note: SUSCEPTIBILITIES PERFORMED ON PREVIOUS CULTURE WITHIN THE LAST 5 DAYS.     Note: Gram Stain Report Called to,Read Back By and Verified With: CHRIS BUNGQUE ON 08/22/2013 AT 12:49A BY [employer]     Performed at [facility]  Report Status 08/24/2013 FINAL   Final  CULTURE, BLOOD (ROUTINE X 2)     Status: None   Collection Time    08/22/13  2:00 AM      Result Value Ref Range Status   Specimen Description BLOOD RIGHT ARM   Final   Special Requests BOTTLES DRAWN AEROBIC AND ANAEROBIC 10CC   Final   Culture  Setup Time     Final   Value: 08/22/2013 12:10     Performed at [facility]   Culture     Final   Value: STAPHYLOCOCCUS SPECIES (COAGULASE NEGATIVE)     Note: SUSCEPTIBILITIES PERFORMED ON PREVIOUS CULTURE WITHIN THE LAST 5 DAYS.     Note: Gram Stain Report Called to,Read Back By and Verified With: [place] Hodgin RN on 08/23/13 at 02:20 by Rise Mu     Performed at [facility]   Report Status 08/25/2013 FINAL   Final  CULTURE, BLOOD (ROUTINE X 2)     Status: None   Collection Time    08/22/13  2:15 AM      Result Value Ref Range Status   Specimen Description BLOOD LEFT HAND   Final   Special Requests     Final   Value: BOTTLES DRAWN AEROBIC AND ANAEROBIC  10CC AER,5CC ANA   Culture  Setup Time     Final   Value: 08/22/2013 12:10     Performed at [facility]   Culture     Final   Value: STAPHYLOCOCCUS SPECIES (COAGULASE NEGATIVE)     Note: RIFAMPIN AND GENTAMICIN SHOULD NOT BE USED AS SINGLE DRUGS FOR TREATMENT OF STAPH INFECTIONS.     Note: Gram Stain Report Called to,Read Back By and Verified With: Dellie Burns RN on 08/23/13 at 01:25 by Rise Mu     Performed at [facility]   Report Status 08/25/2013 FINAL   Final   Organism ID, Bacteria STAPHYLOCOCCUS SPECIES (COAGULASE NEGATIVE)   Final  CULTURE, BLOOD (ROUTINE X 2)     Status: None   Collection Time    08/24/13 10:05 AM      Result Value Ref Range Status   Specimen Description BLOOD RIGHT ARM   Final   Special Requests BOTTLES DRAWN AEROBIC ONLY 10CC   Final   Culture  Setup Time     Final   Value: 08/24/2013 13:56     Performed at [facility]   Culture     Final   Value:        BLOOD CULTURE RECEIVED NO GROWTH TO DATE CULTURE WILL BE HELD FOR 5 DAYS BEFORE ISSUING A FINAL NEGATIVE REPORT     Performed at [facility]   Report Status PENDING   Incomplete  CULTURE, BLOOD (ROUTINE X 2)     Status: None   Collection Time    08/24/13 12:30 PM      Result Value Ref Range Status   Specimen Description BLOOD LEFT HAND   Final   Special Requests BOTTLES DRAWN AEROBIC ONLY [facility]   Final   Culture  Setup Time     Final   Value: 08/24/2013 16:16     Performed at [facility]   Culture     Final   Value:        BLOOD CULTURE RECEIVED NO GROWTH TO DATE CULTURE WILL BE HELD FOR 5 DAYS BEFORE ISSUING A FINAL NEGATIVE REPORT     Performed at [facility]  Report Status PENDING   Incomplete    Anti-infectives   Start     Dose/Rate Route Frequency Ordered Stop   08/26/13 1600  vancomycin (VANCOCIN) IVPB 750 mg/150 ml premix     750 mg 150 mL/hr over 60 Minutes Intravenous Every 12 hours 08/26/13 1458     08/22/13 1600  ceFAZolin (ANCEF)  IVPB 2 g/50 mL premix  Status:  Discontinued     2 g 100 mL/hr over 30 Minutes Intravenous 3 times per day 08/22/13 1552 08/24/13 1457   08/22/13 1200  vancomycin (VANCOCIN) IVPB 750 mg/150 ml premix  Status:  Discontinued     750 mg 150 mL/hr over 60 Minutes Intravenous Every 12 hours 08/22/13 0125 08/26/13 1458   08/22/13 0130  vancomycin (VANCOCIN) IVPB 750 mg/150 ml premix     750 mg 150 mL/hr over 60 Minutes Intravenous  Once 08/22/13 0125 08/22/13 0315   09/03/2013 1000  levofloxacin (LEVAQUIN) tablet 500 mg  Status:  Discontinued     500 mg Oral Daily 08/24/2013 1627 08/22/13 1552      Assessment: 73 yo male admitted 2/12 for NSTEMI now with coagulase negative Staph bacteremia started on IV Abx 2/14. Patient remains afebrile, wbc now trending up to 23.7 on steroids, SCr was initially improving and now worsening again (1.42), CrCl ~50 ml/min. CoNS is methicillin resistant. Vancomycin at goal so will continue current dosing but likely recheck VT next week since renal function continues to fluctuate.   Vancomycin trough 2/16= 15.6.  Vancomycin trough 2/18= 18.1 (slight accumulation from earlier in the week)  2/13 LVQ>>2/14 2/14 Vanc>> 2/14 Cefazolin>>2/16  2/16 BCx2>>ngtd 2/14 BCx2>>CoNS 2/13 BCx2>>CoNS-sen to vanc 2/12 MRSA PCR>>NEG  Goal of Therapy:  Vancomycin trough level 15-20 mcg/ml  Plan:  - Continue vancomycin 750 mg IV Q12h x at least 4 weeks - Monitor temp, CBC, C&S - Recheck VT next week unless renal function changes significantly  Harolyn Rutherford, PharmD Clinical Pharmacist - Resident Pager: [phone] Pharmacy: [phone] 08/27/2013 11:31 AM

## 2013-08-28 NOTE — Progress Notes (Addendum)
Brief Progress Note  PCCM called to bedside after pt returned from cath lab.  Upon arrival, pt with gasspy/guppy breathing with minimal visible blood seen in ET tube.  A-line placed in left wrist, STAT ABG drawn (7.32/57/67).  Vent adjusted, PEEP increased from 5 to 10, Rate increased to 30.  Lasix 40 push and lasix drip to follow.  Fentanyl and Versed ordered for sedation.  Will continue to monitor.  Thank you for allowing [place] to be a part of Anthony Turner's care.   Montey Hora, PA - [facility] Pgr: [phone]  or [phone]  As above.  Vent changed and cefepime added to cover for pneumonia.  Additional CC time of 45 min.  Patient seen and examined, agree with above note.  I dictated the care and orders written for this patient under my direction.  [name], MD [phone]

## 2013-08-28 NOTE — Progress Notes (Signed)
PULMONARY / CRITICAL CARE MEDICINE  Name: Anthony Turner MRN: [id number] DOB: [birth year]    ADMISSION DATE:  08/24/2013 CONSULTATION DATE:  08/27/2013  REFERRING MD :  Cardiology PRIMARY SERVICE:  Cardiology  CHIEF COMPLAINT: Acute respiratory failure  BRIEF PATIENT DESCRIPTION:  73 yo with remote tobacco use (quit 25 years ago), COPD / asthma?, asbestos exposure, sob for "years" who was treated with Levaquin for 3 days prior to admit for URI admitted 2/12 with dyspnea / chest pain.  Cardiac cath 2/13 revealed moderate to severe AS, significant CAD, moderate pulmonary hypertension and diastolic heart failure.  Developed respiratory distress in 2/19, PCCM was consulted and patient was intubated.  SIGNIFICANT EVENTS / STUDIES:   2/12  Admitted with dyspnea and chest pain 2/13  TTE >>> EF 63-84%, grade 2 diastolic dysfunction 5/36  Cardiac cath >>> moderate to severe AS, significant CAD, moderate pulmonary hypertension and diastolic heart failure 4/68  Chest CT >>> Asbestos telated pleural disease, rounded atelectasis, no evidence of lung asbestosis, posterior upper lobe GGO concerning for BAC  LINES / TUBES: OETT 2/19 >>> OGT 2/19 >>> L IJ CVL 2/19 >>> Foley ??? >>>  CULTURES: 2/12  MRSA >>> neg 2/14  Blood >>> STAPHYLOCOCCUS SPECIES (COAGULASE NEGATIVE) 2/16  Blood >>>  ANTIBIOTICS: Vancomycin  2/18 >>>  INTERVAL HISTORY: No events overnight.  Sedated but withdraws all ext to pain.  VITAL SIGNS: Temp:  [97 F (36.1 C)-98.6 F (37 C)] 98.6 F (37 C) (02/20 0800) Pulse Rate:  [46-77] 62 (02/20 0945) Resp:  [0-18] 18 (02/20 0807) BP: (63-146)/(23-67) 109/34 mmHg (02/20 0945) SpO2:  [96 %-100 %] 99 % (02/20 0945) FiO2 (%):  [40 %-100 %] 50 % (02/20 0841) Weight:  [97.7 kg (215 lb 6.2 oz)] 97.7 kg (215 lb 6.2 oz) (02/20 0500)  HEMODYNAMICS: CVP:  [7 mmHg-13 mmHg] 7 mmHg VENTILATOR SETTINGS: Vent Mode:  [-] PRVC FiO2 (%):  [40 %-100 %] 50 % Set Rate:  [16 bmp] 16 bmp Vt  Set:  [510 mL] 510 mL PEEP:  [5 cmH20] 5 cmH20 Pressure Support:  [5 cmH20] 5 cmH20 Plateau Pressure:  [14 cmH20-23 cmH20] 14 cmH20  INTAKE / OUTPUT: Intake/Output     02/19 0701 - 02/20 0700 02/20 0701 - 02/21 0700   P.O.     I.V. (mL/kg) 1523.8 (15.6) 72 (0.7)   IV Piggyback 1300    Total Intake(mL/kg) 2823.8 (28.9) 72 (0.7)   Urine (mL/kg/hr) 3480 (1.5) 650 (2.1)   Emesis/NG output 280 (0.1)    Total Output 3760 650   Net -936.2 -578.1         PHYSICAL EXAMINATION: General:  Appears chronically ill, sedated and intubated. Neuro: Sedated, intubated but withdraws to pain. HEENT:  PERRL, No LAN/JVD. Cardiovascular:  RRR, 3/6 SEM at LUSB. Lungs: Diffuse crackles. Abdomen:  Soft, nontender, bowel sounds diminished Musculoskeletal:  Moves all extremities, ++lower ext  edema Skin:  Intact, warm  LABS: CBC  Recent Labs Lab 08/25/13 1042 08/27/13 0805 08/14/2013 0455  WBC 15.5* 16.6* 23.7*  HGB 10.2* 9.7* 9.6*  HCT 30.3* 29.4* 28.9*  PLT 191 248 436*   Coag's No results found for this basename: APTT, INR,  in the last 168 hours BMET  Recent Labs Lab 08/25/13 1042 08/27/13 0805 08/31/2013 0455  NA 135* 134* 140  K 3.9 4.2 4.6  CL 95* 93* 97  CO2 25 27 27   BUN 29* 37* 42*  CREATININE 1.23 1.48* 1.42*  GLUCOSE 142* 120* 239*   Electrolytes  Recent Labs Lab 08/25/13 1042 08/27/13 0805 08/30/2013 0455  CALCIUM 8.1* 8.0* 7.9*   Sepsis Markers  Recent Labs Lab 08/27/13 0930 08/27/13 1355 08/27/13 1848 08/23/2013 0048 08/22/2013 0455  LATICACIDVEN  --  1.3 1.2 1.5  --   PROCALCITON 0.26  --   --   --  0.36    ABG  Recent Labs Lab 08/19/2013 1237 08/27/13 0920 08/27/13 1426  PHART 7.396 7.380 7.319*  PCO2ART 42.9 50.3* 56.3*  PO2ART 102.0* 73.0* 291.0*   Liver Enzymes  Recent Labs Lab 08/25/13 1042  AST 19  ALT 6  ALKPHOS 119*  BILITOT 0.4  ALBUMIN 2.8*   Cardiac Enzymes  Recent Labs Lab 08/27/13 0805 08/27/13 1015 08/27/13 1620  08/27/13 2215  TROPONINI  --  <0.30 0.81* 0.67*  PROBNP 2423.0*  --   --   --    Glucose  Recent Labs Lab 08/27/13 0812 08/27/13 1156 08/27/13 1619 08/27/13 2017 08/27/13 2337 08/09/2013 0332  GLUCAP 141* 114* 187* 220* 208* 209*   Dg Chest Port 1 View  08/27/2013   CLINICAL DATA:  Status post left central line placement  EXAM: PORTABLE CHEST - 1 VIEW  COMPARISON:  08/27/2013 804 hrs  FINDINGS: Cardiac shadow remains enlarged. An endotracheal tube is now seen with the tip 5.6 cm above the carina. A new left central venous line is noted with the tip in the proximal superior vena cava. No definitive pneumothorax is seen. A nasogastric catheter extends into the stomach beneath the edge of the film. Diffuse pleural calcifications are again seen bilaterally. Patchy interstitial changes are again identified stable from the prior exam. The linear density is noted overlying the mid left cardiac border stable from the prior exam. This is likely related to the pleural calcifications. No other focal abnormality is seen.  IMPRESSION: Chronic changes in the lungs bilaterally.  Endotracheal tube, nasogastric catheter and left central venous line in satisfactory position. No pneumothorax is noted.   Electronically Signed   By: Inez Catalina M.D.   On: 08/27/2013 11:23   Dg Chest Port 1v Same Day  08/27/2013   CLINICAL DATA:  Shortness of Breath  EXAM: PORTABLE CHEST - 1 VIEW SAME DAY  COMPARISON:  August 23, 2013 chest CT and August 25, 2013 chest radiograph  FINDINGS: There is extensive pleural and diaphragmatic calcification consistent with prior asbestos exposure. There is diffuse interstitial disease bilaterally which may be due to to acute interstitial edema superimposed on chronic interstitial disease, likely due to asbestosis. There is also underlying emphysematous change.  Heart is enlarged. The pulmonary vascularity reflects underlying emphysema.  IMPRESSION: The overall appearance is felt to be  secondary to congestive heart failure superimposed on emphysema and underlying asbestos exposure/ asbestosis. The appearance is stable compared to 2 days prior.   Electronically Signed   By: Lowella Grip M.D.   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 - Begin PS trials but no extubation until more volume negative. - VAP bundle. - Daily SBT. - Trend ABG/CXR. - Albuterol/Atrovent. - Solu-Medrol. - Needs follow up CT to follow GGO in 6-8 wks.  CARDIOVASCULAR A:   Severe CAD NSTEMI? Moderate to severe AS Hypotension, likely  secondary to procedural sedation / positive pressure ventilation P:  - Goal MAP>65. - Cardiology following. - Not a surgical candidate according to TCTS. - Cath lab / PTCA / stent when  Blood cultures clear. - Levophed gtt. - Continue ASA, Zocor, Plavix. - D/ced Nitroglycerin gtt. - Hold Bisoprolol, Imdur. - Trend troponin / lactate.  RENAL A:  No active issues P:   - Trend BMP. - D/c Lasix as hypotensive.  GASTROINTESTINAL A: Nutrition GIPx P:   - Consult nutrition for TF as per nutrition. - Protonix.  HEMATOLOGIC A:   VTE Px P:  - Trend CBC. - Heparin as ordered.  INFECTIOUS A:   Staph bacteremia P:   - ID following. - Cultures and antibiotics as above. - PCT.  ENDOCRINE  A:   DM  P:   - SSI.  NEUROLOGIC A:   Acute encephalopathy P:   - Goal RASS 0 to -1. - D/C Propofol and start precedex. - Fentanyl PRN.  I have personally obtained history, examined patient, evaluated and interpreted laboratory and imaging results, reviewed medical records, formulated assessment / plan and placed  orders.  CRITICAL CARE:  The patient is critically ill with multiple organ systems failure and requires high complexity decision making for assessment and support, frequent evaluation and titration of therapies, application of advanced monitoring technologies and extensive interpretation of multiple databases. Critical Care Time devoted to patient care services described in this note is 35 minutes.   Jennet Maduro, MD Pulmonary and [place] Pager: [phone]  08/15/2013, 10:14 AM

## 2013-08-28 NOTE — Progress Notes (Signed)
[place] for Vancomycin, Add Cefepime  Indication: bacteremia / pneumonia   Allergies  Allergen Reactions  . Sulfa Antibiotics Nausea Only    Patient Measurements: Height: 5\' 6"  (167.6 cm) Weight: 215 lb 6.2 oz (97.7 kg) IBW/kg (Calculated) : 63.8  Vital Signs: Temp: 97.6 F (36.4 C) (02/20 1300) Temp src: Axillary (02/20 1300) BP: 111/48 mmHg (02/20 1445) Pulse Rate: 89 (02/20 1445) Intake/Output from previous day: 02/19 0701 - 02/20 0700 In: 2823.8 [I.V.:1523.8; IV Piggyback:1300] Out: 3760 [Urine:3480; Emesis/NG output:280] Intake/Output from this shift: Total I/O In: 285.9 [I.V.:285.9] Out: 1000 [Urine:1000]  Labs:  Recent Labs  08/27/13 0805 08/19/2013 0455 08/12/2013 1330  WBC 16.6* 23.7* 32.0*  HGB 9.7* 9.6* 10.4*  PLT 248 436* 637*  CREATININE 1.48* 1.42* 1.44*   Estimated Creatinine Clearance: 49.3 ml/min (by C-G formula based on Cr of 1.44).  Recent Labs  08/26/13 1110  [place] 18.1     Microbiology: Recent Results (from the past 720 hour(s))  MRSA PCR SCREENING     Status: None   Collection Time    08/13/2013  4:12 PM      Result Value Ref Range Status   MRSA by PCR NEGATIVE  NEGATIVE Final   Comment:            The GeneXpert MRSA Assay (FDA     approved for NASAL specimens     only), is one component of a     comprehensive MRSA colonization     surveillance program. It is not     intended to diagnose MRSA     infection nor to guide or     monitor treatment for     MRSA infections.  CULTURE, BLOOD (ROUTINE X 2)     Status: None   Collection Time    08/27/2013  3:30 AM      Result Value Ref Range Status   Specimen Description BLOOD LEFT HAND   Final   Special Requests BOTTLES DRAWN AEROBIC AND ANAEROBIC 10CC EACH   Final   Culture  Setup Time     Final   Value: 08/31/2013 08:25     Performed at [facility]   Culture     Final   Value: STAPHYLOCOCCUS SPECIES (COAGULASE NEGATIVE)     Note:  RIFAMPIN AND GENTAMICIN SHOULD NOT BE USED AS SINGLE DRUGS FOR TREATMENT OF STAPH INFECTIONS.     Note: Gram Stain Report Called to,Read Back By and Verified With: CHRIS BUNGQUE ON 08/22/2013 AT 12:49A BY Dennard Nip     Performed at [facility]   Report Status 08/24/2013 FINAL   Final   Organism ID, Bacteria STAPHYLOCOCCUS SPECIES (COAGULASE NEGATIVE)   Final  CULTURE, BLOOD (ROUTINE X 2)     Status: None   Collection Time    08/22/2013  3:36 AM      Result Value Ref Range Status   Specimen Description BLOOD LEFT HAND   Final   Special Requests BOTTLES DRAWN AEROBIC AND ANAEROBIC 10CC EACH   Final   Culture  Setup Time     Final   Value: 08/13/2013 08:25     Performed at [facility]   Culture     Final   Value: STAPHYLOCOCCUS SPECIES (COAGULASE NEGATIVE)     Note: SUSCEPTIBILITIES PERFORMED ON PREVIOUS CULTURE WITHIN THE LAST 5 DAYS.     Note: Gram Stain Report Called to,Read Back By and Verified With: CHRIS BUNGQUE ON 08/22/2013 AT 12:49A BY  WILEJ     Performed at [facility]   Report Status 08/24/2013 FINAL   Final  CULTURE, BLOOD (ROUTINE X 2)     Status: None   Collection Time    08/22/13  2:00 AM      Result Value Ref Range Status   Specimen Description BLOOD RIGHT ARM   Final   Special Requests BOTTLES DRAWN AEROBIC AND ANAEROBIC 10CC   Final   Culture  Setup Time     Final   Value: 08/22/2013 12:10     Performed at [facility]   Culture     Final   Value: STAPHYLOCOCCUS SPECIES (COAGULASE NEGATIVE)     Note: SUSCEPTIBILITIES PERFORMED ON PREVIOUS CULTURE WITHIN THE LAST 5 DAYS.     Note: Gram Stain Report Called to,Read Back By and Verified With: [place] Hodgin RN on 08/23/13 at 02:20 by Rise Mu     Performed at [facility]   Report Status 08/25/2013 FINAL   Final  CULTURE, BLOOD (ROUTINE X 2)     Status: None   Collection Time    08/22/13  2:15 AM      Result Value Ref Range Status   Specimen Description BLOOD LEFT HAND   Final    Special Requests     Final   Value: BOTTLES DRAWN AEROBIC AND ANAEROBIC 10CC AER,5CC ANA   Culture  Setup Time     Final   Value: 08/22/2013 12:10     Performed at [facility]   Culture     Final   Value: STAPHYLOCOCCUS SPECIES (COAGULASE NEGATIVE)     Note: RIFAMPIN AND GENTAMICIN SHOULD NOT BE USED AS SINGLE DRUGS FOR TREATMENT OF STAPH INFECTIONS.     Note: Gram Stain Report Called to,Read Back By and Verified With: Dellie Burns RN on 08/23/13 at 01:25 by Rise Mu     Performed at [facility]   Report Status 08/25/2013 FINAL   Final   Organism ID, Bacteria STAPHYLOCOCCUS SPECIES (COAGULASE NEGATIVE)   Final  CULTURE, BLOOD (ROUTINE X 2)     Status: None   Collection Time    08/24/13 10:05 AM      Result Value Ref Range Status   Specimen Description BLOOD RIGHT ARM   Final   Special Requests BOTTLES DRAWN AEROBIC ONLY 10CC   Final   Culture  Setup Time     Final   Value: 08/24/2013 13:56     Performed at [facility]   Culture     Final   Value:        BLOOD CULTURE RECEIVED NO GROWTH TO DATE CULTURE WILL BE HELD FOR 5 DAYS BEFORE ISSUING A FINAL NEGATIVE REPORT     Performed at [facility]   Report Status PENDING   Incomplete  CULTURE, BLOOD (ROUTINE X 2)     Status: None   Collection Time    08/24/13 12:30 PM      Result Value Ref Range Status   Specimen Description BLOOD LEFT HAND   Final   Special Requests BOTTLES DRAWN AEROBIC ONLY [facility]   Final   Culture  Setup Time     Final   Value: 08/24/2013 16:16     Performed at [facility]   Culture     Final   Value:        BLOOD CULTURE RECEIVED NO GROWTH TO DATE CULTURE WILL BE HELD FOR 5 DAYS BEFORE ISSUING A  FINAL NEGATIVE REPORT     Performed at [facility]   Report Status PENDING   Incomplete    Anti-infectives   Start     Dose/Rate Route Frequency Ordered Stop   08/26/13 1600  vancomycin (VANCOCIN) IVPB 750 mg/150 ml premix     750 mg 150 mL/hr over 60 Minutes  Intravenous Every 12 hours 08/26/13 1458     08/22/13 1600  ceFAZolin (ANCEF) IVPB 2 g/50 mL premix  Status:  Discontinued     2 g 100 mL/hr over 30 Minutes Intravenous 3 times per day 08/22/13 1552 08/24/13 1457   08/22/13 1200  vancomycin (VANCOCIN) IVPB 750 mg/150 ml premix  Status:  Discontinued     750 mg 150 mL/hr over 60 Minutes Intravenous Every 12 hours 08/22/13 0125 08/26/13 1458   08/22/13 0130  vancomycin (VANCOCIN) IVPB 750 mg/150 ml premix     750 mg 150 mL/hr over 60 Minutes Intravenous  Once 08/22/13 0125 08/22/13 0315   08/25/2013 1000  levofloxacin (LEVAQUIN) tablet 500 mg  Status:  Discontinued     500 mg Oral Daily 08/19/2013 1627 08/22/13 1552      Assessment: 73 yo male admitted 2/12 for NSTEMI now with coagulase negative Staph bacteremia started on IV Abx 2/14. Now adding Cefepime for empiric coverage of pneumonia. CXR on 2/20 identified extensive bilateral airway disease. Patient remains afebrile, wbc continue trending up to 32, SCr back up to 1.44. CoNS is methicillin resistant. Vancomycin continues to be at goal, will continue current dosing.  Vancomycin trough 2/16= 15.6.  Vancomycin trough 2/18= 18.1 (slight accumulation from earlier in the week)  2/13 LVQ>>2/14 2/14 Vanc>> 2/14 Cefazolin>>2/16  2/16 BCx2>>ngtd 2/14 BCx2>>CoNS 2/13 BCx2>>CoNS-sen to vanc 2/12 MRSA PCR>>NEG  Goal of Therapy:  Vancomycin trough level 15-20 mcg/ml  Plan:  Continue vancomycin 750 mg IV Q12h Start Cefepime 1 gm IV Q 24 hours due to worsening renal fx Monitor CBC, Cx data Repeat VT tomorrow given worsening renal fx.   Albertina Parr, PharmD.  Clinical Pharmacist Pager [phone]

## 2013-08-28 NOTE — Progress Notes (Signed)
Placed back on full support with same settings due to pt going to cath lab

## 2013-08-28 NOTE — Progress Notes (Signed)
ANTICOAGULATION CONSULT NOTE - Follow Up Consult  Pharmacy Consult for Heparin  Indication: chest pain/ACS, CAD awaiting possible PCI  Allergies  Allergen Reactions  . Sulfa Antibiotics Nausea Only    Patient Measurements: Height: 5\' 6"  (167.6 cm) Weight: 220 lb 10.9 oz (100.1 kg) IBW/kg (Calculated) : 63.8 Heparin Dosing Weight: ~86kg  Vital Signs: Temp: 97.9 F (36.6 C) (02/20 0000) Temp src: Oral (02/20 0000) BP: 145/46 mmHg (02/20 0045) Pulse Rate: 72 (02/20 0045)  Labs:  Recent Labs  08/25/13 1042 08/27/13 0805 08/27/13 1015 08/27/13 1620 08/27/13 2215 08/26/2013 0100  HGB 10.2* 9.7*  --   --   --   --   HCT 30.3* 29.4*  --   --   --   --   PLT 191 248  --   --   --   --   HEPARINUNFRC  --   --   --   --   --  0.73*  CREATININE 1.23 1.48*  --   --   --   --   TROPONINI <0.30  --  <0.30 0.81* 0.67*  --     Estimated Creatinine Clearance: 48.5 ml/min (by C-G formula based on Cr of 1.48).   Medications:  Heparin 2400 units/hr  Assessment: 73 y/o heparin for CAD, possible PCI soon, was restarted on heparin at 2400 units/hr (was therapeutic previously), HL is now 0.73, other labs as above.   Goal of Therapy:  Heparin level 0.3-0.7 units/ml Monitor platelets by anticoagulation protocol: Yes   Plan:  -Decrease heparin to 2250 units/hr -1000 HL -Daily CBC/HL -Monitor for bleeding   Narda Bonds 08/27/2013,1:33 AM

## 2013-08-28 NOTE — Progress Notes (Signed)
Subjective: [name] is a 73 yo man pmh AS, CAD, asbestosis, COPD, p/w NSTEMI on 08/16/2013. LHC mod severe-AS, high grade ostial circumblex lesion, mod-severe LAD, pulm HTN with coag - staph bacteremia not CABG candidate.   Intubated/sedated this AM. VSS.   Objective: Vital signs in last 24 hours: Filed Vitals:   08/23/2013 0500 08/19/2013 0515 08/11/2013 0530 08/27/2013 0600  BP: 111/46 132/46 131/34 124/38  Pulse: 73 73 72 72  Temp:      TempSrc:      Resp:      Height:      Weight: 215 lb 6.2 oz (97.7 kg)     SpO2: 98% 99% 99% 98%   Weight change:   Intake/Output Summary (Last 24 hours) at 08/11/2013 0756 Last data filed at 09/05/2013 0600  Gross per 24 hour  Intake 2823.79 ml  Output   3760 ml  Net -936.21 ml   General: intubated/sedated HEENT: PERRL, EOMI, no scleral icterus Cardiac: RRR, no rubs, harsh 3/6 systolic murmur, no gallops Pulm: rhochus, vented Abd: soft, nontender, nondistended, BS present Ext: warm and well perfused, no pedal edema Neuro: cranial nerves II-XII grossly intact  Lab Results: Basic Metabolic Panel:  Recent Labs Lab 08/25/2013 0915  08/27/13 0805 08/13/2013 0455  NA  --   < > 134* 140  K  --   < > 4.2 4.6  CL  --   < > 93* 97  CO2  --   < > 27 27  GLUCOSE  --   < > 120* 239*  BUN  --   < > 37* 42*  CREATININE  --   < > 1.48* 1.42*  CALCIUM  --   < > 8.0* 7.9*  MG 2.2  --   --   --   < > = values in this interval not displayed.  CBC:  Recent Labs Lab 08/23/13 0300 08/25/13 1042 08/27/13 0805 08/29/2013 0455  WBC 11.7* 15.5* 16.6* 23.7*  NEUTROABS  --  13.1*  --   --   HGB 9.8* 10.2* 9.7* 9.6*  HCT 29.1* 30.3* 29.4* 28.9*  MCV 84.8 84.9 86.0 84.5  PLT 141* 191 248 436*   Cardiac Enzymes:  Recent Labs Lab 08/27/13 1015 08/27/13 1620 08/27/13 2215  TROPONINI <0.30 0.81* 0.67*   BNP:  Recent Labs Lab 08/27/13 0805  PROBNP 2423.0*    CBG:  Recent Labs Lab 08/27/13 0812 08/27/13 1156 08/27/13 1619 08/27/13 2017  08/27/13 2337 08/26/2013 0332  GLUCAP 141* 114* 187* 220* 208* 209*   Cardiac Studies: Echo 10/14: report not available [facility]   Echo 2/15: EF 17-49%, grade 2 diastolic dysfunction, mod AS  Studies/Results: Dg Chest Port 1 View  08/27/2013   CLINICAL DATA:  Status post left central line placement  EXAM: PORTABLE CHEST - 1 VIEW  COMPARISON:  08/27/2013 804 hrs  FINDINGS: Cardiac shadow remains enlarged. An endotracheal tube is now seen with the tip 5.6 cm above the carina. A new left central venous line is noted with the tip in the proximal superior vena cava. No definitive pneumothorax is seen. A nasogastric catheter extends into the stomach beneath the edge of the film. Diffuse pleural calcifications are again seen bilaterally. Patchy interstitial changes are again identified stable from the prior exam. The linear density is noted overlying the mid left cardiac border stable from the prior exam. This is likely related to the pleural calcifications. No other focal abnormality is seen.  IMPRESSION: Chronic changes in the lungs bilaterally.  Endotracheal tube, nasogastric catheter and left central venous line in satisfactory position. No pneumothorax is noted.   Electronically Signed   By: Inez Catalina M.D.   On: 08/27/2013 11:23   Dg Chest Port 1v Same Day  08/27/2013   CLINICAL DATA:  Shortness of Breath  EXAM: PORTABLE CHEST - 1 VIEW SAME DAY  COMPARISON:  August 23, 2013 chest CT and August 25, 2013 chest radiograph  FINDINGS: There is extensive pleural and diaphragmatic calcification consistent with prior asbestos exposure. There is diffuse interstitial disease bilaterally which may be due to to acute interstitial edema superimposed on chronic interstitial disease, likely due to asbestosis. There is also underlying emphysematous change.  Heart is enlarged. The pulmonary vascularity reflects underlying emphysema.  IMPRESSION: The overall appearance is felt to be secondary to congestive heart failure  superimposed on emphysema and underlying asbestos exposure/ asbestosis. The appearance is stable compared to 2 days prior.   Electronically Signed   By: Lowella Grip M.D.   On: 08/27/2013 08:13   Medications: I have reviewed the patient's current medications. Scheduled Meds: . antiseptic oral rinse  15 mL Mouth Rinse QID  . aspirin EC  81 mg Oral Daily  . budesonide-formoterol  2 puff Inhalation BID  . chlorhexidine  15 mL Mouth/Throat BID  . clopidogrel  75 mg Oral Q breakfast  . docusate sodium  100 mg Oral Daily  . furosemide  40 mg Intravenous BID  . insulin aspart  0-9 Units Subcutaneous Q4H  . levalbuterol  0.63 mg Nebulization Q6H  . methylPREDNISolone (SOLU-MEDROL) injection  60 mg Intravenous 3 times per day  . nystatin  5 mL Oral QID  . pantoprazole  40 mg Oral Daily  . polyethylene glycol  17 g Oral Daily  . simvastatin  40 mg Oral q1800  . tiotropium  18 mcg Inhalation Daily  . vancomycin  750 mg Intravenous Q12H   Continuous Infusions: . heparin 2,250 Units/hr (08/25/2013 0136)  . norepinephrine (LEVOPHED) Adult infusion 14 mcg/min (08/31/2013 0645)  . propofol 40 mcg/kg/min (09/03/2013 0341)   PRN Meds:.sodium chloride, fentaNYL, levalbuterol, magnesium hydroxide, nitroGLYCERIN, ondansetron (ZOFRAN) IV, sodium phosphate Assessment/Plan: #VDRF: pt intubated yesterday for respiratory failure in setting of COPD and asbestosis. management per PCCM. Pt being treated for possible COPD exacerbation.  #hypotension: in setting of intubation and NSTEMI. trops flat trend. Pt continues on levo gtt (origionally 44mcg>>13).   # NSTEMI. 2 vessel obstructive CAD by 08/10/2013 LHC with severe ostial LCx stenosis and moderate disease in the mid LAD. He is not a good surgical candidate. The coronary lesions are suitable but high risk for PCI.  Pt P2Y12 297 indicating poor response to plavix. Pt did have trop elevation overnight (peak 0.81) from previously normal.  -d/c plavix -LHC with PCI  08/30/2013 extensive education with risk of procedure given to family -Brilinta load and then continue BID on 08/29/13  # Coagulase negative staph bacteremia. MRSA. Cultures from 08/24/13 are negative to date. Continue Vancomycin per ID. Cannot place PICC line until bacteremia clears. Patient is at increased risk for endocarditis of the AV. Plan is to complete 6 week course of antibiotics. If unable to clear bacteremia may need to consider TEE but currently this will not alter therapy.   #Acute renal insufficiency. Baseline creatinine 1.0. Now 1.8>>1.5>>1.45>>1.23>>1.4.  # Moderate aortic stenosis.  # COPD/asbestosis.  # Hyperlipidemia. On statin Rx.  # Bladder obstruction. Now with foley in place. On flomax. Consider [place] foley once able to be up  more.    # DM type 2.    LOS: 8 days   Clinton Gallant, MD 08/11/2013, 7:56 AM Patient seen and examined and history reviewed. Agree with above findings and plan. Patient stable overnight on vent. Still on Neo IV for pressure support but weaning. Renal function is stable. With events of yesterday patient did bump his troponins c/w NSTEMI. I think now is the window of opportunity for treating his CAD with stenting of the ostial LCx. Patient is a nonresponder to Plavix. Will load with Brilinta. Still needs diuresis. Continue Vent support per CCM.  [name] [facility] 08/14/2013 10:50 AM

## 2013-08-28 NOTE — Progress Notes (Signed)
INITIAL NUTRITION ASSESSMENT  DOCUMENTATION CODES Per approved criteria  -Obesity Unspecified   INTERVENTION: Initiate Vital High Protein via tube @ 15 ml/hr and increase by 10 ml every 4 hours to goal rate of 55 ml/hr and 30 ml Prostat daily to provide 1420 kcals, 131 grams of protein, and 1104 ml of free water.  NUTRITION DIAGNOSIS: Inadequate oral intake related to inability to eat as evidenced by NPO.   Goal: Enteral nutrition to provide 60-70% of estimated calorie needs (22-25 kcals/kg ideal body weight) and 100% of estimated protein needs, based on ASPEN guidelines for permissive underfeeding in critically ill obese individuals.  Monitor:  Vent status, weight trends, labs, I/O's, TF rate and tolerance  Reason for Assessment: MD consult for TF initiation and management.  73 y.o. male  Admitting Dx: Bacteremia due to Staphylococcus  ASSESSMENT: Pt with PMH significant for CAD and moderate-severe calcific aortic stenosis, NSTEMI, COPD and asbestosis, diabetes mellitus, hyperlipidemia, and hypertension. Pt with dx of bacteremia due to staphylococcus. Pt intubated 2/19 for respiratory failure.  2/13 Procedure:LEFT AND RIGHT HEART CATHETERIZATION WITH CORONARY ANGIOGRAM (N/A)  2/20-Procedure: PTCA and stenting of the Ostial LCx  Pt is intubated on ventilator support.  MV: 12.7 L/Min Temp (24hrs), Avg:98.1 F (36.7 C), Min:97.8 F (36.6 C), Max:98.6 F (37 C)  Unable to obtain nutrition hx due to pt's current state. No findings of significant fat and muscle mass depletion.  Nutrition Focused Physical Exam:  Subcutaneous Fat:  Orbital Region: N/A Upper Arm Region: WNL Thoracic and Lumbar Region: WNL  Muscle:  Temple Region: WNL Clavicle Bone Region: WNL Clavicle and Acromion Bone Region: WNL Scapular Bone Region: N/A Dorsal Hand: WNL Patellar Region: WNL Anterior Thigh Region: WNL Posterior Calf Region: WNL  Edema: generalized   Height: Ht Readings from Last 1  Encounters:  08/27/13 5\' 6"  (1.676 m)    Weight: Wt Readings from Last 1 Encounters:  09/04/2013 215 lb 6.2 oz (97.7 kg)    Ideal Body Weight: 142 lb  % Ideal Body Weight: 151%  Wt Readings from Last 10 Encounters:  08/17/2013 215 lb 6.2 oz (97.7 kg)  08/25/2013 215 lb 6.2 oz (97.7 kg)  08/15/2013 215 lb 6.2 oz (97.7 kg)  08/18/13 214 lb (97.07 kg)  07/31/13 215 lb 12 oz (97.864 kg)  06/30/13 217 lb 4 oz (98.544 kg)  04/21/13 215 lb (97.523 kg)  02/22/12 211 lb (95.709 kg)    Usual Body Weight: 215 lb  % Usual Body Weight: 100%  BMI:  Body mass index is 34.78 kg/(m^2). Class I obesity  Estimated Nutritional Needs: Kcal: 2056 (underfeeding goal: [id number]) Protein: 130-140 grams Fluid: >1.5 L/day  Skin: generalized edema  Diet Order: NPO  EDUCATION NEEDS: -Education not appropriate at this time   Intake/Output Summary (Last 24 hours) at 08/15/2013 1224 Last data filed at 09/03/2013 1100  Gross per 24 hour  Intake 2016.67 ml  Output   4060 ml  Net -2043.33 ml    Last BM: 2/18   Labs:   Recent Labs Lab 08/25/13 1042 08/27/13 0805 09/03/2013 0455  NA 135* 134* 140  K 3.9 4.2 4.6  CL 95* 93* 97  CO2 25 27 27   BUN 29* 37* 42*  CREATININE 1.23 1.48* 1.42*  CALCIUM 8.1* 8.0* 7.9*  GLUCOSE 142* 120* 239*    CBG (last 3)   Recent Labs  08/27/13 2017 08/27/13 2337 08/15/2013 0332  GLUCAP 220* 208* 209*    Scheduled Meds: . antiseptic oral rinse  15  mL Mouth Rinse QID  . aspirin  81 mg Oral Daily  . budesonide-formoterol  2 puff Inhalation BID  . chlorhexidine  15 mL Mouth/Throat BID  . docusate  100 mg Oral Daily  . furosemide  40 mg Intravenous BID  . insulin aspart  0-9 Units Subcutaneous Q4H  . levalbuterol  0.63 mg Nebulization Q6H  . methylPREDNISolone (SOLU-MEDROL) injection  60 mg Intravenous 3 times per day  . nystatin  5 mL Oral QID  . pantoprazole sodium  40 mg Per Tube Daily  . polyethylene glycol  17 g Oral Daily  . simvastatin  40 mg  Oral q1800  . Ticagrelor  180 mg Oral Once  . [START ON 08/29/2013] Ticagrelor  90 mg Oral BID  . tiotropium  18 mcg Inhalation Daily  . vancomycin  750 mg Intravenous Q12H    Continuous Infusions: . dexmedetomidine 1.1 mcg/kg/hr (08/19/2013 1105)  . norepinephrine (LEVOPHED) Adult infusion 14 mcg/min (08/23/2013 0950)    Past Medical History  Diagnosis Date  . COPD (chronic obstructive pulmonary disease)   . Type 2 diabetes mellitus, controlled   . Asthma   . Arthritis     with DIP nodules  . History of diverticulitis of colon   . Seasonal allergic rhinitis   . Aortic stenosis     mod by last echo (04/2013) with EF 55%  . HTN (hypertension)   . HLD (hyperlipidemia)   . 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 Asbestos exposure     Past Surgical History  Procedure Laterality Date  . Appendectomy  ? 1956  . Tonsillectomy  ? 1944  . Cataract extraction Bilateral 2012    [facility] Dietetic Intern Pager: [phone]

## 2013-08-29 ENCOUNTER — Inpatient Hospital Stay (HOSPITAL_COMMUNITY): Payer: Medicare Other

## 2013-08-29 DIAGNOSIS — J61 Pneumoconiosis due to asbestos and other mineral fibers: Secondary | ICD-10-CM

## 2013-08-29 LAB — CBC
HCT: 32.5 % — ABNORMAL LOW (ref 39.0–52.0)
HEMOGLOBIN: 10.5 g/dL — AB (ref 13.0–17.0)
MCH: 28.2 pg (ref 26.0–34.0)
MCHC: 32.3 g/dL (ref 30.0–36.0)
MCV: 87.1 fL (ref 78.0–100.0)
Platelets: 639 10*3/uL — ABNORMAL HIGH (ref 150–400)
RBC: 3.73 MIL/uL — ABNORMAL LOW (ref 4.22–5.81)
RDW: 15.3 % (ref 11.5–15.5)
WBC: 37.8 10*3/uL — ABNORMAL HIGH (ref 4.0–10.5)

## 2013-08-29 LAB — GLUCOSE, CAPILLARY
GLUCOSE-CAPILLARY: 169 mg/dL — AB (ref 70–99)
GLUCOSE-CAPILLARY: 170 mg/dL — AB (ref 70–99)
GLUCOSE-CAPILLARY: 174 mg/dL — AB (ref 70–99)
GLUCOSE-CAPILLARY: 177 mg/dL — AB (ref 70–99)
GLUCOSE-CAPILLARY: 189 mg/dL — AB (ref 70–99)
GLUCOSE-CAPILLARY: 230 mg/dL — AB (ref 70–99)
Glucose-Capillary: 185 mg/dL — ABNORMAL HIGH (ref 70–99)
Glucose-Capillary: 220 mg/dL — ABNORMAL HIGH (ref 70–99)
Glucose-Capillary: 220 mg/dL — ABNORMAL HIGH (ref 70–99)
Glucose-Capillary: 228 mg/dL — ABNORMAL HIGH (ref 70–99)
Glucose-Capillary: 244 mg/dL — ABNORMAL HIGH (ref 70–99)
Glucose-Capillary: 166 mg/dL — ABNORMAL HIGH (ref 70–99)
Glucose-Capillary: 179 mg/dL — ABNORMAL HIGH (ref 70–99)
Glucose-Capillary: 190 mg/dL — ABNORMAL HIGH (ref 70–99)
Glucose-Capillary: 194 mg/dL — ABNORMAL HIGH (ref 70–99)
Glucose-Capillary: 171 mg/dL — ABNORMAL HIGH (ref 70–99)
Glucose-Capillary: 181 mg/dL — ABNORMAL HIGH (ref 70–99)
Glucose-Capillary: 193 mg/dL — ABNORMAL HIGH (ref 70–99)
Glucose-Capillary: 146 mg/dL — ABNORMAL HIGH (ref 70–99)
Glucose-Capillary: 178 mg/dL — ABNORMAL HIGH (ref 70–99)
Glucose-Capillary: 152 mg/dL — ABNORMAL HIGH (ref 70–99)
Glucose-Capillary: 166 mg/dL — ABNORMAL HIGH (ref 70–99)
Glucose-Capillary: 178 mg/dL — ABNORMAL HIGH (ref 70–99)
Glucose-Capillary: 162 mg/dL — ABNORMAL HIGH (ref 70–99)

## 2013-08-29 LAB — POCT I-STAT 3, ART BLOOD GAS (G3+)
Acid-Base Excess: 2 mmol/L (ref 0.0–2.0)
Bicarbonate: 27.2 mEq/L — ABNORMAL HIGH (ref 20.0–24.0)
O2 Saturation: 98 %
PH ART: 7.4 (ref 7.350–7.450)
TCO2: 29 mmol/L (ref 0–100)
pCO2 arterial: 43.8 mmHg (ref 35.0–45.0)
pO2, Arterial: 103 mmHg — ABNORMAL HIGH (ref 80.0–100.0)

## 2013-08-29 LAB — BASIC METABOLIC PANEL
BUN: 56 mg/dL — ABNORMAL HIGH (ref 6–23)
CO2: 25 mEq/L (ref 19–32)
Calcium: 7.8 mg/dL — ABNORMAL LOW (ref 8.4–10.5)
Chloride: 101 mEq/L (ref 96–112)
Creatinine, Ser: 1.55 mg/dL — ABNORMAL HIGH (ref 0.50–1.35)
GFR, EST AFRICAN AMERICAN: 49 mL/min — AB (ref >90)
GFR, EST NON AFRICAN AMERICAN: 42 mL/min — AB (ref >90)
Glucose, Bld: 198 mg/dL — ABNORMAL HIGH (ref 70–99)
POTASSIUM: 4.4 meq/L (ref 3.7–5.3)
SODIUM: 142 meq/L (ref 137–147)

## 2013-08-29 LAB — PHOSPHORUS: PHOSPHORUS: 4.4 mg/dL (ref 2.3–4.6)

## 2013-08-29 LAB — VANCOMYCIN, TROUGH: Vancomycin Tr: 24.2 ug/mL — ABNORMAL HIGH (ref 10.0–20.0)

## 2013-08-29 LAB — PROCALCITONIN: PROCALCITONIN: 1.04 ng/mL

## 2013-08-29 LAB — MAGNESIUM: Magnesium: 3.4 mg/dL — ABNORMAL HIGH (ref 1.5–2.5)

## 2013-08-29 MED ORDER — RISPERIDONE 1 MG PO TABS
1.0000 mg | ORAL_TABLET | Freq: Two times a day (BID) | ORAL | Status: DC
  Administered 2013-08-29 – 2013-08-30 (×3): 1 mg via ORAL
  Filled 2013-08-29 (×4): qty 1

## 2013-08-29 MED ORDER — VANCOMYCIN HCL IN DEXTROSE 1-5 GM/200ML-% IV SOLN
1000.0000 mg | INTRAVENOUS | Status: DC
  Administered 2013-08-29 – 2013-08-31 (×3): 1000 mg via INTRAVENOUS
  Filled 2013-08-29 (×4): qty 200

## 2013-08-29 MED ORDER — FUROSEMIDE 10 MG/ML IJ SOLN
40.0000 mg | Freq: Two times a day (BID) | INTRAMUSCULAR | Status: DC
  Administered 2013-08-29 – 2013-08-30 (×3): 40 mg via INTRAVENOUS
  Filled 2013-08-29 (×5): qty 4

## 2013-08-29 MED ORDER — SODIUM CHLORIDE 0.9 % IV SOLN
1.0000 mg/h | INTRAVENOUS | Status: DC
  Administered 2013-08-29: 3 mg/h via INTRAVENOUS
  Administered 2013-08-31: 2 mg/h via INTRAVENOUS
  Filled 2013-08-29 (×3): qty 10

## 2013-08-29 MED ORDER — SODIUM CHLORIDE 0.9 % IV SOLN
0.0300 [IU]/min | INTRAVENOUS | Status: DC
  Administered 2013-08-29 – 2013-08-31 (×3): 0.03 [IU]/min via INTRAVENOUS
  Filled 2013-08-29 (×3): qty 2.5

## 2013-08-29 NOTE — Progress Notes (Signed)
ANTIBIOTIC CONSULT NOTE  Pharmacy Consult for Vancomycin Indication: bacteremia  Allergies  Allergen Reactions  . Sulfa Antibiotics Nausea Only    Patient Measurements: Height: 5\' 6"  (167.6 cm) Weight: 214 lb 11.7 oz (97.4 kg) IBW/kg (Calculated) : 63.8  Vital Signs: Temp: 98.4 F (36.9 C) (02/21 0300) Temp src: Oral (02/21 0300) BP: 117/48 mmHg (02/21 0328) Pulse Rate: 72 (02/21 0328) Intake/Output from previous day: 02/20 0701 - 02/21 0700 In: 2245.4 [I.V.:1817.4; NG/GT:228; IV Piggyback:200] Out: 1885 [Urine:1885] Intake/Output from this shift: Total I/O In: 869.2 [I.V.:669.2; NG/GT:200] Out: 560 [Urine:560]  Labs:  Recent Labs  08/27/13 0805 09/04/2013 0455 09/05/2013 1330  WBC 16.6* 23.7* 32.0*  HGB 9.7* 9.6* 10.4*  PLT 248 436* 637*  CREATININE 1.48* 1.42* 1.44*   Estimated Creatinine Clearance: 49.1 ml/min (by C-G formula based on Cr of 1.44).  Recent Labs  08/26/13 1110 08/29/13 0309  VANCOTROUGH 18.1 24.2*     Microbiology: Recent Results (from the past 720 hour(s))  MRSA PCR SCREENING     Status: None   Collection Time    08/31/2013  4:12 PM      Result Value Ref Range Status   MRSA by PCR NEGATIVE  NEGATIVE Final   Comment:            The GeneXpert MRSA Assay (FDA     approved for NASAL specimens     only), is one component of a     comprehensive MRSA colonization     surveillance program. It is not     intended to diagnose MRSA     infection nor to guide or     monitor treatment for     MRSA infections.  CULTURE, BLOOD (ROUTINE X 2)     Status: None   Collection Time    08/13/2013  3:30 AM      Result Value Ref Range Status   Specimen Description BLOOD LEFT HAND   Final   Special Requests BOTTLES DRAWN AEROBIC AND ANAEROBIC 10CC EACH   Final   Culture  Setup Time     Final   Value: 08/11/2013 08:25     Performed at [facility]   Culture     Final   Value: STAPHYLOCOCCUS SPECIES (COAGULASE NEGATIVE)     Note: RIFAMPIN AND  GENTAMICIN SHOULD NOT BE USED AS SINGLE DRUGS FOR TREATMENT OF STAPH INFECTIONS.     Note: Gram Stain Report Called to,Read Back By and Verified With: CHRIS BUNGQUE ON 08/22/2013 AT 12:49A BY Dennard Nip     Performed at [facility]   Report Status 08/24/2013 FINAL   Final   Organism ID, Bacteria STAPHYLOCOCCUS SPECIES (COAGULASE NEGATIVE)   Final  CULTURE, BLOOD (ROUTINE X 2)     Status: None   Collection Time    08/26/2013  3:36 AM      Result Value Ref Range Status   Specimen Description BLOOD LEFT HAND   Final   Special Requests BOTTLES DRAWN AEROBIC AND ANAEROBIC 10CC EACH   Final   Culture  Setup Time     Final   Value: 08/23/2013 08:25     Performed at [facility]   Culture     Final   Value: STAPHYLOCOCCUS SPECIES (COAGULASE NEGATIVE)     Note: SUSCEPTIBILITIES PERFORMED ON PREVIOUS CULTURE WITHIN THE LAST 5 DAYS.     Note: Gram Stain Report Called to,Read Back By and Verified With: CHRIS BUNGQUE ON 08/22/2013 AT 12:49A BY Dennard Nip  Performed at [facility]   Report Status 08/24/2013 FINAL   Final  CULTURE, BLOOD (ROUTINE X 2)     Status: None   Collection Time    08/22/13  2:00 AM      Result Value Ref Range Status   Specimen Description BLOOD RIGHT ARM   Final   Special Requests BOTTLES DRAWN AEROBIC AND ANAEROBIC 10CC   Final   Culture  Setup Time     Final   Value: 08/22/2013 12:10     Performed at [facility]   Culture     Final   Value: STAPHYLOCOCCUS SPECIES (COAGULASE NEGATIVE)     Note: SUSCEPTIBILITIES PERFORMED ON PREVIOUS CULTURE WITHIN THE LAST 5 DAYS.     Note: Gram Stain Report Called to,Read Back By and Verified With: [place] Hodgin RN on 08/23/13 at 02:20 by Rise Mu     Performed at [facility]   Report Status 08/25/2013 FINAL   Final  CULTURE, BLOOD (ROUTINE X 2)     Status: None   Collection Time    08/22/13  2:15 AM      Result Value Ref Range Status   Specimen Description BLOOD LEFT HAND   Final   Special  Requests     Final   Value: BOTTLES DRAWN AEROBIC AND ANAEROBIC 10CC AER,5CC ANA   Culture  Setup Time     Final   Value: 08/22/2013 12:10     Performed at [facility]   Culture     Final   Value: STAPHYLOCOCCUS SPECIES (COAGULASE NEGATIVE)     Note: RIFAMPIN AND GENTAMICIN SHOULD NOT BE USED AS SINGLE DRUGS FOR TREATMENT OF STAPH INFECTIONS.     Note: Gram Stain Report Called to,Read Back By and Verified With: Dellie Burns RN on 08/23/13 at 01:25 by Rise Mu     Performed at [facility]   Report Status 08/25/2013 FINAL   Final   Organism ID, Bacteria STAPHYLOCOCCUS SPECIES (COAGULASE NEGATIVE)   Final  CULTURE, BLOOD (ROUTINE X 2)     Status: None   Collection Time    08/24/13 10:05 AM      Result Value Ref Range Status   Specimen Description BLOOD RIGHT ARM   Final   Special Requests BOTTLES DRAWN AEROBIC ONLY 10CC   Final   Culture  Setup Time     Final   Value: 08/24/2013 13:56     Performed at [facility]   Culture     Final   Value:        BLOOD CULTURE RECEIVED NO GROWTH TO DATE CULTURE WILL BE HELD FOR 5 DAYS BEFORE ISSUING A FINAL NEGATIVE REPORT     Performed at [facility]   Report Status PENDING   Incomplete  CULTURE, BLOOD (ROUTINE X 2)     Status: None   Collection Time    08/24/13 12:30 PM      Result Value Ref Range Status   Specimen Description BLOOD LEFT HAND   Final   Special Requests BOTTLES DRAWN AEROBIC ONLY [facility]   Final   Culture  Setup Time     Final   Value: 08/24/2013 16:16     Performed at [facility]   Culture     Final   Value:        BLOOD CULTURE RECEIVED NO GROWTH TO DATE CULTURE WILL BE HELD FOR 5 DAYS BEFORE ISSUING A FINAL NEGATIVE REPORT  Performed at [facility]   Report Status PENDING   Incomplete   Assessment: 73 yo male MR CNS bacteremia for vancomycin (Day #8)  Goal of Therapy:  Vancomycin trough level 15-20 mcg/ml  Plan:  Change vancomycin 1 g IV q24h F/U renal  function  Phillis Knack, PharmD, BCPS

## 2013-08-29 NOTE — Progress Notes (Signed)
PULMONARY / CRITICAL CARE MEDICINE  Name: Anthony Turner MRN: [id number] DOB: 03-16-1941    ADMISSION DATE:  08/18/2013 CONSULTATION DATE:  08/27/2013  REFERRING MD :  Cardiology PRIMARY SERVICE:  Cardiology  CHIEF COMPLAINT: Acute respiratory failure  BRIEF PATIENT DESCRIPTION:  73 y/o with remote tobacco use (quit 25 years ago), COPD / asthma?, asbestos exposure, sob for "years" who was treated with Levaquin for 3 days prior to admit for URI admitted 2/12 with dyspnea / chest pain.  Cardiac cath 2/13 revealed moderate to severe AS, significant CAD, moderate pulmonary hypertension and diastolic heart failure.  Developed respiratory distress in 2/19, PCCM was consulted and patient was intubated.  SIGNIFICANT EVENTS / STUDIES:  2/12 - Admitted with dyspnea and chest pain 2/13 - TTE >>> EF 22-02%, grade 2 diastolic dysfunction 5/42 - Cardiac cath >>> moderate to severe AS, significant CAD, moderate pulmonary hypertension and diastolic heart failure 7/06 - Chest CT >>> Asbestos telated pleural disease, rounded atelectasis, no evidence of lung asbestosis, posterior upper lobe GGO concerning for BAC  LINES / TUBES: OETT 2/19 >>> OGT 2/19 >>> L IJ CVL 2/19 >>> Foley ? >>>  CULTURES: 2/12  MRSA >>> neg 2/14  Blood >>> Staph Species (coag neg 2/2) 2/16  Blood >>>  ANTIBIOTICS: Vancomycin  2/18 >>> Cefepime 2/20>>>  INTERVAL HISTORY: Remains on levo @20  mcg, lasix @5  mg, Fent @100 , versed at 6 mg/hr.  No acute events.    VITAL SIGNS: Temp:  [97.6 F (36.4 C)-98.7 F (37.1 C)] 98.4 F (36.9 C) (02/21 0400) Pulse Rate:  [30-93] 77 (02/21 0800) Resp:  [0-30] 16 (02/21 0800) BP: (92-145)/(32-68) 117/48 mmHg (02/21 0328) SpO2:  [89 %-100 %] 98 % (02/21 0800) Arterial Line BP: (108-149)/(44-70) 120/49 mmHg (02/21 0800) FiO2 (%):  [40 %-100 %] 60 % (02/21 0734) Weight:  [214 lb 11.7 oz (97.4 kg)] 214 lb 11.7 oz (97.4 kg) (02/21 0354)  HEMODYNAMICS: CVP:  [8 mmHg-11 mmHg] 10  mmHg VENTILATOR SETTINGS: Vent Mode:  [-] PRVC FiO2 (%):  [40 %-100 %] 60 % Set Rate:  [16 bmp-30 bmp] 30 bmp Vt Set:  [510 mL] 510 mL PEEP:  [5 cmH20-10 cmH20] 10 cmH20 Plateau Pressure:  [28 cmH20-34 cmH20] 30 cmH20  INTAKE / OUTPUT: Intake/Output     02/20 0701 - 02/21 0700 02/21 0701 - 02/22 0700   I.V. (mL/kg) 2064.9 (21.2) 59.6 (0.6)   NG/GT 358 15   IV Piggyback 200 200   Total Intake(mL/kg) 2622.9 (26.9) 274.6 (2.8)   Urine (mL/kg/hr) 2035 (0.9) 125 (1)   Emesis/NG output     Total Output 2035 125   Net +587.9 +149.6         PHYSICAL EXAMINATION: General:  Appears chronically ill, sedated and intubated. Neuro: Sedated, intubated but withdraws to pain. HEENT:  PERRL, No LAN/JVD. Cardiovascular:  RRR, 3/6 SEM at LUSB. Lungs: even/non-labored on vent, lungs bilaterally coarse, few lower crackles Abdomen:  Soft, nontender, bowel sounds diminished Musculoskeletal:  Moves all extremities, +lower ext edema Skin:  Intact, warm  LABS: CBC  Recent Labs Lab 08/17/2013 0455 08/09/2013 1330 08/29/13 0405  WBC 23.7* 32.0* 37.8*  HGB 9.6* 10.4* 10.5*  HCT 28.9* 31.2* 32.5*  PLT 436* 637* 639*   BMET  Recent Labs Lab 08/27/13 0805 08/29/2013 0455 08/29/2013 1330 08/29/13 0405  NA 134* 140  --  142  K 4.2 4.6  --  4.4  CL 93* 97  --  101  CO2 27 27  --  25  BUN 37* 42*  --  56*  CREATININE 1.48* 1.42* 1.44* 1.55*  GLUCOSE 120* 239*  --  198*   Electrolytes  Recent Labs Lab 08/27/13 0805 08/19/2013 0455 08/29/13 0405  CALCIUM 8.0* 7.9* 7.8*  MG  --   --  3.4*  PHOS  --   --  4.4   Sepsis Markers  Recent Labs Lab 08/27/13 0930 08/27/13 1355 08/27/13 1848 09/05/2013 0048 08/26/2013 0455 08/29/13 0405  LATICACIDVEN  --  1.3 1.2 1.5  --   --   PROCALCITON 0.26  --   --   --  0.36 1.04   ABG  Recent Labs Lab 09/05/2013 1526 08/15/2013 1709 08/29/13 0341  PHART 7.323* 7.378 7.400  PCO2ART 53.6* 46.6* 43.8  PO2ART 67.0* 88.0 103.0*   Liver  Enzymes  Recent Labs Lab 08/25/13 1042  AST 19  ALT 6  ALKPHOS 119*  BILITOT 0.4  ALBUMIN 2.8*   Cardiac Enzymes  Recent Labs Lab 08/27/13 0805 08/27/13 1015 08/27/13 1620 08/27/13 2215  TROPONINI  --  <0.30 0.81* 0.67*  PROBNP 2423.0*  --   --   --    Glucose  Recent Labs Lab 08/30/2013 2358 08/29/13 0056 08/29/13 0203 08/29/13 0257 08/29/13 0353 08/29/13 0500  GLUCAP 185* 189* 179* 194* 190* 166*   Dg Chest Port 1 View  08/29/2013   CLINICAL DATA:  Endotracheal tube placement.  EXAM: PORTABLE CHEST - 1 VIEW  COMPARISON:  08/22/2013  FINDINGS: Endotracheal tube is unchanged with tip at the inferior margin of the clavicular heads, well above the carina. Left jugular central venous catheter is unchanged with tip overlying the mid upper SVC enteric tube courses into the left upper abdomen with tip not imaged. Cardiomediastinal silhouette is grossly unchanged. Extensive calcified pleural plaques are again seen. Lung volumes are slightly smaller than on the prior study. Extensive bilateral airspace disease overall does not appear significantly changed. No definite pleural effusion or pneumothorax is identified.  IMPRESSION: Unchanged appearance of support lines and tubes. No significant interval change in diffuse bilateral lung opacities.   Electronically Signed   By: Logan Bores   On: 08/29/2013 07:32   Dg Chest Port 1 View  08/12/2013   CLINICAL DATA:  Blood in endotracheal tube.  EXAM: PORTABLE CHEST - 1 VIEW  COMPARISON:  DG CHEST 1V PORT dated 08/19/2013; DG CHEST 1V PORT dated 08/27/2013; CT CHEST W/O CM dated 08/23/2013  FINDINGS: Endotracheal tube tip is approximately 3 cm above the carina. Central line and gastric decompression to shows stable positioning. Extensive bilateral airspace disease and edema again identified. Heavily calcified bilateral pleural plaques are also again visualized. The heart size is stable. No pneumothorax or significant pleural fluid identified.   IMPRESSION: Stable diffuse pulmonary edema and bilateral airspace disease.   Electronically Signed   By: Aletta Edouard M.D.   On: 08/26/2013 15:35   Dg Chest Port 1 View  08/28/2013   CLINICAL DATA:  Intubation, history of COPD  EXAM: PORTABLE CHEST - 1 VIEW  COMPARISON:  Portable chest x-ray of 08/27/2013  FINDINGS: The tip of the endotracheal tube is approximately 5.2 cm above the carina. Diffuse airspace disease is stable. The does appear to be calcification of the hemidiaphragms with some calcified pleural plaques indicating asbestos related disease. Mild cardiomegaly is stable. Left IJ central venous line tip is seen to the mid proximal SVC  IMPRESSION: 1. No change in diffuse airspace disease. 2. Tip of endotracheal tube approximately 5.2 cm above the carinal.  Electronically Signed   By: Ivar Drape M.D.   On: 08/14/2013 10:45   Dg Chest Port 1 View  08/27/2013   CLINICAL DATA:  Status post left central line placement  EXAM: PORTABLE CHEST - 1 VIEW  COMPARISON:  08/27/2013 804 hrs  FINDINGS: Cardiac shadow remains enlarged. An endotracheal tube is now seen with the tip 5.6 cm above the carina. A new left central venous line is noted with the tip in the proximal superior vena cava. No definitive pneumothorax is seen. A nasogastric catheter extends into the stomach beneath the edge of the film. Diffuse pleural calcifications are again seen bilaterally. Patchy interstitial changes are again identified stable from the prior exam. The linear density is noted overlying the mid left cardiac border stable from the prior exam. This is likely related to the pleural calcifications. No other focal abnormality is seen.  IMPRESSION: Chronic changes in the lungs bilaterally.  Endotracheal tube, nasogastric catheter and left central venous line in satisfactory position. No pneumothorax is noted.   Electronically Signed   By: Inez Catalina M.D.   On: 08/27/2013 11:23   Dg Abd Portable 1v  08/26/2013   CLINICAL  DATA:  Blood in enteric tube  EXAM: PORTABLE ABDOMEN - 1 VIEW  COMPARISON:  DG ABD 1 VIEW dated 08/25/2013; CT ABD/PELV WO CM dated 08/30/2013; DG CHEST 1V PORT dated 08/27/2013; CT CHEST W/O CM dated 08/23/2013; DG CHEST 2 VIEW dated 08/31/2013  FINDINGS: Enteric tube tip and side port overlie the expected location of the descending duodenum.  Paucity of bowel gas without definite evidence of obstruction. No supine evidence of pneumoperitoneum. No definite pneumatosis or portal venous gas.  Limited visualization of lower thorax demonstrates extensive bilateral pleural calcifications and heterogeneous airspace opacities within image bilateral lung bases.  IMPRESSION: 1. Enteric tube tip and side port projected over the expected location of the descending duodenum. 2. Nonobstructive bowel gas pattern. 3. Sequela prior asbestos exposure with extensive heterogeneous airspace opacity within the imaged bilateral lung bases. Please refer to dedicated chest radiograph performed earlier same day.   Electronically Signed   By: Sandi Mariscal M.D.   On: 08/24/2013 15:35   ASSESSMENT / PLAN:  PULMONARY A:   Acute respiratory failure Likely COPD with exacerbation - but no PFT available, not on oxygen prior to admission and has minimal pulmonary symptoms Asbestos related pleural disease - but no radiological evidence of parenchymal involvement.  Posterior upper lobe GGO concerning for BAC P:   - unable to wean with peep needs -if to 50%R then peep to 8 -keep MV same - Solu-Medrol IV Q8, maintain  - Needs follow up CT to follow GGO in 6-8 wks. - wean versed gtt to off, consider addition of oral agents to aide in weaning. Tolerated propofol but was switched to precedex for cath lab and then to versed when he did not tolerate precedex for procedure -even balance goals  CARDIOVASCULAR A:   Severe CAD NSTEMI? Moderate to severe AS Hypotension - likely secondary to procedural sedation / positive pressure ventilation P:   - Goal MAP>65. - Cardiology following. - Not a surgical candidate according to TCTS. - Cath lab / PTCA / stent when blood cultures clear. - Levophed gtt for MAP >60, add vasopressin  - Continue ASA, Zocor, Plavix. - Hold Bisoprolol, Imdur  RENAL A:  Component edema P:   - Trend BMP. - lasix per cards  GASTROINTESTINAL A: Nutrition SUP P:   - Continue TF - Protonix.  HEMATOLOGIC A:   VTE Px Some additional rise wbc from hemoconcetration P:  - Trend CBC. - Heparin as ordered.  INFECTIOUS A:   Staph bacteremia P:   - ID following. - Cultures and antibiotics as above. - PCT.  ENDOCRINE  A:   DM  P:   - SSI.  NEUROLOGIC A:   Acute encephalopathy delerium P:   - Goal RASS -1 - Fentanyl gtt, need to maximzie this agent further - wean versed to off if able, limit  -add Risperdal bid  Noe Gens, NP-C [facility] Pgr: [phone] or [phone]   I have personally obtained history, examined patient, evaluated and interpreted laboratory and imaging results, reviewed medical records, formulated assessment / plan and placed orders.  CRITICAL CARE:  The patient is critically ill with multiple organ systems failure and requires high complexity decision making for assessment and support, frequent evaluation and titration of therapies, application of advanced monitoring technologies and extensive interpretation of multiple databases. Critical Care Time devoted to patient care services described in this note is 30 minutes.   08/29/2013, 8:15 AM  [name]. Titus Mould, MD, [place] Pgr: [facility]

## 2013-08-29 NOTE — Progress Notes (Signed)
Noticed pt went into afib on monitor, vss, ekg obtained, spoke with dr. Ulyses Amor no further order received will continue to monitor.

## 2013-08-29 NOTE — Progress Notes (Signed)
Subjective: Mr. Radziewicz is a 73 yo man pmh AS, CAD, asbestosis, COPD, p/w NSTEMI on 08/13/2013. LHC mod severe-AS, high grade ostial circumblex lesion, mod-severe LAD, pulm HTN with coag - staph bacteremia not CABG candidate.   Intubated/sedated this AM.  Objective: Vital signs in last 24 hours: Filed Vitals:   08/29/13 0500 08/29/13 0600 08/29/13 0732 08/29/13 0737  BP:      Pulse: 74 72  72  Temp:      TempSrc:      Resp: 29 30  0  Height:      Weight:      SpO2: 98% 98% 98% 98%   Weight change: -10.6 oz (-0.3 kg)  Intake/Output Summary (Last 24 hours) at 08/29/13 0749 Last data filed at 08/29/13 0600  Gross per 24 hour  Intake 2549.29 ml  Output   2035 ml  Net 514.29 ml   General: intubated/sedated HEENT: PERRL, EOMI, no scleral icterus Cardiac: RRR, harsh 3/6 systolic murmur Pulm: CTA anteriorly Abd: soft, nontender, nondistended, BS present, right groin with no hematoma and no bruit Ext: no pedal edema Neuro: intubated and sedated.  Lab Results: Basic Metabolic Panel:  Recent Labs Lab 08/19/2013 0455 08/12/2013 1330 08/29/13 0405  NA 140  --  142  K 4.6  --  4.4  CL 97  --  101  CO2 27  --  25  GLUCOSE 239*  --  198*  BUN 42*  --  56*  CREATININE 1.42* 1.44* 1.55*  CALCIUM 7.9*  --  7.8*  MG  --   --  3.4*  PHOS  --   --  4.4    CBC:  Recent Labs Lab 08/23/13 0300 08/25/13 1042  08/22/2013 1330 08/29/13 0405  WBC 11.7* 15.5*  < > 32.0* 37.8*  NEUTROABS  --  13.1*  --   --   --   HGB 9.8* 10.2*  < > 10.4* 10.5*  HCT 29.1* 30.3*  < > 31.2* 32.5*  MCV 84.8 84.9  < > 86.0 87.1  PLT 141* 191  < > 637* 639*  < > = values in this interval not displayed. Cardiac Enzymes:  Recent Labs Lab 08/27/13 1015 08/27/13 1620 08/27/13 2215  TROPONINI <0.30 0.81* 0.67*   BNP:  Recent Labs Lab 08/27/13 0805  PROBNP 2423.0*    CBG:  Recent Labs Lab 08/22/2013 2358 08/29/13 0056 08/29/13 0203 08/29/13 0257 08/29/13 0353 08/29/13 0500  GLUCAP 185*  189* 179* 194* 190* 166*   Cardiac Studies: Echo 10/14: report not available [facility]   Echo 2/15: EF 16-96%, grade 2 diastolic dysfunction, mod AS  Studies/Results: Dg Chest Port 1 View  08/29/2013   CLINICAL DATA:  Endotracheal tube placement.  EXAM: PORTABLE CHEST - 1 VIEW  COMPARISON:  08/26/2013  FINDINGS: Endotracheal tube is unchanged with tip at the inferior margin of the clavicular heads, well above the carina. Left jugular central venous catheter is unchanged with tip overlying the mid upper SVC enteric tube courses into the left upper abdomen with tip not imaged. Cardiomediastinal silhouette is grossly unchanged. Extensive calcified pleural plaques are again seen. Lung volumes are slightly smaller than on the prior study. Extensive bilateral airspace disease overall does not appear significantly changed. No definite pleural effusion or pneumothorax is identified.  IMPRESSION: Unchanged appearance of support lines and tubes. No significant interval change in diffuse bilateral lung opacities.   Electronically Signed   By: Logan Bores   On: 08/29/2013 07:32   Dg Chest [facility]  1 View  08/31/2013   CLINICAL DATA:  Blood in endotracheal tube.  EXAM: PORTABLE CHEST - 1 VIEW  COMPARISON:  DG CHEST 1V PORT dated 08/21/2013; DG CHEST 1V PORT dated 08/27/2013; CT CHEST W/O CM dated 08/23/2013  FINDINGS: Endotracheal tube tip is approximately 3 cm above the carina. Central line and gastric decompression to shows stable positioning. Extensive bilateral airspace disease and edema again identified. Heavily calcified bilateral pleural plaques are also again visualized. The heart size is stable. No pneumothorax or significant pleural fluid identified.  IMPRESSION: Stable diffuse pulmonary edema and bilateral airspace disease.   Electronically Signed   By: Aletta Edouard M.D.   On: 08/22/2013 15:35   Dg Chest Port 1 View  08/15/2013   CLINICAL DATA:  Intubation, history of COPD  EXAM: PORTABLE CHEST - 1 VIEW   COMPARISON:  Portable chest x-ray of 08/27/2013  FINDINGS: The tip of the endotracheal tube is approximately 5.2 cm above the carina. Diffuse airspace disease is stable. The does appear to be calcification of the hemidiaphragms with some calcified pleural plaques indicating asbestos related disease. Mild cardiomegaly is stable. Left IJ central venous line tip is seen to the mid proximal SVC  IMPRESSION: 1. No change in diffuse airspace disease. 2. Tip of endotracheal tube approximately 5.2 cm above the carinal.   Electronically Signed   By: Ivar Drape M.D.   On: 08/27/2013 10:45   Dg Chest Port 1 View  08/27/2013   CLINICAL DATA:  Status post left central line placement  EXAM: PORTABLE CHEST - 1 VIEW  COMPARISON:  08/27/2013 804 hrs  FINDINGS: Cardiac shadow remains enlarged. An endotracheal tube is now seen with the tip 5.6 cm above the carina. A new left central venous line is noted with the tip in the proximal superior vena cava. No definitive pneumothorax is seen. A nasogastric catheter extends into the stomach beneath the edge of the film. Diffuse pleural calcifications are again seen bilaterally. Patchy interstitial changes are again identified stable from the prior exam. The linear density is noted overlying the mid left cardiac border stable from the prior exam. This is likely related to the pleural calcifications. No other focal abnormality is seen.  IMPRESSION: Chronic changes in the lungs bilaterally.  Endotracheal tube, nasogastric catheter and left central venous line in satisfactory position. No pneumothorax is noted.   Electronically Signed   By: Inez Catalina M.D.   On: 08/27/2013 11:23   Dg Chest Port 1v Same Day  08/27/2013   CLINICAL DATA:  Shortness of Breath  EXAM: PORTABLE CHEST - 1 VIEW SAME DAY  COMPARISON:  August 23, 2013 chest CT and August 25, 2013 chest radiograph  FINDINGS: There is extensive pleural and diaphragmatic calcification consistent with prior asbestos exposure.  There is diffuse interstitial disease bilaterally which may be due to to acute interstitial edema superimposed on chronic interstitial disease, likely due to asbestosis. There is also underlying emphysematous change.  Heart is enlarged. The pulmonary vascularity reflects underlying emphysema.  IMPRESSION: The overall appearance is felt to be secondary to congestive heart failure superimposed on emphysema and underlying asbestos exposure/ asbestosis. The appearance is stable compared to 2 days prior.   Electronically Signed   By: Lowella Grip M.D.   On: 08/27/2013 08:13   Dg Abd Portable 1v  08/16/2013   CLINICAL DATA:  Blood in enteric tube  EXAM: PORTABLE ABDOMEN - 1 VIEW  COMPARISON:  DG ABD 1 VIEW dated 08/25/2013; CT ABD/PELV WO CM dated  08/31/2013; DG CHEST 1V PORT dated 08/25/2013; CT CHEST W/O CM dated 08/23/2013; DG CHEST 2 VIEW dated 08/19/2013  FINDINGS: Enteric tube tip and side port overlie the expected location of the descending duodenum.  Paucity of bowel gas without definite evidence of obstruction. No supine evidence of pneumoperitoneum. No definite pneumatosis or portal venous gas.  Limited visualization of lower thorax demonstrates extensive bilateral pleural calcifications and heterogeneous airspace opacities within image bilateral lung bases.  IMPRESSION: 1. Enteric tube tip and side port projected over the expected location of the descending duodenum. 2. Nonobstructive bowel gas pattern. 3. Sequela prior asbestos exposure with extensive heterogeneous airspace opacity within the imaged bilateral lung bases. Please refer to dedicated chest radiograph performed earlier same day.   Electronically Signed   By: Sandi Mariscal M.D.   On: 08/29/2013 15:35   Medications: I have reviewed the patient's current medications. Scheduled Meds: . antiseptic oral rinse  15 mL Mouth Rinse QID  . aspirin  81 mg Oral Daily  . budesonide-formoterol  2 puff Inhalation BID  . ceFEPime (MAXIPIME) IV  1 g  Intravenous Q24H  . chlorhexidine  15 mL Mouth/Throat BID  . docusate  100 mg Oral Daily  . feeding supplement (PRO-STAT SUGAR FREE 64)  30 mL Per Tube Q1200  . feeding supplement (VITAL HIGH PROTEIN)  1,000 mL Per Tube Q24H  . heparin  5,000 Units Subcutaneous 3 times per day  . levalbuterol  0.63 mg Nebulization Q6H  . methylPREDNISolone (SOLU-MEDROL) injection  60 mg Intravenous 3 times per day  . nystatin  5 mL Oral QID  . pantoprazole sodium  40 mg Per Tube Daily  . polyethylene glycol  17 g Oral Daily  . simvastatin  40 mg Oral q1800  . Ticagrelor  90 mg Oral BID  . tiotropium  18 mcg Inhalation Daily  . vancomycin  1,000 mg Intravenous Q24H   Continuous Infusions: . sodium chloride    . fentaNYL infusion INTRAVENOUS 100 mcg/hr (09/04/2013 1731)  . furosemide (LASIX) infusion 5 mg/hr (08/27/2013 1628)  . insulin (NOVOLIN-R) infusion 10.9 mL/hr at 08/29/13 0700  . midazolam (VERSED) infusion 6 mg/hr (08/29/13 0404)  . norepinephrine (LEVOPHED) Adult infusion 20 mcg/min (08/29/13 0600)   PRN Meds:.sodium chloride, fentaNYL, levalbuterol, magnesium hydroxide, nitroGLYCERIN, ondansetron (ZOFRAN) IV, sodium phosphate Assessment/Plan: #VDRF: pt intubated for respiratory failure in setting of COPD and asbestosis. Management per PCCM. Pt being treated for possible COPD exacerbation.  #hypotension: in setting of intubation and NSTEMI. Wean levophed as tolerated.  # NSTEMI. 2 vessel obstructive CAD by 08/29/2013 LHC with severe ostial LCx stenosis and moderate disease in the mid LAD. He is not a good surgical candidate. S/P PCI of Lcx; continue ASA and brilinta.  # Coagulase negative staph bacteremia. MRSA. Cultures from 08/24/13 are negative to date. Continue Vancomycin per ID. Cannot place PICC line until bacteremia clears. Patient is at increased risk for endocarditis of the AV. Plan is to complete 6 week course of antibiotics. If unable to clear bacteremia may need to consider TEE but  currently this will not alter therapy.   #Acute renal insufficiency. Baseline creatinine 1.0. DC lasix gtt and begin lasix 40 BID; follow renal function.  # Moderate aortic stenosis.  # COPD/asbestosis.  # Hyperlipidemia. On statin Rx.  # Bladder obstruction. Now with foley in place. On flomax.    # DM type 2.    LOS: 9 days   Aaron Edelman [facility] 08/29/2013 7:49 AM

## 2013-08-29 NOTE — Progress Notes (Addendum)
Pt agitated  SBP =200 . Pt bucking vent .Fentanyl gtt increased to 200 mcg/hr .versed increased from 5 mg to 6 mg /hr .Fentanyl 100 mcg bolus given from gtt bag .

## 2013-08-30 ENCOUNTER — Inpatient Hospital Stay (HOSPITAL_COMMUNITY): Payer: Medicare Other

## 2013-08-30 LAB — GLUCOSE, CAPILLARY
GLUCOSE-CAPILLARY: 163 mg/dL — AB (ref 70–99)
GLUCOSE-CAPILLARY: 171 mg/dL — AB (ref 70–99)
GLUCOSE-CAPILLARY: 192 mg/dL — AB (ref 70–99)
GLUCOSE-CAPILLARY: 167 mg/dL — AB (ref 70–99)
GLUCOSE-CAPILLARY: 169 mg/dL — AB (ref 70–99)
GLUCOSE-CAPILLARY: 174 mg/dL — AB (ref 70–99)
GLUCOSE-CAPILLARY: 165 mg/dL — AB (ref 70–99)
GLUCOSE-CAPILLARY: 154 mg/dL — AB (ref 70–99)
GLUCOSE-CAPILLARY: 158 mg/dL — AB (ref 70–99)
GLUCOSE-CAPILLARY: 159 mg/dL — AB (ref 70–99)
GLUCOSE-CAPILLARY: 144 mg/dL — AB (ref 70–99)
Glucose-Capillary: 150 mg/dL — ABNORMAL HIGH (ref 70–99)
Glucose-Capillary: 164 mg/dL — ABNORMAL HIGH (ref 70–99)
Glucose-Capillary: 166 mg/dL — ABNORMAL HIGH (ref 70–99)
Glucose-Capillary: 171 mg/dL — ABNORMAL HIGH (ref 70–99)
Glucose-Capillary: 174 mg/dL — ABNORMAL HIGH (ref 70–99)
Glucose-Capillary: 156 mg/dL — ABNORMAL HIGH (ref 70–99)
Glucose-Capillary: 169 mg/dL — ABNORMAL HIGH (ref 70–99)
Glucose-Capillary: 175 mg/dL — ABNORMAL HIGH (ref 70–99)
Glucose-Capillary: 180 mg/dL — ABNORMAL HIGH (ref 70–99)
Glucose-Capillary: 182 mg/dL — ABNORMAL HIGH (ref 70–99)
Glucose-Capillary: 141 mg/dL — ABNORMAL HIGH (ref 70–99)
Glucose-Capillary: 165 mg/dL — ABNORMAL HIGH (ref 70–99)
Glucose-Capillary: 175 mg/dL — ABNORMAL HIGH (ref 70–99)
Glucose-Capillary: 144 mg/dL — ABNORMAL HIGH (ref 70–99)
Glucose-Capillary: 151 mg/dL — ABNORMAL HIGH (ref 70–99)

## 2013-08-30 LAB — POCT I-STAT 3, ART BLOOD GAS (G3+)
Acid-Base Excess: 3 mmol/L — ABNORMAL HIGH (ref 0.0–2.0)
BICARBONATE: 29 meq/L — AB (ref 20.0–24.0)
O2 Saturation: 90 %
PH ART: 7.391 (ref 7.350–7.450)
PO2 ART: 60 mmHg — AB (ref 80.0–100.0)
TCO2: 30 mmol/L (ref 0–100)
pCO2 arterial: 47.7 mmHg — ABNORMAL HIGH (ref 35.0–45.0)

## 2013-08-30 LAB — CULTURE, BLOOD (ROUTINE X 2)
CULTURE: NO GROWTH
Culture: NO GROWTH

## 2013-08-30 LAB — CBC
HEMATOCRIT: 30 % — AB (ref 39.0–52.0)
Hemoglobin: 9.5 g/dL — ABNORMAL LOW (ref 13.0–17.0)
MCH: 28.6 pg (ref 26.0–34.0)
MCHC: 31.7 g/dL (ref 30.0–36.0)
MCV: 90.4 fL (ref 78.0–100.0)
Platelets: 429 10*3/uL — ABNORMAL HIGH (ref 150–400)
RBC: 3.32 MIL/uL — ABNORMAL LOW (ref 4.22–5.81)
RDW: 15.7 % — ABNORMAL HIGH (ref 11.5–15.5)
WBC: 26.6 10*3/uL — AB (ref 4.0–10.5)

## 2013-08-30 LAB — BASIC METABOLIC PANEL
BUN: 74 mg/dL — AB (ref 6–23)
CHLORIDE: 103 meq/L (ref 96–112)
CO2: 27 mEq/L (ref 19–32)
Calcium: 7.5 mg/dL — ABNORMAL LOW (ref 8.4–10.5)
Creatinine, Ser: 1.75 mg/dL — ABNORMAL HIGH (ref 0.50–1.35)
GFR calc non Af Amer: 37 mL/min — ABNORMAL LOW (ref >90)
GFR, EST AFRICAN AMERICAN: 42 mL/min — AB (ref >90)
Glucose, Bld: 174 mg/dL — ABNORMAL HIGH (ref 70–99)
POTASSIUM: 4.7 meq/L (ref 3.7–5.3)
Sodium: 142 mEq/L (ref 137–147)

## 2013-08-30 MED ORDER — RISPERIDONE 2 MG PO TABS
2.0000 mg | ORAL_TABLET | Freq: Two times a day (BID) | ORAL | Status: DC
  Administered 2013-08-30 – 2013-08-31 (×3): 2 mg via ORAL
  Filled 2013-08-30 (×5): qty 1

## 2013-08-30 NOTE — Progress Notes (Signed)
Subjective: Anthony Turner is a 73 yo man pmh AS, CAD, asbestosis, COPD, p/w NSTEMI on 08/17/2013. LHC mod severe-AS, high grade ostial circumblex lesion, mod-severe LAD, pulm HTN with coag - staph bacteremia not CABG candidate.   Intubated/sedated this AM.  Objective: Vital signs in last 24 hours: Filed Vitals:   08/30/13 0714 08/30/13 0800 08/30/13 0900 08/30/13 1000  BP:      Pulse: 92 89 96 90  Temp:  98 F (36.7 C)    TempSrc:  Oral    Resp: 30 25 12 30   Height:      Weight:      SpO2: 99% 99% 99% 98%   Weight change: 2 lb 6.8 oz (1.1 kg)  Intake/Output Summary (Last 24 hours) at 08/30/13 1008 Last data filed at 08/30/13 1000  Gross per 24 hour  Intake 3214.89 ml  Output   2720 ml  Net 494.89 ml   General: intubated/sedated HEENT: Normal Cardiac: RRR, harsh 3/6 systolic murmur Pulm: CTA anteriorly Abd: soft, nontender, nondistended, BS present, right groin with no hematoma and no bruit Ext: no pedal edema Neuro: intubated and sedated.  Lab Results: Basic Metabolic Panel:  Recent Labs Lab 08/10/2013 0455  08/29/13 0405 08/30/13 0500  NA 140  --  142 142  K 4.6  --  4.4 4.7  CL 97  --  101 103  CO2 27  --  25 27  GLUCOSE 239*  --  198* 174*  BUN 42*  --  56* 74*  CREATININE 1.42*  < > 1.55* 1.75*  CALCIUM 7.9*  --  7.8* 7.5*  MG  --   --  3.4*  --   PHOS  --   --  4.4  --   < > = values in this interval not displayed.  CBC:  Recent Labs Lab 08/25/13 1042  08/29/13 0405 08/30/13 0500  WBC 15.5*  < > 37.8* 26.6*  NEUTROABS 13.1*  --   --   --   HGB 10.2*  < > 10.5* 9.5*  HCT 30.3*  < > 32.5* 30.0*  MCV 84.9  < > 87.1 90.4  PLT 191  < > 639* 429*  < > = values in this interval not displayed. Cardiac Enzymes:  Recent Labs Lab 08/27/13 1015 08/27/13 1620 08/27/13 2215  TROPONINI <0.30 0.81* 0.67*   BNP:  Recent Labs Lab 08/27/13 0805  PROBNP 2423.0*    CBG:  Recent Labs Lab 08/30/13 0309 08/30/13 0410 08/30/13 0506 08/30/13 0605  08/30/13 0701 08/30/13 0801  GLUCAP 167* 171* 164* 169* 174* 156*   Cardiac Studies: Echo 10/14: report not available [facility]   Echo 2/15: EF 60-63%, grade 2 diastolic dysfunction, mod AS  Studies/Results: Dg Chest Port 1 View  08/30/2013   CLINICAL DATA:  Acute respiratory failure. On ventilator. COPD. Acute renal failure. Previous myocardial infarct.  EXAM: PORTABLE CHEST - 1 VIEW  COMPARISON:  08/29/2013  FINDINGS: Support lines and tubes in appropriate position. Cardiomegaly is stable. Heterogeneous bilateral interstitial and airspace opacities shows no significant change. Bilateral pleural thickening calcifications also unchanged. No pneumothorax identified.  IMPRESSION: No significant change compared with prior exam.   Electronically Signed   By: Earle Gell M.D.   On: 08/30/2013 08:27   Dg Chest Port 1 View  08/29/2013   CLINICAL DATA:  Endotracheal tube placement.  EXAM: PORTABLE CHEST - 1 VIEW  COMPARISON:  08/29/2013  FINDINGS: Endotracheal tube is unchanged with tip at the inferior margin of the clavicular  heads, well above the carina. Left jugular central venous catheter is unchanged with tip overlying the mid upper SVC enteric tube courses into the left upper abdomen with tip not imaged. Cardiomediastinal silhouette is grossly unchanged. Extensive calcified pleural plaques are again seen. Lung volumes are slightly smaller than on the prior study. Extensive bilateral airspace disease overall does not appear significantly changed. No definite pleural effusion or pneumothorax is identified.  IMPRESSION: Unchanged appearance of support lines and tubes. No significant interval change in diffuse bilateral lung opacities.   Electronically Signed   By: Logan Bores   On: 08/29/2013 07:32   Dg Chest Port 1 View  08/26/2013   CLINICAL DATA:  Blood in endotracheal tube.  EXAM: PORTABLE CHEST - 1 VIEW  COMPARISON:  DG CHEST 1V PORT dated 08/13/2013; DG CHEST 1V PORT dated 08/27/2013; CT CHEST W/O CM  dated 08/23/2013  FINDINGS: Endotracheal tube tip is approximately 3 cm above the carina. Central line and gastric decompression to shows stable positioning. Extensive bilateral airspace disease and edema again identified. Heavily calcified bilateral pleural plaques are also again visualized. The heart size is stable. No pneumothorax or significant pleural fluid identified.  IMPRESSION: Stable diffuse pulmonary edema and bilateral airspace disease.   Electronically Signed   By: Aletta Edouard M.D.   On: 08/22/2013 15:35   Dg Chest Port 1 View  08/17/2013   CLINICAL DATA:  Intubation, history of COPD  EXAM: PORTABLE CHEST - 1 VIEW  COMPARISON:  Portable chest x-ray of 08/27/2013  FINDINGS: The tip of the endotracheal tube is approximately 5.2 cm above the carina. Diffuse airspace disease is stable. The does appear to be calcification of the hemidiaphragms with some calcified pleural plaques indicating asbestos related disease. Mild cardiomegaly is stable. Left IJ central venous line tip is seen to the mid proximal SVC  IMPRESSION: 1. No change in diffuse airspace disease. 2. Tip of endotracheal tube approximately 5.2 cm above the carinal.   Electronically Signed   By: Ivar Drape M.D.   On: 09/04/2013 10:45   Dg Abd Portable 1v  08/14/2013   CLINICAL DATA:  Blood in enteric tube  EXAM: PORTABLE ABDOMEN - 1 VIEW  COMPARISON:  DG ABD 1 VIEW dated 08/25/2013; CT ABD/PELV WO CM dated 08/31/2013; DG CHEST 1V PORT dated 08/18/2013; CT CHEST W/O CM dated 08/23/2013; DG CHEST 2 VIEW dated 08/19/2013  FINDINGS: Enteric tube tip and side port overlie the expected location of the descending duodenum.  Paucity of bowel gas without definite evidence of obstruction. No supine evidence of pneumoperitoneum. No definite pneumatosis or portal venous gas.  Limited visualization of lower thorax demonstrates extensive bilateral pleural calcifications and heterogeneous airspace opacities within image bilateral lung bases.  IMPRESSION: 1.  Enteric tube tip and side port projected over the expected location of the descending duodenum. 2. Nonobstructive bowel gas pattern. 3. Sequela prior asbestos exposure with extensive heterogeneous airspace opacity within the imaged bilateral lung bases. Please refer to dedicated chest radiograph performed earlier same day.   Electronically Signed   By: Sandi Mariscal M.D.   On: 08/26/2013 15:35   Medications: I have reviewed the patient's current medications. Scheduled Meds: . antiseptic oral rinse  15 mL Mouth Rinse QID  . aspirin  81 mg Oral Daily  . budesonide-formoterol  2 puff Inhalation BID  . ceFEPime (MAXIPIME) IV  1 g Intravenous Q24H  . chlorhexidine  15 mL Mouth/Throat BID  . docusate  100 mg Oral Daily  . feeding supplement (  PRO-STAT SUGAR FREE 64)  30 mL Per Tube Q1200  . feeding supplement (VITAL HIGH PROTEIN)  1,000 mL Per Tube Q24H  . furosemide  40 mg Intravenous BID  . heparin  5,000 Units Subcutaneous 3 times per day  . levalbuterol  0.63 mg Nebulization Q6H  . methylPREDNISolone (SOLU-MEDROL) injection  60 mg Intravenous 3 times per day  . nystatin  5 mL Oral QID  . pantoprazole sodium  40 mg Per Tube Daily  . polyethylene glycol  17 g Oral Daily  . risperiDONE  1 mg Oral BID  . simvastatin  40 mg Oral q1800  . Ticagrelor  90 mg Oral BID  . vancomycin  1,000 mg Intravenous Q24H   Continuous Infusions: . sodium chloride    . fentaNYL infusion INTRAVENOUS 250 mcg/hr (08/30/13 0832)  . insulin (NOVOLIN-R) infusion 10.5 mL/hr at 08/30/13 1000  . midazolam (VERSED) infusion 1 mg/hr (08/30/13 0800)  . norepinephrine (LEVOPHED) Adult infusion 14 mcg/min (08/30/13 0800)  . vasopressin (PITRESSIN) infusion - *FOR SHOCK* 0.03 Units/min (08/30/13 0800)   PRN Meds:.sodium chloride, fentaNYL, levalbuterol, magnesium hydroxide, nitroGLYCERIN, ondansetron (ZOFRAN) IV, sodium phosphate Assessment/Plan: #VDRF: pt intubated for respiratory failure in setting of COPD and asbestosis.  Management per PCCM. Pt being treated for possible COPD exacerbation.  #hypotension: in setting of intubation and NSTEMI. Wean levophed as tolerated.  # NSTEMI. 2 vessel obstructive CAD by 08/19/2013 LHC with severe ostial LCx stenosis and moderate disease in the mid LAD. He is not a good surgical candidate. S/P PCI of Lcx; continue ASA and brilinta.  # Coagulase negative staph bacteremia. MRSA. Cultures from 08/24/13 are negative to date. Continue Vancomycin per ID. Cannot place PICC line until bacteremia clears. Patient is at increased risk for endocarditis of the AV. Plan is to complete 6 week course of antibiotics. If unable to clear bacteremia may need to consider TEE but currently this will not alter therapy.   #Acute renal insufficiency. Baseline creatinine 1.0. Hold lasix today and follow  # Atrial fibrillation- brief atrial fibrillation on telemetry; in sinus at present follow; will not add anticoagulation at this point (patient on dual antiplt therapy following PCI).  # Moderate aortic stenosis.  # COPD/asbestosis.  # Hyperlipidemia. On statin Rx.  # Bladder obstruction. Now with foley in place. On flomax.    # DM type 2.    LOS: 10 days   Aaron Edelman [facility] 08/30/2013 10:08 AM

## 2013-08-30 NOTE — Progress Notes (Signed)
PULMONARY / CRITICAL CARE MEDICINE  Name: Anthony Turner MRN: [id number] DOB: 1940/10/02    ADMISSION DATE:  08/23/2013 CONSULTATION DATE:  08/27/2013  REFERRING MD :  Cardiology PRIMARY SERVICE:  Cardiology  CHIEF COMPLAINT: Acute respiratory failure  BRIEF PATIENT DESCRIPTION:  73 y/o with remote tobacco use (quit 25 years ago), COPD / asthma?, asbestos exposure, sob for "years" who was treated with Levaquin for 3 days prior to admit for URI admitted 2/12 with dyspnea / chest pain.  Cardiac cath 2/13 revealed moderate to severe AS, significant CAD, moderate pulmonary hypertension and diastolic heart failure.  Developed respiratory distress in 2/19, PCCM was consulted and patient was intubated.  SIGNIFICANT EVENTS / STUDIES:  2/12 - Admitted with dyspnea and chest pain 2/13 - TTE >>> EF 96-29%, grade 2 diastolic dysfunction 5/28 - Cardiac cath >>> moderate to severe AS, significant CAD, moderate pulmonary hypertension and diastolic heart failure 4/13 - Chest CT >>> Asbestos telated pleural disease, rounded atelectasis, no evidence of lung asbestosis, posterior upper lobe GGO concerning for [facility] 2/21 - remains on vent (10 peep/60%), pressors, no weaning.  AFIB overnight  LINES / TUBES: OETT 2/19 >>> OGT 2/19 >>> L IJ CVL 2/19 >>> Foley ? >>>  CULTURES: 2/12  MRSA >>> neg 2/14  Blood >>> Staph Species (coag neg 2/2) 2/16  Blood >>>  ANTIBIOTICS: Vancomycin  2/18 >>> Cefepime 2/20>>>  INTERVAL HISTORY:  Afib overnight, returned to NSR.  Versed weaned to 1mg , fent @250 , calmer   VITAL SIGNS: Temp:  [97.8 F (36.6 C)-98.8 F (37.1 C)] 97.9 F (36.6 C) (02/22 0400) Pulse Rate:  [71-107] 92 (02/22 0714) Resp:  [0-30] 30 (02/22 0714) BP: (104-111)/(47-54) 106/48 mmHg (02/22 0356) SpO2:  [96 %-100 %] 99 % (02/22 0714) Arterial Line BP: (98-170)/(40-73) 119/49 mmHg (02/22 0700) FiO2 (%):  [50 %-60 %] 50 % (02/22 0711) Weight:  [217 lb 2.5 oz (98.5 kg)] 217 lb 2.5 oz (98.5 kg)  (02/22 0500)  HEMODYNAMICS: CVP:  [12 mmHg-18 mmHg] 14 mmHg VENTILATOR SETTINGS: Vent Mode:  [-] PRVC FiO2 (%):  [50 %-60 %] 50 % Set Rate:  [30 bmp] 30 bmp Vt Set:  [510 mL] 510 mL PEEP:  [10 cmH20] 10 cmH20 Plateau Pressure:  [28 cmH20-31 cmH20] 29 cmH20  INTAKE / OUTPUT: Intake/Output     02/21 0701 - 02/22 0700 02/22 0701 - 02/23 0700   I.V. (mL/kg) 1632.1 (16.6) 70.4 (0.7)   NG/GT 1160 55   IV Piggyback 250 200   Total Intake(mL/kg) 3042.1 (30.9) 325.4 (3.3)   Urine (mL/kg/hr) 2200 (0.9) 155 (1.1)   Total Output 2200 155   Net +842.1 +170.4         PHYSICAL EXAMINATION: General:  Appears chronically ill, sedated and intubated. Neuro: Sedated, intubated but withdraws to pain. HEENT:  PERRL, No LAN/JVD. Cardiovascular:  RRR, 3/6 SEM at LUSB. Lungs: even/non-labored on vent, lungs bilaterally coarse, few lower crackles Abdomen:  Soft, nontender, bowel sounds diminished Musculoskeletal:  Moves all extremities, +lower ext edema Skin:  Intact, warm  LABS: CBC  Recent Labs Lab 09/05/2013 1330 08/29/13 0405 08/30/13 0500  WBC 32.0* 37.8* 26.6*  HGB 10.4* 10.5* 9.5*  HCT 31.2* 32.5* 30.0*  PLT 637* 639* 429*   BMET  Recent Labs Lab 08/27/2013 0455 08/17/2013 1330 08/29/13 0405 08/30/13 0500  NA 140  --  142 142  K 4.6  --  4.4 4.7  CL 97  --  101 103  CO2 27  --  25 27  BUN 42*  --  56* 74*  CREATININE 1.42* 1.44* 1.55* 1.75*  GLUCOSE 239*  --  198* 174*   Electrolytes  Recent Labs Lab 08/19/2013 0455 08/29/13 0405 08/30/13 0500  CALCIUM 7.9* 7.8* 7.5*  MG  --  3.4*  --   PHOS  --  4.4  --    Sepsis Markers  Recent Labs Lab 08/27/13 0930 08/27/13 1355 08/27/13 1848 08/13/2013 0048 08/18/2013 0455 08/29/13 0405  LATICACIDVEN  --  1.3 1.2 1.5  --   --   PROCALCITON 0.26  --   --   --  0.36 1.04   ABG  Recent Labs Lab 08/17/2013 1526 09/05/2013 1709 08/29/13 0341  PHART 7.323* 7.378 7.400  PCO2ART 53.6* 46.6* 43.8  PO2ART 67.0* 88.0 103.0*    Liver Enzymes  Recent Labs Lab 08/25/13 1042  AST 19  ALT 6  ALKPHOS 119*  BILITOT 0.4  ALBUMIN 2.8*   Cardiac Enzymes  Recent Labs Lab 08/27/13 0805 08/27/13 1015 08/27/13 1620 08/27/13 2215  TROPONINI  --  <0.30 0.81* 0.67*  PROBNP 2423.0*  --   --   --    Glucose  Recent Labs Lab 08/30/13 0015 08/30/13 0112 08/30/13 0211 08/30/13 0309 08/30/13 0410 08/30/13 0506  GLUCAP 166* 174* 150* 167* 171* 164*   Dg Chest Port 1 View  08/29/2013   CLINICAL DATA:  Endotracheal tube placement.  EXAM: PORTABLE CHEST - 1 VIEW  COMPARISON:  09/04/2013  FINDINGS: Endotracheal tube is unchanged with tip at the inferior margin of the clavicular heads, well above the carina. Left jugular central venous catheter is unchanged with tip overlying the mid upper SVC enteric tube courses into the left upper abdomen with tip not imaged. Cardiomediastinal silhouette is grossly unchanged. Extensive calcified pleural plaques are again seen. Lung volumes are slightly smaller than on the prior study. Extensive bilateral airspace disease overall does not appear significantly changed. No definite pleural effusion or pneumothorax is identified.  IMPRESSION: Unchanged appearance of support lines and tubes. No significant interval change in diffuse bilateral lung opacities.   Electronically Signed   By: Logan Bores   On: 08/29/2013 07:32   Dg Chest Port 1 View  08/22/2013   CLINICAL DATA:  Blood in endotracheal tube.  EXAM: PORTABLE CHEST - 1 VIEW  COMPARISON:  DG CHEST 1V PORT dated 08/11/2013; DG CHEST 1V PORT dated 08/27/2013; CT CHEST W/O CM dated 08/23/2013  FINDINGS: Endotracheal tube tip is approximately 3 cm above the carina. Central line and gastric decompression to shows stable positioning. Extensive bilateral airspace disease and edema again identified. Heavily calcified bilateral pleural plaques are also again visualized. The heart size is stable. No pneumothorax or significant pleural fluid  identified.  IMPRESSION: Stable diffuse pulmonary edema and bilateral airspace disease.   Electronically Signed   By: Aletta Edouard M.D.   On: 08/13/2013 15:35   Dg Chest Port 1 View  08/29/2013   CLINICAL DATA:  Intubation, history of COPD  EXAM: PORTABLE CHEST - 1 VIEW  COMPARISON:  Portable chest x-ray of 08/27/2013  FINDINGS: The tip of the endotracheal tube is approximately 5.2 cm above the carina. Diffuse airspace disease is stable. The does appear to be calcification of the hemidiaphragms with some calcified pleural plaques indicating asbestos related disease. Mild cardiomegaly is stable. Left IJ central venous line tip is seen to the mid proximal SVC  IMPRESSION: 1. No change in diffuse airspace disease. 2. Tip of endotracheal tube approximately 5.2 cm above the carinal.  Electronically Signed   By: Ivar Drape M.D.   On: 08/11/2013 10:45   Dg Abd Portable 1v  09/04/2013   CLINICAL DATA:  Blood in enteric tube  EXAM: PORTABLE ABDOMEN - 1 VIEW  COMPARISON:  DG ABD 1 VIEW dated 08/25/2013; CT ABD/PELV WO CM dated 08/25/2013; DG CHEST 1V PORT dated 08/09/2013; CT CHEST W/O CM dated 08/23/2013; DG CHEST 2 VIEW dated 08/16/2013  FINDINGS: Enteric tube tip and side port overlie the expected location of the descending duodenum.  Paucity of bowel gas without definite evidence of obstruction. No supine evidence of pneumoperitoneum. No definite pneumatosis or portal venous gas.  Limited visualization of lower thorax demonstrates extensive bilateral pleural calcifications and heterogeneous airspace opacities within image bilateral lung bases.  IMPRESSION: 1. Enteric tube tip and side port projected over the expected location of the descending duodenum. 2. Nonobstructive bowel gas pattern. 3. Sequela prior asbestos exposure with extensive heterogeneous airspace opacity within the imaged bilateral lung bases. Please refer to dedicated chest radiograph performed earlier same day.   Electronically Signed   By: Sandi Mariscal M.D.   On: 08/19/2013 15:35   ASSESSMENT / PLAN:  PULMONARY A:   Acute respiratory failure Likely COPD with exacerbation - but no PFT available, not on oxygen prior to admission and has minimal pulmonary symptoms Asbestos related pleural disease - but no radiological evidence of parenchymal involvement.  Posterior upper lobe GGO concerning for BAC Pulm edema from AS  P:   - Wean PEEP / FiO2 for sats >92% -Consider CPAP 8 ps 10-12 if pressors continue to improve - Solu-Medrol IV Q8  - Needs follow up CT to follow GGO in 6-8 wks. - goal even balance -worsening edema on pcxr?  CARDIOVASCULAR A:   Severe CAD - s/p Cath 2/20 w stenting of L Cx extending into L main NSTEMI? 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 - Cardiology following - Not a surgical candidate according to TCTS. - Levophed gtt for MAP >60, vasopressin  = improved overall - Continue ASA, Zocor, Plavix. - Hold Bisoprolol, Imdur  RENAL A:  Component edema AKI Over diuresis? P:   - Trend BMP. - lasix dc  GASTROINTESTINAL A: Nutrition SUP P:   - Continue TF - Protonix.  HEMATOLOGIC A:   VTE Px Some additional rise wbc from hemoconcetration P:  - Trend CBC further with pos balance - Heparin as ordered.  INFECTIOUS A:   Staph bacteremia P:   - ID following. - Cultures and antibiotics as above. - PCT not needed, dc  ENDOCRINE  A:   DM  P:   - SSI  NEUROLOGIC A:   Acute encephalopathy delerium P:   - Goal RASS -1 - Fentanyl gtt - wean versed to off if able, limit benzo's - Risperdal bid initiated 2/21, consider increase  Noe Gens, NP-C [facility] Pgr: [phone] or [phone]   I have personally obtained history, examined patient, evaluated and interpreted laboratory and imaging results, reviewed medical records, formulated assessment / plan  and placed orders.  CRITICAL CARE:  The patient is critically ill with multiple organ systems failure and requires high complexity decision making for assessment and support, frequent evaluation and titration of therapies, application of advanced monitoring technologies and extensive interpretation of multiple databases. Critical Care Time devoted to patient care services described in this note is 30 minutes.   08/30/2013, 8:25  AM  [name]. Titus Mould, MD, [place] Pgr: [facility]

## 2013-08-31 ENCOUNTER — Inpatient Hospital Stay (HOSPITAL_COMMUNITY): Payer: Medicare Other

## 2013-08-31 LAB — CBC
HCT: 31.4 % — ABNORMAL LOW (ref 39.0–52.0)
Hemoglobin: 9.2 g/dL — ABNORMAL LOW (ref 13.0–17.0)
MCH: 27.6 pg (ref 26.0–34.0)
MCHC: 29.3 g/dL — ABNORMAL LOW (ref 30.0–36.0)
MCV: 94.3 fL (ref 78.0–100.0)
Platelets: 362 10*3/uL (ref 150–400)
RBC: 3.33 MIL/uL — ABNORMAL LOW (ref 4.22–5.81)
RDW: 16.2 % — ABNORMAL HIGH (ref 11.5–15.5)
WBC: 22.9 10*3/uL — AB (ref 4.0–10.5)

## 2013-08-31 LAB — BASIC METABOLIC PANEL
BUN: 85 mg/dL — ABNORMAL HIGH (ref 6–23)
CO2: 29 meq/L (ref 19–32)
Calcium: 7.3 mg/dL — ABNORMAL LOW (ref 8.4–10.5)
Chloride: 107 mEq/L (ref 96–112)
Creatinine, Ser: 1.75 mg/dL — ABNORMAL HIGH (ref 0.50–1.35)
GFR calc Af Amer: 42 mL/min — ABNORMAL LOW (ref >90)
GFR calc non Af Amer: 37 mL/min — ABNORMAL LOW (ref >90)
Glucose, Bld: 138 mg/dL — ABNORMAL HIGH (ref 70–99)
Potassium: 4.8 mEq/L (ref 3.7–5.3)
Sodium: 148 mEq/L — ABNORMAL HIGH (ref 137–147)

## 2013-08-31 LAB — POCT I-STAT 3, ART BLOOD GAS (G3+)
Acid-Base Excess: 4 mmol/L — ABNORMAL HIGH (ref 0.0–2.0)
Acid-Base Excess: 3 mmol/L — ABNORMAL HIGH (ref 0.0–2.0)
Bicarbonate: 30.1 mEq/L — ABNORMAL HIGH (ref 20.0–24.0)
Bicarbonate: 30 mEq/L — ABNORMAL HIGH (ref 20.0–24.0)
O2 SAT: 92 %
O2 Saturation: 96 %
PCO2 ART: 59.9 mmHg — AB (ref 35.0–45.0)
PCO2 ART: 60.6 mmHg — AB (ref 35.0–45.0)
PH ART: 7.31 — AB (ref 7.350–7.450)
PO2 ART: 76 mmHg — AB (ref 80.0–100.0)
Patient temperature: 101.2
Patient temperature: 101.2
TCO2: 32 mmol/L (ref 0–100)
TCO2: 32 mmol/L (ref 0–100)
pH, Arterial: 7.316 — ABNORMAL LOW (ref 7.350–7.450)
pO2, Arterial: 95 mmHg (ref 80.0–100.0)

## 2013-08-31 LAB — GLUCOSE, CAPILLARY
GLUCOSE-CAPILLARY: 151 mg/dL — AB (ref 70–99)
GLUCOSE-CAPILLARY: 127 mg/dL — AB (ref 70–99)
GLUCOSE-CAPILLARY: 181 mg/dL — AB (ref 70–99)
GLUCOSE-CAPILLARY: 195 mg/dL — AB (ref 70–99)
GLUCOSE-CAPILLARY: 168 mg/dL — AB (ref 70–99)
GLUCOSE-CAPILLARY: 198 mg/dL — AB (ref 70–99)
GLUCOSE-CAPILLARY: 176 mg/dL — AB (ref 70–99)
Glucose-Capillary: 135 mg/dL — ABNORMAL HIGH (ref 70–99)
Glucose-Capillary: 137 mg/dL — ABNORMAL HIGH (ref 70–99)
Glucose-Capillary: 149 mg/dL — ABNORMAL HIGH (ref 70–99)
Glucose-Capillary: 151 mg/dL — ABNORMAL HIGH (ref 70–99)
Glucose-Capillary: 161 mg/dL — ABNORMAL HIGH (ref 70–99)
Glucose-Capillary: 163 mg/dL — ABNORMAL HIGH (ref 70–99)
Glucose-Capillary: 153 mg/dL — ABNORMAL HIGH (ref 70–99)
Glucose-Capillary: 127 mg/dL — ABNORMAL HIGH (ref 70–99)
Glucose-Capillary: 166 mg/dL — ABNORMAL HIGH (ref 70–99)
Glucose-Capillary: 192 mg/dL — ABNORMAL HIGH (ref 70–99)
Glucose-Capillary: 160 mg/dL — ABNORMAL HIGH (ref 70–99)
Glucose-Capillary: 177 mg/dL — ABNORMAL HIGH (ref 70–99)
Glucose-Capillary: 146 mg/dL — ABNORMAL HIGH (ref 70–99)
Glucose-Capillary: 152 mg/dL — ABNORMAL HIGH (ref 70–99)

## 2013-08-31 LAB — APTT: aPTT: 26 seconds (ref 24–37)

## 2013-08-31 LAB — PROTIME-INR
INR: 1.34 (ref 0.00–1.49)
PROTHROMBIN TIME: 16.3 s — AB (ref 11.6–15.2)

## 2013-08-31 MED ORDER — AMIODARONE HCL IN DEXTROSE 360-4.14 MG/200ML-% IV SOLN
60.0000 mg/h | INTRAVENOUS | Status: AC
  Administered 2013-08-31: 60 mg/h via INTRAVENOUS
  Filled 2013-08-31 (×2): qty 200

## 2013-08-31 MED ORDER — MIDAZOLAM HCL 2 MG/2ML IJ SOLN: 2.0000 mg | INTRAMUSCULAR | Status: DC | PRN

## 2013-08-31 MED ORDER — FUROSEMIDE 10 MG/ML IJ SOLN
40.0000 mg | Freq: Two times a day (BID) | INTRAMUSCULAR | Status: DC
  Administered 2013-08-31: 40 mg via INTRAVENOUS
  Filled 2013-08-31: qty 4

## 2013-08-31 MED ORDER — FUROSEMIDE 10 MG/ML IJ SOLN
60.0000 mg | Freq: Four times a day (QID) | INTRAMUSCULAR | Status: DC
  Administered 2013-08-31 – 2013-09-01 (×2): 60 mg via INTRAVENOUS
  Filled 2013-08-31 (×5): qty 6

## 2013-08-31 MED ORDER — AMIODARONE HCL IN DEXTROSE 360-4.14 MG/200ML-% IV SOLN
30.0000 mg/h | INTRAVENOUS | Status: DC
  Administered 2013-08-31 – 2013-09-01 (×2): 30 mg/h via INTRAVENOUS
  Filled 2013-08-31 (×3): qty 200

## 2013-08-31 MED ORDER — METHYLPREDNISOLONE SODIUM SUCC 40 MG IJ SOLR
40.0000 mg | Freq: Three times a day (TID) | INTRAMUSCULAR | Status: DC
  Administered 2013-08-31: 40 mg via INTRAVENOUS
  Filled 2013-08-31 (×2): qty 1

## 2013-08-31 MED FILL — Sodium Chloride IV Soln 0.9%: INTRAVENOUS | Qty: 50 | Status: AC

## 2013-08-31 NOTE — Progress Notes (Signed)
Pt heart rate increased to the 140-160's. Prior to increased HR, I decreased the fentanyl to 100 mcg and versed to 1 mg. Increased fentanyl to 150 mcg and versed to 2 mg. Notified Clyde Canterbury. No new orders received at this time. Will continue to monitor.

## 2013-08-31 NOTE — Progress Notes (Signed)
08/31/2013- Resp care note- Sputum hand delivered to lab at 0815- Sample dark red in color.

## 2013-08-31 NOTE — Progress Notes (Signed)
eLink Physician-Brief Progress Note Patient Name: Anthony Turner DOB: 08-06-1940 MRN: [id number]  Date of Service  08/31/2013   HPI/Events of Note   Hypoxemia ARDS vs pulmonary edema Bilateral infiltrates CVP 18   eICU Interventions   VT decreased 7 cc/kg f increased 32 PEEP increased 10 Lasix increased 60 q6h x 2 days ABG in 1 hour    Intervention Category Major Interventions: Respiratory failure - evaluation and management;Hypoxemia - evaluation and management  Hewitt Garner 08/31/2013, 9:02 PM

## 2013-08-31 NOTE — Progress Notes (Signed)
Patient ID: Anthony Turner, male   DOB: 09/15/1940, 73 y.o.   MRN: [id number]         [facility]    Date of Admission:  08/22/2013           Day 10 vancomycin        Day 3 cefepime                Principal Problem:   Bacteremia due to Staphylococcus Active Problems:   Aortic stenosis   NSTEMI (non-ST elevated myocardial infarction)   COPD (chronic obstructive pulmonary disease) with emphysema   Diabetes mellitus type 2, diet-controlled   HTN (hypertension)   HLD (hyperlipidemia)   Elevated PSA   Heme positive stool   Acute renal failure   CAD (coronary artery disease)   Normocytic anemia   Diastolic CHF   Asbestosis   Atherosclerosis   Splenomegaly   Nephrolithiasis   DJD (degenerative joint disease)   Acute respiratory failure   COPD with exacerbation   . antiseptic oral rinse  15 mL Mouth Rinse QID  . aspirin  81 mg Oral Daily  . budesonide-formoterol  2 puff Inhalation BID  . ceFEPime (MAXIPIME) IV  1 g Intravenous Q24H  . chlorhexidine  15 mL Mouth/Throat BID  . docusate  100 mg Oral Daily  . feeding supplement (PRO-STAT SUGAR FREE 64)  30 mL Per Tube Q1200  . feeding supplement (VITAL HIGH PROTEIN)  1,000 mL Per Tube Q24H  . furosemide  40 mg Intravenous Q12H  . heparin  5,000 Units Subcutaneous 3 times per day  . levalbuterol  0.63 mg Nebulization Q6H  . methylPREDNISolone (SOLU-MEDROL) injection  40 mg Intravenous 3 times per day  . nystatin  5 mL Oral QID  . pantoprazole sodium  40 mg Per Tube Daily  . polyethylene glycol  17 g Oral Daily  . risperiDONE  2 mg Oral BID  . simvastatin  40 mg Oral q1800  . Ticagrelor  90 mg Oral BID  . vancomycin  1,000 mg Intravenous Q24H   Objective: Temp:  [98.6 F (37 C)-101.8 F (38.8 C)] 101.8 F (38.8 C) (02/23 1624) Pulse Rate:  [99-135] 107 (02/23 1624) Resp:  [12-28] 22 (02/23 1624) BP: (82-123)/(40-59) 91/50 mmHg (02/23 1624) SpO2:  [93 %-100 %] 96 % (02/23 1624) Arterial Line  BP: (89-143)/(45-71) 94/47 mmHg (02/23 1600) FiO2 (%):  [50 %-60 %] 50 % (02/23 1600) Weight:  [100.1 kg (220 lb 10.9 oz)] 100.1 kg (220 lb 10.9 oz) (02/23 0413)  General: He remains intubated.  Lungs: Few rhonchi.  Cor: Regular S1-S2 with a 2/6 systolic murmur at the right sternal border  Lab Results Lab Results  Component Value Date   WBC 22.9* 08/31/2013   HGB 9.2* 08/31/2013   HCT 31.4* 08/31/2013   MCV 94.3 08/31/2013   PLT 362 08/31/2013    Lab Results  Component Value Date   CREATININE 1.75* 08/31/2013   BUN 85* 08/31/2013   NA 148* 08/31/2013   K 4.8 08/31/2013   CL 107 08/31/2013   CO2 29 08/31/2013    Lab Results  Component Value Date   ALT 6 08/25/2013   AST 19 08/25/2013   ALKPHOS 119* 08/25/2013   BILITOT 0.4 08/25/2013      Microbiology: Recent Results (from the past 240 hour(s))  CULTURE, BLOOD (ROUTINE X 2)     Status: None   Collection Time    08/22/13  2:00 AM  Result Value Ref Range Status   Specimen Description BLOOD RIGHT ARM   Final   Special Requests BOTTLES DRAWN AEROBIC AND ANAEROBIC 10CC   Final   Culture  Setup Time     Final   Value: 08/22/2013 12:10     Performed at [facility]   Culture     Final   Value: STAPHYLOCOCCUS SPECIES (COAGULASE NEGATIVE)     Note: SUSCEPTIBILITIES PERFORMED ON PREVIOUS CULTURE WITHIN THE LAST 5 DAYS.     Note: Gram Stain Report Called to,Read Back By and Verified With: [place] Hodgin RN on 08/23/13 at 02:20 by Rise Mu     Performed at [facility]   Report Status 08/25/2013 FINAL   Final  CULTURE, BLOOD (ROUTINE X 2)     Status: None   Collection Time    08/22/13  2:15 AM      Result Value Ref Range Status   Specimen Description BLOOD LEFT HAND   Final   Special Requests     Final   Value: BOTTLES DRAWN AEROBIC AND ANAEROBIC 10CC AER,5CC ANA   Culture  Setup Time     Final   Value: 08/22/2013 12:10     Performed at [facility]   Culture     Final   Value: STAPHYLOCOCCUS SPECIES  (COAGULASE NEGATIVE)     Note: RIFAMPIN AND GENTAMICIN SHOULD NOT BE USED AS SINGLE DRUGS FOR TREATMENT OF STAPH INFECTIONS.     Note: Gram Stain Report Called to,Read Back By and Verified With: Dellie Burns RN on 08/23/13 at 01:25 by Rise Mu     Performed at [facility]   Report Status 08/25/2013 FINAL   Final   Organism ID, Bacteria STAPHYLOCOCCUS SPECIES (COAGULASE NEGATIVE)   Final  CULTURE, BLOOD (ROUTINE X 2)     Status: None   Collection Time    08/24/13 10:05 AM      Result Value Ref Range Status   Specimen Description BLOOD RIGHT ARM   Final   Special Requests BOTTLES DRAWN AEROBIC ONLY 10CC   Final   Culture  Setup Time     Final   Value: 08/24/2013 13:56     Performed at [facility]   Culture     Final   Value: NO GROWTH 5 DAYS     Performed at [facility]   Report Status 08/30/2013 FINAL   Final  CULTURE, BLOOD (ROUTINE X 2)     Status: None   Collection Time    08/24/13 12:30 PM      Result Value Ref Range Status   Specimen Description BLOOD LEFT HAND   Final   Special Requests BOTTLES DRAWN AEROBIC ONLY [facility]   Final   Culture  Setup Time     Final   Value: 08/24/2013 16:16     Performed at [facility]   Culture     Final   Value: NO GROWTH 5 DAYS     Performed at [facility]   Report Status 08/30/2013 FINAL   Final    Studies/Results: Dg Chest Port 1 View  08/31/2013   CLINICAL DATA:  73 year old male with cardiomegaly. Respiratory failure, renal failure, chronic obstructed pulmonary disease. Initial encounter.  EXAM: PORTABLE CHEST - 1 VIEW  COMPARISON:  08/30/2013 and earlier.  FINDINGS: Portable AP semi upright view at 0728 hrs. Stable endotracheal tube tip just below the clavicles. Stable left IJ central line. Stable visible enteric tube.  Extensive bilateral calcified pleural plaque. No pneumothorax or definite effusion. Superimposed confluent multilobar left lung and right lung base opacity not significantly improved.  As before, much of the mediastinal contours obscured.  IMPRESSION: 1.  Stable lines and tubes. 2. No significant change in left multilobar and right lung base confluent opacity, superimposed on chronic lung disease with extensive calcified pleural plaques.   Electronically Signed   By: Lars Pinks M.D.   On: 08/31/2013 07:54   Dg Chest Port 1 View  08/30/2013   CLINICAL DATA:  Acute respiratory failure. On ventilator. COPD. Acute renal failure. Previous myocardial infarct.  EXAM: PORTABLE CHEST - 1 VIEW  COMPARISON:  08/29/2013  FINDINGS: Support lines and tubes in appropriate position. Cardiomegaly is stable. Heterogeneous bilateral interstitial and airspace opacities shows no significant change. Bilateral pleural thickening calcifications also unchanged. No pneumothorax identified.  IMPRESSION: No significant change compared with prior exam.   Electronically Signed   By: Earle Gell M.D.   On: 08/30/2013 08:27    Assessment: I will continue vancomycin for his methicillin resistant coagulase-negative staph bacteremia. He is at high risk for aortic valve endocarditis and will need at least 4 weeks of therapy. Repeat blood cultures were negative. He is also now on cefepime for possible superimposed healthcare associated pneumonia  Plan: 1. Continue vancomycin and cefepime  Michel Bickers, MD [facility] [phone] pager   [phone] cell 08/31/2013, 5:13 PM

## 2013-08-31 NOTE — Progress Notes (Signed)
PULMONARY / CRITICAL CARE MEDICINE  Name: Anthony Turner MRN: [id number] DOB: 02/17/1941    ADMISSION DATE:  09/03/2013 CONSULTATION DATE:  08/27/2013  REFERRING MD :  Cardiology PRIMARY SERVICE:  Cardiology  CHIEF COMPLAINT: Acute respiratory failure  BRIEF PATIENT DESCRIPTION:  73 y/o with remote tobacco use (quit 25 years ago), COPD / asthma?, asbestos exposure, sob for "years" who was treated with Levaquin for 3 days prior to admit for URI admitted 2/12 with dyspnea / chest pain.  Cardiac cath 2/13 revealed moderate to severe AS, significant CAD, moderate pulmonary hypertension and diastolic heart failure.  Developed respiratory distress in 2/19, PCCM was consulted and patient was intubated.  SIGNIFICANT EVENTS / STUDIES:  2/12 - Admitted with dyspnea and chest pain 2/13 - TTE >>> EF 42-35%, grade 2 diastolic dysfunction 3/61 - Cardiac cath >>> moderate to severe AS, significant CAD, moderate pulmonary hypertension and diastolic heart failure 4/43 - Chest CT >>> Asbestos telated pleural disease, rounded atelectasis, no evidence of lung asbestosis, posterior upper lobe GGO concerning for [facility] 2/21 - remains on vent (10 peep/60%), pressors, no weaning.  AFIB overnight 2/22 - pressors improved 2/23- amio for rvr  LINES / TUBES: OETT 2/19 >>> OGT 2/19 >>> L IJ CVL 2/19 >>> Foley ? >>>  CULTURES: 2/12  MRSA >>> neg 2/14  Blood >>> Staph Species (coag neg 2/2) 2/16  Blood >>>  ANTIBIOTICS: Vancomycin  2/18 >>> Cefepime 2/20>>>  INTERVAL HISTORY:  Amio for rvr  VITAL SIGNS: Temp:  [98.6 F (37 C)-99.3 F (37.4 C)] 98.6 F (37 C) (02/23 1200) Pulse Rate:  [99-135] 102 (02/23 1415) Resp:  [11-32] 23 (02/23 1415) BP: (82-123)/(40-59) 87/41 mmHg (02/23 1415) SpO2:  [91 %-100 %] 95 % (02/23 1415) Arterial Line BP: (89-143)/(45-71) 92/45 mmHg (02/23 1415) FiO2 (%):  [40 %-60 %] 50 % (02/23 1400) Weight:  [100.1 kg (220 lb 10.9 oz)] 100.1 kg (220 lb 10.9 oz) (02/23  0413)  HEMODYNAMICS: CVP:  [12 mmHg-18 mmHg] 17 mmHg VENTILATOR SETTINGS: Vent Mode:  [-] PRVC FiO2 (%):  [40 %-60 %] 50 % Set Rate:  [24 bmp] 24 bmp Vt Set:  [510 mL] 510 mL PEEP:  [5 cmH20-8 cmH20] 8 cmH20 Plateau Pressure:  [27 cmH20-32 cmH20] 27 cmH20  INTAKE / OUTPUT: Intake/Output     02/22 0701 - 02/23 0700 02/23 0701 - 02/24 0700   I.V. (mL/kg) 1562 (15.6) 490.6 (4.9)   NG/GT 1410 475   IV Piggyback 250 200   Total Intake(mL/kg) 3222 (32.2) 1165.6 (11.6)   Urine (mL/kg/hr) 2050 (0.9) 540 (0.7)   Emesis/NG output  10 (0)   Total Output 2050 550   Net +1172 +615.6         PHYSICAL EXAMINATION: General:  Appears chronically ill, sedated and intubated. Neuro: rass -2 HEENT:  jvd noted Cardiovascular:  RRR, 3/6 SEM at LUSB, more tachy Lungs: ronchi Abdomen:  Soft, nontender, bowel sounds diminished Musculoskeletal:  Moves all extremities, edema, early foot drop Skin:  Intact, warm  LABS: CBC  Recent Labs Lab 08/29/13 0405 08/30/13 0500 08/31/13 0400  WBC 37.8* 26.6* 22.9*  HGB 10.5* 9.5* 9.2*  HCT 32.5* 30.0* 31.4*  PLT 639* 429* 362   BMET  Recent Labs Lab 08/29/13 0405 08/30/13 0500 08/31/13 0400  NA 142 142 148*  K 4.4 4.7 4.8  CL 101 103 107  CO2 25 27 29   BUN 56* 74* 85*  CREATININE 1.55* 1.75* 1.75*  GLUCOSE 198* 174* 138*   Electrolytes  Recent Labs Lab 08/29/13 0405 08/30/13 0500 08/31/13 0400  CALCIUM 7.8* 7.5* 7.3*  MG 3.4*  --   --   PHOS 4.4  --   --    Sepsis Markers  Recent Labs Lab 08/27/13 0930 08/27/13 1355 08/27/13 1848 08/15/2013 0048 08/31/2013 0455 08/29/13 0405  LATICACIDVEN  --  1.3 1.2 1.5  --   --   PROCALCITON 0.26  --   --   --  0.36 1.04   ABG  Recent Labs Lab 08/18/2013 1709 08/29/13 0341 08/30/13 1546  PHART 7.378 7.400 7.391  PCO2ART 46.6* 43.8 47.7*  PO2ART 88.0 103.0* 60.0*   Liver Enzymes  Recent Labs Lab 08/25/13 1042  AST 19  ALT 6  ALKPHOS 119*  BILITOT 0.4  ALBUMIN 2.8*    Cardiac Enzymes  Recent Labs Lab 08/27/13 0805 08/27/13 1015 08/27/13 1620 08/27/13 2215  TROPONINI  --  <0.30 0.81* 0.67*  PROBNP 2423.0*  --   --   --    Glucose  Recent Labs Lab 08/31/13 0611 08/31/13 0716 08/31/13 0750 08/31/13 0818 08/31/13 0902 08/31/13 1008  GLUCAP 163* 166* 153* 127* 181* 192*   Dg Chest Port 1 View  08/31/2013   CLINICAL DATA:  73 year old male with cardiomegaly. Respiratory failure, renal failure, chronic obstructed pulmonary disease. Initial encounter.  EXAM: PORTABLE CHEST - 1 VIEW  COMPARISON:  08/30/2013 and earlier.  FINDINGS: Portable AP semi upright view at 0728 hrs. Stable endotracheal tube tip just below the clavicles. Stable left IJ central line. Stable visible enteric tube.  Extensive bilateral calcified pleural plaque. No pneumothorax or definite effusion. Superimposed confluent multilobar left lung and right lung base opacity not significantly improved. As before, much of the mediastinal contours obscured.  IMPRESSION: 1.  Stable lines and tubes. 2. No significant change in left multilobar and right lung base confluent opacity, superimposed on chronic lung disease with extensive calcified pleural plaques.   Electronically Signed   By: Lars Pinks M.D.   On: 08/31/2013 07:54   Dg Chest Port 1 View  08/30/2013   CLINICAL DATA:  Acute respiratory failure. On ventilator. COPD. Acute renal failure. Previous myocardial infarct.  EXAM: PORTABLE CHEST - 1 VIEW  COMPARISON:  08/29/2013  FINDINGS: Support lines and tubes in appropriate position. Cardiomegaly is stable. Heterogeneous bilateral interstitial and airspace opacities shows no significant change. Bilateral pleural thickening calcifications also unchanged. No pneumothorax identified.  IMPRESSION: No significant change compared with prior exam.   Electronically Signed   By: Earle Gell M.D.   On: 08/30/2013 08:27   ASSESSMENT / PLAN:  PULMONARY A:   Acute respiratory failure Likely COPD with  exacerbation - but no PFT available, not on oxygen prior to admission and has minimal pulmonary symptoms Asbestos related pleural disease - but no radiological evidence of parenchymal involvement.  Posterior upper lobe GGO concerning for BAC Pulm edema from AS  P:   - wean attempt needed, cpap 8, ps 10-12 - Solu-Medrol IV Q8, reduce - Needs follow up CT to follow GGO in 6-8 wks. - goal even balance to negative, worsening pcxr, consider even with pressors -worsening pcxr, repeat in am  -consider trach wed?  CARDIOVASCULAR A:   Severe CAD - s/p Cath 2/20 w stenting of L Cx extending into L main NSTEMI? 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 - Levophed gtt for MAP >60, vasopressin, can dc when to 5 levophed - Continue ASA, Zocor, Plavix. - Hold Bisoprolol, Imdur -lasix see pulm  RENAL A:  Worsening edema AKI P:   - Trend BMP. - lasix restart, neg 1 liter goal  GASTROINTESTINAL A: Nutrition SUP P:   - Continue TF - Protonix.  HEMATOLOGIC A:   VTE Px Some additional rise wbc from hemoconcetration P:  - Trend CBC further with pos balance - Heparin  INFECTIOUS A:   Staph bacteremia P:   - ID following. - Cultures and antibiotics as above.  ENDOCRINE  A:   DM  P:   - SSI -reduce steroids  NEUROLOGIC A:   Acute encephalopathy delerium P:   - Goal RASS -1 - Fentanyl gtt - to dc verse drip, to goal 2 q4h prn - Risperdal bid initiated 2/21, maintain  I have personally obtained history, examined patient, evaluated and interpreted laboratory and imaging results, reviewed medical records, formulated assessment / plan and placed orders.  CRITICAL CARE:  The patient is critically ill with multiple organ systems failure and requires high complexity  decision making for assessment and support, frequent evaluation and titration of therapies, application of advanced monitoring technologies and extensive interpretation of multiple databases. Critical Care Time devoted to patient care services described in this note is 30 minutes.   08/31/2013, 2:20 PM  [name]. Titus Mould, MD, [place] Pgr: [facility]

## 2013-08-31 NOTE — Progress Notes (Addendum)
Subjective:  Intubated/sedated   RN: HR going up to 140s-160s,Levo 10, Vasopressin 0.03, Na 148 FWD 3.4 L; RR 11-32, afebrile, BP 120/59-123/58. cbgs 60s on glucose stabilizer at 4.1 units. She is on Fentanyl 200>100>150, Versed 2>1. PEEP 8.    Objective:  Vital signs in last 24 hours: Filed Vitals:   08/31/13 0500 08/31/13 0600 08/31/13 0800 08/31/13 0803  BP:      Pulse: 100 124 116   Temp:      TempSrc:      Resp: 28 19 14    Height:      Weight:      SpO2: 100% 99% 99% 99%   Weight change: 3 lb 8.4 oz (1.6 kg)  Intake/Output Summary (Last 24 hours) at 08/31/13 0847 Last data filed at 08/31/13 0800  Gross per 24 hour  Intake 2971.63 ml  Output   1895 ml  Net 1076.63 ml   Vitals reviewed. General: resting in bed, NAD, intubated/sedated  HEENT: [place]/at, left central line Cardiac: AF (HR 175), +systolic murmur  Pulm: mild wheezing b/l  Abd: firm, nontender, +distended, BS present Ext: warm and well perfused, no pedal edema Neuro: intubated sedated   Lab Results: Basic Metabolic Panel:  Recent Labs Lab 08/12/2013 0455  08/29/13 0405 08/30/13 0500 08/31/13 0400  NA 140  --  142 142 148*  K 4.6  --  4.4 4.7 4.8  CL 97  --  101 103 107  CO2 27  --  25 27 29   GLUCOSE 239*  --  198* 174* 138*  BUN 42*  --  56* 74* 85*  CREATININE 1.42*  < > 1.55* 1.75* 1.75*  CALCIUM 7.9*  --  7.8* 7.5* 7.3*  MG  --   --  3.4*  --   --   PHOS  --   --  4.4  --   --   < > = values in this interval not displayed. Liver Function Tests:  Recent Labs Lab 08/25/13 1042  AST 19  ALT 6  ALKPHOS 119*  BILITOT 0.4  PROT 6.7  ALBUMIN 2.8*   CBC:  Recent Labs Lab 08/25/13 1042  08/30/13 0500 08/31/13 0400  WBC 15.5*  < > 26.6* 22.9*  NEUTROABS 13.1*  --   --   --   HGB 10.2*  < > 9.5* 9.2*  HCT 30.3*  < > 30.0* 31.4*  MCV 84.9  < > 90.4 94.3  PLT 191  < > 429* 362  < > = values in this interval not displayed. Cardiac Enzymes:  Recent Labs Lab 08/27/13 1015  08/27/13 1620 08/27/13 2215  TROPONINI <0.30 0.81* 0.67*   BNP:  Recent Labs Lab 08/27/13 0805  PROBNP 2423.0*   CBG:  Recent Labs Lab 08/31/13 0211 08/31/13 0322 08/31/13 0407 08/31/13 0511 08/31/13 0611 08/31/13 0750  GLUCAP 151* 137* 127* 161* 163* 153*   Fasting Lipid Panel: Lipid Panel     Component Value Date/Time   CHOL 115 08/14/2013 0330   TRIG 173* 09/04/2013 0330   HDL 17* 08/18/2013 0330   CHOLHDL 6.8 08/14/2013 0330   VLDL 35 08/22/2013 0330   LDLCALC 63 08/10/2013 0330    Misc. Labs: Respiratory culture  Blood cultures   Micro Results: Recent Results (from the past 240 hour(s))  CULTURE, BLOOD (ROUTINE X 2)     Status: None   Collection Time    08/22/13  2:00 AM      Result Value Ref Range Status  Specimen Description BLOOD RIGHT ARM   Final   Special Requests BOTTLES DRAWN AEROBIC AND ANAEROBIC 10CC   Final   Culture  Setup Time     Final   Value: 08/22/2013 12:10     Performed at [facility]   Culture     Final   Value: STAPHYLOCOCCUS SPECIES (COAGULASE NEGATIVE)     Note: SUSCEPTIBILITIES PERFORMED ON PREVIOUS CULTURE WITHIN THE LAST 5 DAYS.     Note: Gram Stain Report Called to,Read Back By and Verified With: [place] Hodgin RN on 08/23/13 at 02:20 by Rise Mu     Performed at [facility]   Report Status 08/25/2013 FINAL   Final  CULTURE, BLOOD (ROUTINE X 2)     Status: None   Collection Time    08/22/13  2:15 AM      Result Value Ref Range Status   Specimen Description BLOOD LEFT HAND   Final   Special Requests     Final   Value: BOTTLES DRAWN AEROBIC AND ANAEROBIC 10CC AER,5CC ANA   Culture  Setup Time     Final   Value: 08/22/2013 12:10     Performed at [facility]   Culture     Final   Value: STAPHYLOCOCCUS SPECIES (COAGULASE NEGATIVE)     Note: RIFAMPIN AND GENTAMICIN SHOULD NOT BE USED AS SINGLE DRUGS FOR TREATMENT OF STAPH INFECTIONS.     Note: Gram Stain Report Called to,Read Back By and Verified  With: Dellie Burns RN on 08/23/13 at 01:25 by Rise Mu     Performed at [facility]   Report Status 08/25/2013 FINAL   Final   Organism ID, Bacteria STAPHYLOCOCCUS SPECIES (COAGULASE NEGATIVE)   Final  CULTURE, BLOOD (ROUTINE X 2)     Status: None   Collection Time    08/24/13 10:05 AM      Result Value Ref Range Status   Specimen Description BLOOD RIGHT ARM   Final   Special Requests BOTTLES DRAWN AEROBIC ONLY 10CC   Final   Culture  Setup Time     Final   Value: 08/24/2013 13:56     Performed at [facility]   Culture     Final   Value: NO GROWTH 5 DAYS     Performed at [facility]   Report Status 08/30/2013 FINAL   Final  CULTURE, BLOOD (ROUTINE X 2)     Status: None   Collection Time    08/24/13 12:30 PM      Result Value Ref Range Status   Specimen Description BLOOD LEFT HAND   Final   Special Requests BOTTLES DRAWN AEROBIC ONLY [facility]   Final   Culture  Setup Time     Final   Value: 08/24/2013 16:16     Performed at [facility]   Culture     Final   Value: NO GROWTH 5 DAYS     Performed at [facility]   Report Status 08/30/2013 FINAL   Final   Results for Anthony, Turner (MRN [id number]) as of 08/31/2013 09:04  Ref. Range 09/03/2013 17:09 08/29/2013 03:41 08/30/2013 15:46  Sample type No range found ARTERIAL ARTERIAL ARTERIAL  pH, Arterial Latest Range: 7.350-7.450  7.378 7.400 7.391  pCO2 arterial Latest Range: 35.0-45.0 mmHg 46.6 (H) 43.8 47.7 (H)  pO2, Arterial Latest Range: 80.0-100.0 mmHg 88.0 103.0 (H) 60.0 (L)  Bicarbonate Latest Range: 20.0-24.0 mEq/L 27.4 (H) 27.2 (H) 29.0 (H)  TCO2  Latest Range: 0-100 mmol/L 29 29 30   Acid-Base Excess Latest Range: 0.0-2.0 mmol/L 2.0 2.0 3.0 (H)  O2 Saturation No range found 96.0 98.0 90.0  Patient temperature No range found 98.6 F 98.4 F 98.2 F  Collection site No range found ARTERIAL LINE RADIAL, ALLEN'S TEST ACCEPTABLE ARTERIAL LINE   Results for Anthony, Turner (MRN [id number]) as of  08/31/2013 09:04  Ref. Range 08/25/2013 10:42 08/27/2013 08:05 08/27/2013 10:15 08/27/2013 16:20 08/27/2013 22:15  Troponin I Latest Range: <0.30 ng/mL <0.30  <0.30 0.81 (HH) 0.67 (HH)  Pro B Natriuretic peptide (BNP) Latest Range: 0-125 pg/mL  2423.0 (H)      Results for TAYVIEN, KANE (MRN [id number]) as of 08/31/2013 09:04  Ref. Range 08/09/2013 11:46 08/29/2013 12:03 08/11/2013 14:38 08/15/2013 21:30 09/03/2013 03:30  Troponin I Latest Range: <0.30 ng/mL   0.74 (HH) 0.63 (HH) 0.37 (HH)  Troponin i, poc Latest Range: 0.00-0.08 ng/mL  0.40 (HH)     Pro B Natriuretic peptide (BNP) Latest Range: 0-125 pg/mL 679.3 (H)       Studies/Results: Dg Chest Port 1 View  08/31/2013   CLINICAL DATA:  73 year old male with cardiomegaly. Respiratory failure, renal failure, chronic obstructed pulmonary disease. Initial encounter.  EXAM: PORTABLE CHEST - 1 VIEW  COMPARISON:  08/30/2013 and earlier.  FINDINGS: Portable AP semi upright view at 0728 hrs. Stable endotracheal tube tip just below the clavicles. Stable left IJ central line. Stable visible enteric tube.  Extensive bilateral calcified pleural plaque. No pneumothorax or definite effusion. Superimposed confluent multilobar left lung and right lung base opacity not significantly improved. As before, much of the mediastinal contours obscured.  IMPRESSION: 1.  Stable lines and tubes. 2. No significant change in left multilobar and right lung base confluent opacity, superimposed on chronic lung disease with extensive calcified pleural plaques.   Electronically Signed   By: Lars Pinks M.D.   On: 08/31/2013 07:54   Dg Chest Port 1 View  08/30/2013   CLINICAL DATA:  Acute respiratory failure. On ventilator. COPD. Acute renal failure. Previous myocardial infarct.  EXAM: PORTABLE CHEST - 1 VIEW  COMPARISON:  08/29/2013  FINDINGS: Support lines and tubes in appropriate position. Cardiomegaly is stable. Heterogeneous bilateral interstitial and airspace opacities shows no significant  change. Bilateral pleural thickening calcifications also unchanged. No pneumothorax identified.  IMPRESSION: No significant change compared with prior exam.   Electronically Signed   By: Earle Gell M.D.   On: 08/30/2013 08:27   08/23/13 CT chest  FINDINGS:  Mediastinum: Heart size is mildly enlarged. There is no significant  pericardial fluid, thickening or pericardial calcification. Multiple  borderline enlarged mediastinal lymph nodes. No definite pathologic  mediastinal or hilar nodal enlargement. Please note that accurate  exclusion of hilar adenopathy is limited on noncontrast CT scans.  Small hiatal hernia with some circumferential thickening of the  distal third of the esophagus. There is atherosclerosis of the  thoracic aorta, the great vessels of the mediastinum and the  coronary arteries, including calcified atherosclerotic plaque in the  left main, left anterior descending, left circumflex and right  coronary arteries. Severe calcifications of the aortic valve.  Lungs/Pleura: Extensive calcified pleural plaque is again noted  throughout the thorax bilaterally, compatible with asbestos related  pleural disease. Associated with this there are pleural-based areas  of architectural distortion, some of which are slightly masslike,  most notable in the right lower lobe and the left upper lobe,  favored to reflect areas of developing rounded atelectasis. Several  areas  of ground-glass attenuation are seen scattered throughout the  lungs bilaterally, which are nonspecific, largest of which is in the  posterior aspect of the left upper lobe (image 16 of series 3 and  image 76 of series [id number]) measuring approximately 2.3 x 1.4 x 3.1  cm. No definite areas of subpleural reticulation, traction  bronchiectasis or frank honeycombing are identified at this time.  Upper Abdomen: Referred to contemporaneous CT of the abdomen and  pelvis 08/10/2013 for full description of abdominal findings.   Musculoskeletal: There are no aggressive appearing lytic or blastic  lesions noted in the visualized portions of the skeleton.  IMPRESSION:  1. Severe asbestos related pleural disease with multifocal areas of  probable developing rounded atelectasis, as detailed above.  2. No definite imaging findings at this time to strongly suggest  asbestosis in the lungs, however, given the extent of multifocal  ground-glass attenuation, early changes of asbestosis is not  excluded, and followup high-resolution chest CT is recommended to  assess for temporal changes in the appearance of the lung  parenchyma.  3. Amongst the areas of ground-glass attenuation there is one that  is slightly mass like in appearance. This is nonspecific, but  measures approximately 3.1 x 1.4 x 2.3 cm in the posterior aspect of  the left upper lobe. The possibility of an adenocarcinoma is not  excluded, and repeat evaluation with high-resolution chest CT in 3  months is recommended at this time to confirm persistence and assess  stability. This recommendation follows the consensus statement:  Recommendations for the Management of Subsolid Pulmonary Nodules  Detected at CT: A Statement from the Fleischner Society as published  in Radiology 2013; 266:304-317.  4. Atherosclerosis, including left main and 3 vessel coronary artery  disease. Assessment for potential risk factor modification, dietary  therapy or pharmacologic therapy may be warranted, if clinically  indicated.  5. There are calcifications of the aortic valve. Echocardiographic  correlation for evaluation of potential valvular dysfunction may be  warranted if clinically indicated.  6. Mild cardiomegaly.   08/15/2013 CT ab/pelvis  IMPRESSION:  1. No acute findings in the abdomen or pelvis to account for the  patient's symptoms.  2. Colonic diverticulosis without findings to suggest acute  diverticulitis at this time.  3. Splenomegaly.  4. Asbestos related  pleural disease.  5. Trace left pleural effusion.  6. Extensive atherosclerosis, including at least 3 vessel coronary  artery disease.  7. There are calcifications of the aortic valve. Echocardiographic  correlation for evaluation of potential valvular dysfunction may be  warranted if clinically indicated.  8. Small hiatal hernia.  9. Hepatic steatosis.  10. Additional incidental findings, as above.   09/04/2013 cath  Procedural Details: The right groin was prepped, draped, and anesthetized with 1% lidocaine. Using the modified Seldinger technique, a 7 Fr sheath was introduced into the right femoral artery. Weight-based bivalirudin was given for anticoagulation. Once a therapeutic ACT was achieved, a 7 [place] XB 3.5 guide catheter was inserted. A prowater coronary guidewire was used to cross the lesion. A second prowater wire was placed down the LAD. The lesion was predilated with a 2.5 mm Angiosculpt balloon. The lesion was then stented with a 3.5 x 16 mm Promus stent covering the ostial LCx and extending into the left main. The LAD wire was then withdrawn and redirected through the stent struts into the LAD again. The stent struts were then dilated into the LAD with a 3.0 mm Mul[place]ugh balloon. The Ostial LCx and  left main portion of the stent was postdilated with a 4.0 mm noncompliant balloon. Following PCI, there was 0% residual stenosis and TIMI-3 flow in both the LAD and LCx. Final angiography confirmed an excellent result. The patient tolerated the procedure well. He was agitated at times and was sedated with additional Versed and Fentanyl IV. There were no immediate procedural complications. Angiomax was discontinued post procedure and the patient was transferred to the ICU for further monitoring and removal of the sheath later. 85 cc of contrast was used.  Lesion Data:  Vessel: LCx-ostial  Percent stenosis (pre): 95%  TIMI-flow (pre): 3  Stent: 3.5 x 16 mm Promus  Percent stenosis (post): 0%   TIMI-flow (post): 3  Conclusions: Successful stenting of the LCx extending into the left main.  Recommendations: Continue dual antiplatelet therapy for at least one year and probably indefinitely   Echo 10/14: report not available [facility]  Echo 2/15: EF 72-53%, grade 2 diastolic dysfunction, mod AS  Medications:  Scheduled Meds: . antiseptic oral rinse  15 mL Mouth Rinse QID  . aspirin  81 mg Oral Daily  . budesonide-formoterol  2 puff Inhalation BID  . ceFEPime (MAXIPIME) IV  1 g Intravenous Q24H  . chlorhexidine  15 mL Mouth/Throat BID  . docusate  100 mg Oral Daily  . feeding supplement (PRO-STAT SUGAR FREE 64)  30 mL Per Tube Q1200  . feeding supplement (VITAL HIGH PROTEIN)  1,000 mL Per Tube Q24H  . heparin  5,000 Units Subcutaneous 3 times per day  . levalbuterol  0.63 mg Nebulization Q6H  . methylPREDNISolone (SOLU-MEDROL) injection  60 mg Intravenous 3 times per day  . nystatin  5 mL Oral QID  . pantoprazole sodium  40 mg Per Tube Daily  . polyethylene glycol  17 g Oral Daily  . risperiDONE  2 mg Oral BID  . simvastatin  40 mg Oral q1800  . Ticagrelor  90 mg Oral BID  . vancomycin  1,000 mg Intravenous Q24H   Continuous Infusions: . fentaNYL infusion INTRAVENOUS 150 mcg/hr (08/31/13 0835)  . insulin (NOVOLIN-R) infusion 4.7 Units/hr (08/31/13 0820)  . midazolam (VERSED) infusion 2 mg/hr (08/31/13 0800)  . norepinephrine (LEVOPHED) Adult infusion 10 mcg/min (08/31/13 0824)  . vasopressin (PITRESSIN) infusion - *FOR SHOCK* 0.03 Units/min (08/30/13 2000)   PRN Meds:.sodium chloride, fentaNYL, levalbuterol, magnesium hydroxide, nitroGLYCERIN, ondansetron (ZOFRAN) IV, sodium phosphate  Assessment/Plan: Anthony Turner is a 73 yo man PMH CAD, asbestosis, asthma/COPD, DM 2, HTN, HLD, NSTEMI on 08/18/2013. LHC mod severe-AS, high grade ostial circumblex lesion, mod-severe LAD, pulm HTN, diastolic dysfunction (grade 2), elevated PSA, h/o FOBT +.  He was admitted with sob and chest pain  this admission found to have NSTEMI, now with coag - staph bacteremia not CABG candidate.   # Atrial fibrillation  -atrial fibrillation this admission.  -Pt on Aspirin 81 mg and Brilinta 90 mg bid  -Consider Amiodarone/Diltiazem  # Coagulase negative staph bacteremia -Cultures from 08/24/13 are negative to date. Continue Vancomycin per ID. Cannot place PICC line until bacteremia clears.  -Patient is at increased risk for endocarditis of the AV.  -Plan is to complete 6 week course of antibiotics. If unable to clear bacteremia may need to consider TEE but currently this will not alter therapy.  -On Cefepime and Vancomycin   # NSTEMI/CAD -2 vessel obstructive CAD by 08/11/2013 LHC with severe ostial LCx stenosis and moderate disease in the mid LAD -He is not a good surgical candidate.  -S/P PCI of  Lcx - continue ASA and brilinta.   #hypotension  -On Levo, Vasopression. BP improved on pressors  # Moderate to severe aortic stenosis -currently not a surgical candidate   #VDRF -pt intubated for respiratory failure in setting of COPD and asbestosis, ?lung adenocarcinoma.  -PCCM following.  -Pt being treated for possible COPD exacerbation.   # COPD/asbestosis  -right lung base opacity on Abx (Vancomycin, Cefepime). Thought acute exacerbation -Symbicort, Xopenex neb, Solumedrol 60 q8 hours  -will need repeat CT in 3 months to f/u concern for possible adenocarcinoma  #Acute kidney injury  - Baseline creatinine 1.0. Cr. 1.75 today stable  -trend BMET, strict i/o, daily weights   # Hyperlipidemia    Component  Value  Date/Time    CHOL  115  08/14/2013 0330    TRIG  173*  08/30/2013 0330    HDL  17*  08/09/2013 0330    CHOLHDL  6.8  09/04/2013 0330    VLDL  35  08/30/2013 0330    LDLCALC  63  08/09/2013 0330   -Zocor 40 mg qd   # Urinary retention  -foley in place   # DM type 2 (HA1C 7.1)  -Monitor cbgs on glucose stabilizer at 4.1 units   #Normocytic anemia -trend CBC  #F/E/N   -NSL  -trend electrolytes  -Corrected Cal 8.3   #GI/DVT PX  -Protonix, Heparin, scds    LOS: 11 days   Cresenciano Genre, MD [phone] 08/31/2013, 8:47 AM   The patient was seen, examined and discussed with Cresenciano Genre, MD, IM resident and I agree with the above.   In summary, Anthony Turner is a 73 year old male with h/o CAD, COPD, asbesthosis, severe AS who was admitted with NSTEMI on 08/27/2013. His cath showed severe 2 vessel disease and severe AS, but he became bacteremic (MRSA), developed respiratory failure requiring intubation. He underwent PCI/LCX on 08/15/2013 with no improvement to his clinical status.  He possibly has endocarditis but TEE held as felt that it woudn't change management. We might consider if continues having fevers/ positive BC.  He developed a-fib with RVR this am, we will start amiodarone drip (no bolus as his BP is on 2 pressors). No anticoagulation right now, he is on ASA and Brillinta, + FOBT the last year and anemic. If he persist in a-fib, we will start anticoagulation tomorrow.   Considering his severe COPD/asbestosis, he is not a surgical candidate. If he recovers from this he might be a candidate for TAVR, but difficult to predict at this point.   Ena Dawley, Lemmie Evens 08/31/2013

## 2013-08-31 NOTE — Progress Notes (Signed)
Versed discontinued. 25 ml of Versed wasted in sink. Witnessed by Darrick Meigs, Therapist, sports. Will continue to monitor.

## 2013-09-01 ENCOUNTER — Inpatient Hospital Stay (HOSPITAL_COMMUNITY): Payer: Medicare Other

## 2013-09-01 DIAGNOSIS — I214 Non-ST elevation (NSTEMI) myocardial infarction: Secondary | ICD-10-CM

## 2013-09-01 DIAGNOSIS — I359 Nonrheumatic aortic valve disorder, unspecified: Secondary | ICD-10-CM

## 2013-09-01 DIAGNOSIS — I4891 Unspecified atrial fibrillation: Secondary | ICD-10-CM

## 2013-09-01 LAB — BLOOD GAS, ARTERIAL
Acid-base deficit: 0.4 mmol/L (ref 0.0–2.0)
Bicarbonate: 25.7 mEq/L — ABNORMAL HIGH (ref 20.0–24.0)
Drawn by: 331761
FIO2: 1 %
LHR: 32 {breaths}/min
MECHVT: 450 mL
O2 SAT: 99.6 %
PCO2 ART: 57.6 mmHg — AB (ref 35.0–45.0)
PEEP/CPAP: 10 cmH2O
Patient temperature: 98.6
TCO2: 27.4 mmol/L (ref 0–100)
pH, Arterial: 7.272 — ABNORMAL LOW (ref 7.350–7.450)
pO2, Arterial: 161 mmHg — ABNORMAL HIGH (ref 80.0–100.0)

## 2013-09-01 LAB — CBC WITH DIFFERENTIAL/PLATELET
BASOS ABS: 0 10*3/uL (ref 0.0–0.1)
Basophils Relative: 0 % (ref 0–1)
EOS ABS: 0 10*3/uL (ref 0.0–0.7)
Eosinophils Relative: 0 % (ref 0–5)
HCT: 29.4 % — ABNORMAL LOW (ref 39.0–52.0)
HEMOGLOBIN: 8.8 g/dL — AB (ref 13.0–17.0)
LYMPHS ABS: 0.8 10*3/uL (ref 0.7–4.0)
LYMPHS PCT: 3 % — AB (ref 12–46)
MCH: 28.9 pg (ref 26.0–34.0)
MCHC: 29.9 g/dL — ABNORMAL LOW (ref 30.0–36.0)
MCV: 96.7 fL (ref 78.0–100.0)
MONO ABS: 2.1 10*3/uL — AB (ref 0.1–1.0)
MONOS PCT: 8 % (ref 3–12)
NEUTROS ABS: 23.5 10*3/uL — AB (ref 1.7–7.7)
Neutrophils Relative %: 89 % — ABNORMAL HIGH (ref 43–77)
PLATELETS: 378 10*3/uL (ref 150–400)
RBC: 3.04 MIL/uL — AB (ref 4.22–5.81)
RDW: 17 % — ABNORMAL HIGH (ref 11.5–15.5)
WBC: 26.4 10*3/uL — AB (ref 4.0–10.5)

## 2013-09-01 LAB — COMPREHENSIVE METABOLIC PANEL
ALT: 79 U/L — ABNORMAL HIGH (ref 0–53)
AST: 113 U/L — AB (ref 0–37)
Albumin: 2.4 g/dL — ABNORMAL LOW (ref 3.5–5.2)
Alkaline Phosphatase: 66 U/L (ref 39–117)
BILIRUBIN TOTAL: 0.5 mg/dL (ref 0.3–1.2)
BUN: 121 mg/dL — AB (ref 6–23)
CALCIUM: 6.9 mg/dL — AB (ref 8.4–10.5)
CO2: 26 meq/L (ref 19–32)
CREATININE: 2.69 mg/dL — AB (ref 0.50–1.35)
Chloride: 104 mEq/L (ref 96–112)
GFR, EST AFRICAN AMERICAN: 25 mL/min — AB (ref >90)
GFR, EST NON AFRICAN AMERICAN: 22 mL/min — AB (ref >90)
Glucose, Bld: 173 mg/dL — ABNORMAL HIGH (ref 70–99)
Potassium: 5.5 mEq/L — ABNORMAL HIGH (ref 3.7–5.3)
Sodium: 146 mEq/L (ref 137–147)
Total Protein: 6.2 g/dL (ref 6.0–8.3)

## 2013-09-01 LAB — GLUCOSE, CAPILLARY
GLUCOSE-CAPILLARY: 154 mg/dL — AB (ref 70–99)
GLUCOSE-CAPILLARY: 128 mg/dL — AB (ref 70–99)
GLUCOSE-CAPILLARY: 154 mg/dL — AB (ref 70–99)
GLUCOSE-CAPILLARY: 161 mg/dL — AB (ref 70–99)
GLUCOSE-CAPILLARY: 155 mg/dL — AB (ref 70–99)
Glucose-Capillary: 145 mg/dL — ABNORMAL HIGH (ref 70–99)
Glucose-Capillary: 147 mg/dL — ABNORMAL HIGH (ref 70–99)
Glucose-Capillary: 153 mg/dL — ABNORMAL HIGH (ref 70–99)
Glucose-Capillary: 160 mg/dL — ABNORMAL HIGH (ref 70–99)
Glucose-Capillary: 169 mg/dL — ABNORMAL HIGH (ref 70–99)
Glucose-Capillary: 173 mg/dL — ABNORMAL HIGH (ref 70–99)

## 2013-09-01 MED ORDER — SODIUM CHLORIDE 0.9 % IV SOLN
10.0000 mg/h | INTRAVENOUS | Status: DC
  Administered 2013-09-01: 10 mg/h via INTRAVENOUS
  Filled 2013-09-01: qty 10

## 2013-09-01 MED ORDER — MORPHINE BOLUS VIA INFUSION
5.0000 mg | INTRAVENOUS | Status: DC | PRN
  Filled 2013-09-01: qty 20

## 2013-09-01 MED ORDER — MIDAZOLAM BOLUS VIA INFUSION
5.0000 mg | INTRAVENOUS | Status: DC | PRN
  Filled 2013-09-01: qty 20

## 2013-09-01 MED ORDER — METHYLPREDNISOLONE SODIUM SUCC 40 MG IJ SOLR: 20.0000 mg | INTRAMUSCULAR | Status: DC

## 2013-09-01 MED ORDER — HYDROCORTISONE NA SUCCINATE PF 100 MG IJ SOLR
50.0000 mg | Freq: Four times a day (QID) | INTRAMUSCULAR | Status: DC
  Filled 2013-09-01 (×5): qty 1

## 2013-09-01 MED ORDER — DEXTROSE 5 % IV SOLN: 10.0000 mg/h | INTRAVENOUS | Status: DC

## 2013-09-01 MED ORDER — FUROSEMIDE 10 MG/ML IJ SOLN
INTRAMUSCULAR | Status: AC
  Administered 2013-09-01: 60 mg via INTRAVENOUS
  Filled 2013-09-01: qty 8

## 2013-09-02 LAB — CULTURE, RESPIRATORY: Special Requests: NORMAL

## 2013-09-06 NOTE — Progress Notes (Signed)
Patient ID: Anthony Turner, male   DOB: 10-07-1940, 73 y.o.   MRN: [id number]  Extensive discussion with family . We discussed the poor prognosis and likely poor quality of life. Family has decided to offer full comfort care. They are aware that the patient may be transferred to palliative care floor for continued comfort care needs. They have been fully updated on the process and expectations.  [name]. Titus Mould, MD, [place] Pgr: [facility]

## 2013-09-06 NOTE — Progress Notes (Signed)
09-24-13- Resp Care note- pt suctioned and terminally extubated at [place].  Family and RN at bedside.  Pt agonally breathing post extubation.  RN remaining at bedside for comfort care.  Vent pulled from side of bed.  S Teresita Fanton rrt, rcp

## 2013-09-06 NOTE — Progress Notes (Signed)
PULMONARY / CRITICAL CARE MEDICINE  Name: Anthony Turner MRN: [id number] DOB: 02-06-1941    ADMISSION DATE:  08/30/2013 CONSULTATION DATE:  08/27/2013  REFERRING MD :  Cardiology PRIMARY SERVICE:  Cardiology  CHIEF COMPLAINT: Acute respiratory failure  BRIEF PATIENT DESCRIPTION:  73 y/o with remote tobacco use (quit 25 years ago), COPD / asthma?, asbestos exposure, sob for "years" who was treated with Levaquin for 3 days prior to admit for URI admitted 2/12 with dyspnea / chest pain.  Cardiac cath 2/13 revealed moderate to severe AS, significant CAD, moderate pulmonary hypertension and diastolic heart failure.  Developed respiratory distress in 2/19, PCCM was consulted and patient was intubated.  SIGNIFICANT EVENTS / STUDIES:  2/12 - Admitted with dyspnea and chest pain 2/13 - TTE >>> EF 92-33%, grade 2 diastolic dysfunction 0/07 - Cardiac cath >>> moderate to severe AS, significant CAD, moderate pulmonary hypertension and diastolic heart failure 6/22 - Chest CT >>> Asbestos telated pleural disease, rounded atelectasis, no evidence of lung asbestosis, posterior upper lobe GGO concerning for [facility] 2/21 - remains on vent (10 peep/60%), pressors, no weaning.  AFIB overnight 2/22 - pressors improved 2/23- amio for rvr 2/24- worsening vent needs, levophed increased  LINES / TUBES: OETT 2/19 >>> OGT 2/19 >>> L IJ CVL 2/19 >>> Foley ? >>>  CULTURES: 2/12  MRSA >>> neg 2/14  Blood >>> Staph Species (coag neg 2/2) 2/16  Blood >>>  ANTIBIOTICS: Vancomycin  2/18 >>> Cefepime 2/20>>>  INTERVAL HISTORY:  Increased levophed  VITAL SIGNS: Temp:  [98.6 F (37 C)-101.8 F (38.8 C)] 100.2 F (37.9 C) (02/24 0000) Pulse Rate:  [90-135] 111 (02/24 0300) Resp:  [13-35] 35 (02/24 0300) BP: (82-122)/(40-59) 94/48 mmHg (02/24 0300) SpO2:  [92 %-100 %] 99 % (02/24 0300) Arterial Line BP: (77-136)/(39-65) 95/48 mmHg (02/24 0300) FiO2 (%):  [50 %-100 %] 90 % (02/24 0300) Weight:  [100.1 kg (220  lb 10.9 oz)] 100.1 kg (220 lb 10.9 oz) (02/23 0413)  HEMODYNAMICS: CVP:  [12 mmHg-21 mmHg] 12 mmHg VENTILATOR SETTINGS: Vent Mode:  [-] PRVC FiO2 (%):  [50 %-100 %] 90 % Set Rate:  [24 bmp-32 bmp] 32 bmp Vt Set:  [450 mL-510 mL] 450 mL PEEP:  [8 cmH20-10 cmH20] 10 cmH20 Plateau Pressure:  [26 cmH20-41 cmH20] 34 cmH20  INTAKE / OUTPUT: Intake/Output     02/23 0701 - 02/24 0700   I.V. (mL/kg) 1446.7 (14.5)   NG/GT 1350   IV Piggyback 250   Total Intake(mL/kg) 3046.7 (30.4)   Urine (mL/kg/hr) 1323 (0.6)   Emesis/NG output 10 (0)   Total Output 1333   Net +1713.7        PHYSICAL EXAMINATION: General:  Appears chronically ill, deep rass Neuro: rass -4 HEENT:  jvd noted Cardiovascular:  RRR, 3/6 SEM at LUSB difficult to assess worsneing Lungs: ronchi diffuse increased Abdomen:  Soft, nontender, bowel sounds diminished Musculoskeletal:  Moves all extremities, edema, early foot drop Skin:  Intact, warm  LABS: CBC  Recent Labs Lab 08/29/13 0405 08/30/13 0500 08/31/13 0400  WBC 37.8* 26.6* 22.9*  HGB 10.5* 9.5* 9.2*  HCT 32.5* 30.0* 31.4*  PLT 639* 429* 362   BMET  Recent Labs Lab 08/29/13 0405 08/30/13 0500 08/31/13 0400  NA 142 142 148*  K 4.4 4.7 4.8  CL 101 103 107  CO2 25 27 29   BUN 56* 74* 85*  CREATININE 1.55* 1.75* 1.75*  GLUCOSE 198* 174* 138*   Electrolytes  Recent Labs Lab 08/29/13 0405 08/30/13 0500  08/31/13 0400  CALCIUM 7.8* 7.5* 7.3*  MG 3.4*  --   --   PHOS 4.4  --   --    Sepsis Markers  Recent Labs Lab 08/27/13 0930 08/27/13 1355 08/27/13 1848 08/09/2013 0048 09/04/2013 0455 08/29/13 0405  LATICACIDVEN  --  1.3 1.2 1.5  --   --   PROCALCITON 0.26  --   --   --  0.36 1.04   ABG  Recent Labs Lab 08/30/13 1546 08/31/13 2049 08/31/13 2213  PHART 7.391 7.316* 7.310*  PCO2ART 47.7* 59.9* 60.6*  PO2ART 60.0* 76.0* 95.0   Liver Enzymes  Recent Labs Lab 08/25/13 1042  AST 19  ALT 6  ALKPHOS 119*  BILITOT 0.4   ALBUMIN 2.8*   Cardiac Enzymes  Recent Labs Lab 08/27/13 0805 08/27/13 1015 08/27/13 1620 08/27/13 2215  TROPONINI  --  <0.30 0.81* 0.67*  PROBNP 2423.0*  --   --   --    Glucose  Recent Labs Lab 08/31/13 1923 08/31/13 2025 08/31/13 2127 08/31/13 2221 08/31/13 2320 09-03-2013 0024  GLUCAP 145* 128* 154* 147* 153* 160*   Dg Chest Port 1 View  08/31/2013   CLINICAL DATA:  73 year old male with cardiomegaly. Respiratory failure, renal failure, chronic obstructed pulmonary disease. Initial encounter.  EXAM: PORTABLE CHEST - 1 VIEW  COMPARISON:  08/30/2013 and earlier.  FINDINGS: Portable AP semi upright view at 0728 hrs. Stable endotracheal tube tip just below the clavicles. Stable left IJ central line. Stable visible enteric tube.  Extensive bilateral calcified pleural plaque. No pneumothorax or definite effusion. Superimposed confluent multilobar left lung and right lung base opacity not significantly improved. As before, much of the mediastinal contours obscured.  IMPRESSION: 1.  Stable lines and tubes. 2. No significant change in left multilobar and right lung base confluent opacity, superimposed on chronic lung disease with extensive calcified pleural plaques.   Electronically Signed   By: Lars Pinks M.D.   On: 08/31/2013 07:54   Dg Chest Port 1 View  08/30/2013   CLINICAL DATA:  Acute respiratory failure. On ventilator. COPD. Acute renal failure. Previous myocardial infarct.  EXAM: PORTABLE CHEST - 1 VIEW  COMPARISON:  08/29/2013  FINDINGS: Support lines and tubes in appropriate position. Cardiomegaly is stable. Heterogeneous bilateral interstitial and airspace opacities shows no significant change. Bilateral pleural thickening calcifications also unchanged. No pneumothorax identified.  IMPRESSION: No significant change compared with prior exam.   Electronically Signed   By: Earle Gell M.D.   On: 08/30/2013 08:27   ASSESSMENT / PLAN:  PULMONARY A:   Acute respiratory  failure Likely COPD with exacerbation - but no PFT available, not on oxygen prior to admission and has minimal pulmonary symptoms Asbestos related pleural disease - but no radiological evidence of parenchymal involvement.  Posterior upper lobe GGO concerning for [facility] Pulm edema from AS  P:   - no sbt with current vent needs, high MV, peep now 10, 90%, pao2 noted, hope to reduce to goal 70% -may need to lower rate if auto peep noted and have permissive hypercpania - Solu-Medrol IV Q8, reduce in setting limited wheezing and infection - goal balance neg with worsening vent needs -worsening pcxr awaited this am, concern is worsening edema -would consider comfort care  CARDIOVASCULAR A:   Severe CAD - s/p Cath 2/20 w stenting of L Cx extending into L main NSTEMI? 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 - Hold Bisoprolol, Imdur -lasix, consider infusion with hemodynamics -limited options valve treatment -to stress roids  RENAL A:  Worsening edema AKI, likely worsening P:   - Trend BMP, send bmet now - lasix restart consider infusion, await chem now, cvp 12  GASTROINTESTINAL A: Nutrition SUP P:   - Continue TF - Protonix.  HEMATOLOGIC A:   VTE Px Some additional rise wbc from hemoconcetration P:  - Trend CBC further with pos balance - Heparin  INFECTIOUS A:   Staph bacteremia P:   - ID following, continue current abx -follow wbc trend -consider repeat BC  ENDOCRINE  A:   DM  P:   - SSI -reduce steroids to stress as worsening shock  NEUROLOGIC A:   Acute encephalopathy delerium P:   - Goal RASS -2 - Fentanyl gtt, may need to re add versed for sycnrony - Risperdal bid initiated 2/21, maintain  Global: worsening  clinical status, appearing more that he will not survive and limited options for treatment valve, await family decisions to consider comfort vs trach, would NOT favor trach , also given worsening status overnight  I have personally obtained history, examined patient, evaluated and interpreted laboratory and imaging results, reviewed medical records, formulated assessment / plan and placed orders.  CRITICAL CARE:  The patient is critically ill with multiple organ systems failure and requires high complexity decision making for assessment and support, frequent evaluation and titration of therapies, application of advanced monitoring technologies and extensive interpretation of multiple databases. Critical Care Time devoted to patient care services described in this note is 30 minutes.   September 27, 2013, 4:02 AM  [name]. Titus Mould, MD, [place] Pgr: [facility]

## 2013-09-06 NOTE — Discharge Summary (Signed)
Death Summary   Patient ID: Anthony Turner MRN: [id number], DOB/AGE: Mar 15, 1941 73 y.o. Admit date: 08/16/2013 D/C date:     09/03/2013  Primary Cardiologist: Listed as Dr. Gwenlyn Turner  Primary Final Diagnoses:  1. Acute ventilator-dependent respiratory failure 2. Coagulase negative staph bacteremia with suspected endocarditis 3. CAD with NSTEMI this admission s/p PCI 4. Hypotension with possible septic shock 5. Moderate to severe aortic stenosis 6. Atrial fibrillation 7. COPD with exacerbation 8. Severe restrictive lung disease (asbestosis) 9. Acute kidney injury 10. Diabetes mellitus 11. Acute encephalopathy 12. Urinary retention 13. Possible healthcare associated pneumonia 14. Acute diastolic CHF 15. Pulmonary hypertension 16. Leukocytosis 17. Anemia 18. Morbid obesity BMI 35.64 kg/(m^2).  Secondary Diagnoses:  1. Hypertension 2. Hyperlipidemia 3. Arthritis 4. Asthma 5. History of diverticulitis of colon   6. Seasonal allergic rhinitis   Hospital Course: Anthony Turner was a 73 y/o M with history of COPD, asbestos exposure with chest x-ray concerning for asbestosis, hypertension, hyperlipidemia, aortic stenosis (moderate in 04/2013). Asthma, remote tobacco history, and diabetes mellitus who presented to [facility] on 09/03/2013 with increased dyspnea, PND, weight increase, orthopnea, fever, and chills. He had chest pain 2 days prior to admission which resolved when he walked around for a few hours. He was recently diagnosed with heme-positive stools felt possibly due to hemorrhoids as well as PNA for which he was taking Levaquin. Flu screen was reportedly negative at PCP's office. Initial workup was significant for mildly elevated troponin with EKG showing ST depression in I, V4 through 6, right bundle branch block. Cr was also newly elevated at 1.7 and he was felt to be dehydrated. He was admitted, placed on gentle hydration (gentle only - given concern for acute diastolic  CHF), IV heparin, and ARB/amlodipine were held. Peak troponin was 0.74. Cr improved to 1.5 with fluids. He underwent R/LHC on 08/26/2013 which showed: 1. Moderately severe to severe aortic stenosis, aortic valve area 1.16 cm square  2. Significant coronary artery disease with high-grade ostial circumflex and moderately severe mid LAD obstruction  3. Moderate pulmonary hypertension  4. Heart failure, suspect diastolic in nature. Echo is pending. End diastolic pressures and capillary wedge are significantly elevated This was a diagnostic cath only due to concomitant issues including development of fever with flank pain. Blood cultures were drawn eventually growing coag-negative staph and he was placed on antibiotics. CT abdomen/pelvis showed no acute findings but did demonstrate mild bilateral perinephric stranding.  He was also placed on oral diuretics for cautious diuresis. Infectious disease saw the patient and felt he was at high risk for aortic valve endocarditis given his aortic stenosis. Although TEE is more sensitive for endocarditis. Dr. Megan Turner did not feel the results would alter recommendations for antibiotic therapy. Given the high-risk nature of this infection, 6 weeks of antibiotics was recommended.  On 08/22/13, Dr. Prescott Turner saw the patient given his severe CAD and moderate-severe AS. The patient was not felt to be a candidate for sternotomy and combined AVR-CABG due to severe restrictive lung disease (asbestosis) and abdominal obesity. He would be at high risk for prolonged pulmonary insufficiency, tracheostomy, and poor outcome. His coronary lesions were felt suitable for PCI. The initial plan was to allow him to recover from his bacteremia, respiratory status, and renal insufficiency and bring him back as an outpatient. He did have intermittent chest pain and was started on Plavix and NTG. Beta blocker was changed to more selective bisoprolol. On 2/19 he began to have agitation, restlessness,  wheezing and SOB with posturing requiring intubation. PCCM was consulted. CXR showed edema on chronic pulmonary changes and he was started on IV Lasix. Antihypertensives were held as he required pressors for hypotension felt secondary to procedural sedation / positive pressure ventilation. F/u troponin showed a second rise to 0.81. P2Y12 was 297 indicating poor response to Plavix thus he was loaded/started on Brilinta. Anthony Turner felt now that his respiratory status was stabilized with the ventilator, this was the window for opportunity for consideration of PCI. He was brought by the cath lab and underwent successful stenting of the LCx extending into the left main. Subsequent hospital course was notable for acute encephalopathy/delirium, urinary retention, and paroxysms of atrial fibrillation. Anticoagulation was not added due to DAPT following PCI. Pulm continued to attempt weans of the ventilator but were unsuccessful. He was treated with IV steroids and also started on cefepime for possible superimposed healthcare associated pneumonia. By 2/23, respiratory status continued to worsen with bilateral infiltrates and possible ARDS. CVP was elevated but he remained on pressures due to hypotension. He exhibited multiple lab abnormalities including leukocytosis WBC 26 with mild left shift, anemia with Hgb 8.8, BUN 121 and Cr 2.69, and mild transaminitis. Anthony Turner with PCCM had an extensive discussion with the family given his poor prognosis and likely poor quality of life. The family decided to offer full comfort care. The patient was terminally extubated on 09-27-13 and passed away at 0731 with family and nursing at bedside.  Discharge Vitals: N/A  Labs: Lab Results  Component Value Date   WBC 26.4* 09-27-13   HGB 8.8* 2013-09-27   HCT 29.4* 27-Sep-2013   MCV 96.7 2013/09/27   PLT 378 09-27-13     Recent Labs Lab 09/27/2013 0433  NA 146  K 5.5*  CL 104  CO2 26  BUN 121*  CREATININE 2.69*    CALCIUM 6.9*  PROT 6.2  BILITOT 0.5  ALKPHOS 66  ALT 79*  AST 113*  GLUCOSE 173*    Lab Results  Component Value Date   CHOL 115 08/23/2013   HDL 17* 08/27/2013   LDLCALC 63 09/05/2013   TRIG 173* 08/31/2013    Diagnostic Studies/Procedures   2D Echo 08/29/2013  - Left ventricle: The cavity size was normal. Wall thickness was normal. Systolic function was normal. The estimated ejection fraction was in the range of 55% to 60%. Although no diagnostic regional wall motion abnormality was identified, this possibility cannot be completely excluded on the basis of this study. Features are consistent with a pseudonormal left ventricular filling pattern, with concomitant abnormal relaxation and increased filling pressure (grade 2 diastolic dysfunction). - Aortic valve: There was moderate stenosis. Valve area: 1.28cm^2(VTI).  Cardiac Cath 08/10/2013 CARDIAC CATH NOTE  Name: Jovoni Borkenhagen  MRN: [id number]  DOB: [birth year]  Procedure: PTCA and stenting of the Ostial LCx  Indication: 73 yo WM with NSTEMI. Prior diagnostic cath showed critical ostial LCx disease with moderate mid LAD stenosis. Clinical course complicated by acute respiratory failure, diastolic CHF, staph bacteremia and refractory angina.  Procedural Details: The right groin was prepped, draped, and anesthetized with 1% lidocaine. Using the modified Seldinger technique, a 7 Fr sheath was introduced into the right femoral artery. Weight-based bivalirudin was given for anticoagulation. Once a therapeutic ACT was achieved, a 7 [place] XB 3.5 guide catheter was inserted. A prowater coronary guidewire was used to cross the lesion. A second prowater wire was placed down the LAD. The lesion was predilated with a 2.5 mm  Angiosculpt balloon. The lesion was then stented with a 3.5 x 16 mm Promus stent covering the ostial LCx and extending into the left main. The LAD wire was then withdrawn and redirected through the stent struts into the LAD  again. The stent struts were then dilated into the LAD with a 3.0 mm Parmer balloon. The Ostial LCx and left main portion of the stent was postdilated with a 4.0 mm noncompliant balloon. Following PCI, there was 0% residual stenosis and TIMI-3 flow in both the LAD and LCx. Final angiography confirmed an excellent result. The patient tolerated the procedure well. He was agitated at times and was sedated with additional Versed and Fentanyl IV. There were no immediate procedural complications. Angiomax was discontinued post procedure and the patient was transferred to the ICU for further monitoring and removal of the sheath later. 85 cc of contrast was used.  Lesion Data:  Vessel: LCx-ostial  Percent stenosis (pre): 95%  TIMI-flow (pre): 3  Stent: 3.5 x 16 mm Promus  Percent stenosis (post): 0%  TIMI-flow (post): 3  Conclusions: Successful stenting of the LCx extending into the left main.  Recommendations: Continue dual antiplatelet therapy for at least one year and probably indefinitely.  [name]  08/23/2013, 12:29 PM  Cardiac Cath 08/29/2013 Referring Physician: Sherren Mocha, M.D.  Primary Cardiologist:: Burt Knack, M.D.  INDICATIONS: Chest pain, fever, aortic stenosis, renal insufficiency, and diastolic heart failure.  PROCEDURE: 1. Right heart catheterization; 2. Left heart catheterization; 3. Coronary angiography  CONSENT:  The risks, benefits, and details of the procedure were explained in detail to the patient. Risks including death, stroke, heart attack, kidney injury, allergy, limb ischemia, bleeding and radiation injury were discussed. The patient verbalized understanding and wanted to proceed. Informed written consent was obtained.  PROCEDURE TECHNIQUE: After Xylocaine anesthesia a 5 French slender sheath was placed in the right radial artery with a double wall needle stick. A 5 French brachial sheath was placed in the right antecubital vein in exchange for a previously placed 19-gauge  Angiocath. Both were performed using the Seldinger technique. Local Xylocaine infiltration was used. Coronary angiography was done using a 5 [place] JR 4 and JL 3.5 catheters. Left ventriculography was not performed to conserve contrast.  Right heart catheterization including an oximetry sample from the main pulmonary artery was obtained.  Left heart catheterization included obtaining a central aortic oximetry sample. The aortic valve was crossed using the Judkins right diagnostic catheter. Left ventriculography was not performed.  Hemostasis was achieved in the right radial artery with a wristband. Manual compression of antecubital vein was used for hemostasis.  MEDICATIONS: Versed 0.5 mg IV and fentanyl 25 mcg IV  CONTRAST: Total of 80 cc.  COMPLICATIONS: None  HEMODYNAMICS: Aortic pressure 96/53 mmHg; LV pressure 139/18 mmHg; LVEDP 27 mm mercury, RA 18 mmHg, RV 48/16 mmHg, PA 44/28 mmHg, PCWP(mean) 26 mm mercury mean. No significant V wave is noted., Cardiac Output 5.51 L per minute, AV gradient 43 mm mercury peak to peak and 24 mm mercury mean; Aortic valve area 1.16 cm square  ANGIOGRAPHIC DATA: The left main coronary artery is widely patent. Moderate calcification is noted..  The left anterior descending artery is heavily calcified. Eccentric moderate to severe mid stenosis is noted after the first diagonal. The regional most severe stenosis is graded at approximately 80%. 2 relatively large diagonal branches arise before the high-grade disease in the LAD.  The left circumflex artery is patent but contains 90% ostial stenosis with a hazy quality. The  distal circumflex after the origin of the second obtuse marginal contains segmental 50-70% stenosis.  The right coronary artery is is widely patent and dominant. The proximal and mid vessel is heavily calcified. Irregularities in the mid vessel obstructed to up to 50%.  LEFT VENTRICULOGRAM: Left ventricular angiogram was not performed  IMPRESSIONS: 1.  Moderately severe to severe aortic stenosis, aortic valve area 1.16 cm square  2. Significant coronary artery disease with high-grade ostial circumflex and moderately severe mid LAD obstruction  3. Moderate pulmonary hypertension  4. Heart failure, suspect diastolic in nature. Echo is pending. End diastolic pressures and capillary wedge are significantly elevated  RECOMMENDATION: Consideration of heart treatment options via a heart team approach. Issues with the patient's fever and left flank pain will need to be addressed prior to proceeding with further cardiac intervention. The patient's comorbidities including asbestosis may have some bearing on treatment options.      Ct Abdomen Pelvis Wo Contrast  09/05/2013   CLINICAL DATA:  Left-sided flank pain and fever.  EXAM: CT ABDOMEN AND PELVIS WITHOUT CONTRAST  TECHNIQUE: Multidetector CT imaging of the abdomen and pelvis was performed following the standard protocol without intravenous contrast.  COMPARISON:  No priors.  FINDINGS: Lung Bases: Extensive calcified pleural plaques throughout the lung bases bilaterally, likely related to asbestos related pleural disease. Small left pleural effusion layering dependently. Assessment of the lung parenchyma in the lung bases is limited by considerable respiratory motion, however, there is likely some scarring and perhaps developing rounded atelectasis in the right lung base, particularly in the right lower lobe. Extensive aortic valve calcifications. Atherosclerotic calcifications are noted in the left anterior descending, left circumflex and right coronary arteries. Small hiatal hernia.  Abdomen/Pelvis: Iodinated contrast material is noted in the collecting systems of the kidneys bilaterally, the ureters bilaterally and within the lumen of the urinary bladder, related to recent cath lab procedure. Mild bilateral perinephric stranding. Low attenuation throughout the hepatic parenchyma, compatible with hepatic  steatosis. The unenhanced appearance of the gallbladder, pancreas and bilateral adrenal glands is unremarkable. Spleen is enlarged measuring 10.4 x 16.5 x 15.4 cm. Extensive atherosclerosis throughout the abdominal and pelvic vasculature, without evidence of aneurysm. Numerous colonic diverticulae are noted, without surrounding inflammatory changes to suggest an acute diverticulitis at this time. No significant volume of ascites. No pneumoperitoneum. No pathologic distention of small bowel. No definite lymphadenopathy identified within the abdomen or pelvis on today's non contrast CT examination.  Musculoskeletal: There are no aggressive appearing lytic or blastic lesions noted in the visualized portions of the skeleton.  IMPRESSION: 1. No acute findings in the abdomen or pelvis to account for the patient's symptoms. 2. Colonic diverticulosis without findings to suggest acute diverticulitis at this time. 3. Splenomegaly. 4. Asbestos related pleural disease. 5. Trace left pleural effusion. 6. Extensive atherosclerosis, including at least 3 vessel coronary artery disease. 7. There are calcifications of the aortic valve. Echocardiographic correlation for evaluation of potential valvular dysfunction may be warranted if clinically indicated. 8. Small hiatal hernia. 9. Hepatic steatosis. 10. Additional incidental findings, as above.   Electronically Signed   By: Vinnie Langton M.D.   On: 08/27/2013 16:25   Dg Orthopantogram  08/22/2013   CLINICAL DATA:  Bacteremia  EXAM: ORTHOPANTOGRAM/PANORAMIC  COMPARISON:  None.  FINDINGS: Mandibular rami are unremarkable. The condyles are incompletely included in the field of view. No gross evidence for fracture. Dental amalgam noted at multiple locations.  IMPRESSION: No gross evidence for mandibular fracture.   Electronically Signed  By: Conchita Paris M.D.   On: 08/22/2013 20:40   Dg Chest 2 View  08/30/2013   CLINICAL DATA:  Shortness of breath, history of COPD, smoking  history  EXAM: CHEST  2 VIEW  COMPARISON:  DG CHEST 2 VIEW dated 08/18/2013  FINDINGS: Extensive bilateral calcification of pleural plaques, unchanged. Moderate fibrotic change bilaterally involving the lung parenchyma is stable. There is no acute opacification that was not present previously. There are no pleural effusions. Heart size is mildly enlarged and stable.  IMPRESSION: No acute cardiopulmonary process. Stable chronic changes suggesting asbestos exposure.   Electronically Signed   By: Skipper Cliche M.D.   On: 08/27/2013 11:35   Dg Chest 2 View  08/18/2013   CLINICAL DATA:  Fever.  The symptoms.  EXAM: CHEST  2 VIEW  COMPARISON:  None.  FINDINGS: There are prominent chronic changes of asbestos exposure with densely calcified pleural plaques extending throughout both hemithoraces, sparing the apices. There is coarse reticular opacity in the lungs that suggests associated fibrosis. No convincing pneumonia. No pleural effusion or pneumothorax.  The cardiac silhouette is mildly enlarged normal mediastinal and hilar contours.  Bony thorax is demineralized but intact.  IMPRESSION: 1. No convincing acute cardiopulmonary disease. However, infiltrate superimposed on the extensive chronic changes is not excluded, particularly in the left upper lobe. 2. Chronic changes include extensive calcified pleural plaque and evidence of interstitial fibrosis with the combination of findings suggesting asbestosis.   Electronically Signed   By: Lajean Manes M.D.   On: 08/18/2013 15:19   Dg Abd 1 View  08/25/2013   CLINICAL DATA:  Abdominal pain.  EXAM: ABDOMEN - 1 VIEW  COMPARISON:  CT ABD/PELV WO CM dated 08/25/2013  FINDINGS: The bowel gas pattern is normal. No soft tissue abnormalities are identified. No abnormal calcifications are seen by radiography. Degenerative changes of the spine are present.  IMPRESSION: Normal bowel gas pattern.   Electronically Signed   By: Aletta Edouard M.D.   On: 08/25/2013 10:51   Ct  Chest Wo Contrast  08/23/2013   CLINICAL DATA:  History of asbestos exposure. Evaluate for potential asbestosis.  EXAM: CT CHEST WITHOUT CONTRAST  TECHNIQUE: Multidetector CT imaging of the chest was performed following the standard protocol without IV contrast.  COMPARISON:  CT of the abdomen and pelvis 08/18/2013.  FINDINGS: Mediastinum: Heart size is mildly enlarged. There is no significant pericardial fluid, thickening or pericardial calcification. Multiple borderline enlarged mediastinal lymph nodes. No definite pathologic mediastinal or hilar nodal enlargement. Please note that accurate exclusion of hilar adenopathy is limited on noncontrast CT scans. Small hiatal hernia with some circumferential thickening of the distal third of the esophagus. There is atherosclerosis of the thoracic aorta, the great vessels of the mediastinum and the coronary arteries, including calcified atherosclerotic plaque in the left main, left anterior descending, left circumflex and right coronary arteries. Severe calcifications of the aortic valve.  Lungs/Pleura: Extensive calcified pleural plaque is again noted throughout the thorax bilaterally, compatible with asbestos related pleural disease. Associated with this there are pleural-based areas of architectural distortion, some of which are slightly masslike, most notable in the right lower lobe and the left upper lobe, favored to reflect areas of developing rounded atelectasis. Several areas of ground-glass attenuation are seen scattered throughout the lungs bilaterally, which are nonspecific, largest of which is in the posterior aspect of the left upper lobe (image 16 of series 3 and image 76 of series [id number]) measuring approximately 2.3 x 1.4 x  3.1 cm. No definite areas of subpleural reticulation, traction bronchiectasis or frank honeycombing are identified at this time.  Upper Abdomen: Referred to contemporaneous CT of the abdomen and pelvis 08/12/2013 for full description of  abdominal findings.  Musculoskeletal: There are no aggressive appearing lytic or blastic lesions noted in the visualized portions of the skeleton.  IMPRESSION: 1. Severe asbestos related pleural disease with multifocal areas of probable developing rounded atelectasis, as detailed above. 2. No definite imaging findings at this time to strongly suggest asbestosis in the lungs, however, given the extent of multifocal ground-glass attenuation, early changes of asbestosis is not excluded, and followup high-resolution chest CT is recommended to assess for temporal changes in the appearance of the lung parenchyma. 3. Amongst the areas of ground-glass attenuation there is one that is slightly mass like in appearance. This is nonspecific, but measures approximately 3.1 x 1.4 x 2.3 cm in the posterior aspect of the left upper lobe. The possibility of an adenocarcinoma is not excluded, and repeat evaluation with high-resolution chest CT in 3 months is recommended at this time to confirm persistence and assess stability. This recommendation follows the consensus statement: Recommendations for the Management of Subsolid Pulmonary Nodules Detected at CT: A Statement from the Fleischner Society as published in Radiology 2013; 266:304-317. 4. Atherosclerosis, including left main and 3 vessel coronary artery disease. Assessment for potential risk factor modification, dietary therapy or pharmacologic therapy may be warranted, if clinically indicated. 5. There are calcifications of the aortic valve. Echocardiographic correlation for evaluation of potential valvular dysfunction may be warranted if clinically indicated. 6. Mild cardiomegaly.   Electronically Signed   By: Vinnie Langton M.D.   On: 08/23/2013 12:14   Dg Chest Port 1 View  09/10/2013   CLINICAL DATA:  Intubated patient.  EXAM: PORTABLE CHEST - 1 VIEW  COMPARISON:  Plain film of the chest 08/30/2013 and 08/31/2013.  FINDINGS: Support tubes and lines are unchanged.  Extensive bilateral airspace disease persists, also without change. Calcified pleural plaques are again identified. Heart size is upper normal.  IMPRESSION: No change in diffuse bilateral airspace disease.   Electronically Signed   By: Inge Rise M.D.   On: 09/10/2013 07:42   Dg Chest Port 1 View  08/31/2013   CLINICAL DATA:  73 year old male with cardiomegaly. Respiratory failure, renal failure, chronic obstructed pulmonary disease. Initial encounter.  EXAM: PORTABLE CHEST - 1 VIEW  COMPARISON:  08/30/2013 and earlier.  FINDINGS: Portable AP semi upright view at 0728 hrs. Stable endotracheal tube tip just below the clavicles. Stable left IJ central line. Stable visible enteric tube.  Extensive bilateral calcified pleural plaque. No pneumothorax or definite effusion. Superimposed confluent multilobar left lung and right lung base opacity not significantly improved. As before, much of the mediastinal contours obscured.  IMPRESSION: 1.  Stable lines and tubes. 2. No significant change in left multilobar and right lung base confluent opacity, superimposed on chronic lung disease with extensive calcified pleural plaques.   Electronically Signed   By: Lars Pinks M.D.   On: 08/31/2013 07:54   Dg Chest Port 1 View  08/30/2013   CLINICAL DATA:  Acute respiratory failure. On ventilator. COPD. Acute renal failure. Previous myocardial infarct.  EXAM: PORTABLE CHEST - 1 VIEW  COMPARISON:  08/29/2013  FINDINGS: Support lines and tubes in appropriate position. Cardiomegaly is stable. Heterogeneous bilateral interstitial and airspace opacities shows no significant change. Bilateral pleural thickening calcifications also unchanged. No pneumothorax identified.  IMPRESSION: No significant change compared with prior  exam.   Electronically Signed   By: Earle Gell M.D.   On: 08/30/2013 08:27   Dg Chest Port 1 View  08/29/2013   CLINICAL DATA:  Endotracheal tube placement.  EXAM: PORTABLE CHEST - 1 VIEW  COMPARISON:   08/27/2013  FINDINGS: Endotracheal tube is unchanged with tip at the inferior margin of the clavicular heads, well above the carina. Left jugular central venous catheter is unchanged with tip overlying the mid upper SVC enteric tube courses into the left upper abdomen with tip not imaged. Cardiomediastinal silhouette is grossly unchanged. Extensive calcified pleural plaques are again seen. Lung volumes are slightly smaller than on the prior study. Extensive bilateral airspace disease overall does not appear significantly changed. No definite pleural effusion or pneumothorax is identified.  IMPRESSION: Unchanged appearance of support lines and tubes. No significant interval change in diffuse bilateral lung opacities.   Electronically Signed   By: Logan Bores   On: 08/29/2013 07:32   Dg Chest Port 1 View  09/04/2013   CLINICAL DATA:  Blood in endotracheal tube.  EXAM: PORTABLE CHEST - 1 VIEW  COMPARISON:  DG CHEST 1V PORT dated 09/05/2013; DG CHEST 1V PORT dated 08/27/2013; CT CHEST W/O CM dated 08/23/2013  FINDINGS: Endotracheal tube tip is approximately 3 cm above the carina. Central line and gastric decompression to shows stable positioning. Extensive bilateral airspace disease and edema again identified. Heavily calcified bilateral pleural plaques are also again visualized. The heart size is stable. No pneumothorax or significant pleural fluid identified.  IMPRESSION: Stable diffuse pulmonary edema and bilateral airspace disease.   Electronically Signed   By: Aletta Edouard M.D.   On: 08/13/2013 15:35   Dg Chest Port 1 View  08/10/2013   CLINICAL DATA:  Intubation, history of COPD  EXAM: PORTABLE CHEST - 1 VIEW  COMPARISON:  Portable chest x-ray of 08/27/2013  FINDINGS: The tip of the endotracheal tube is approximately 5.2 cm above the carina. Diffuse airspace disease is stable. The does appear to be calcification of the hemidiaphragms with some calcified pleural plaques indicating asbestos related disease.  Mild cardiomegaly is stable. Left IJ central venous line tip is seen to the mid proximal SVC  IMPRESSION: 1. No change in diffuse airspace disease. 2. Tip of endotracheal tube approximately 5.2 cm above the carinal.   Electronically Signed   By: Ivar Drape M.D.   On: 09/04/2013 10:45   Dg Chest Port 1 View  08/27/2013   CLINICAL DATA:  Status post left central line placement  EXAM: PORTABLE CHEST - 1 VIEW  COMPARISON:  08/27/2013 804 hrs  FINDINGS: Cardiac shadow remains enlarged. An endotracheal tube is now seen with the tip 5.6 cm above the carina. A new left central venous line is noted with the tip in the proximal superior vena cava. No definitive pneumothorax is seen. A nasogastric catheter extends into the stomach beneath the edge of the film. Diffuse pleural calcifications are again seen bilaterally. Patchy interstitial changes are again identified stable from the prior exam. The linear density is noted overlying the mid left cardiac border stable from the prior exam. This is likely related to the pleural calcifications. No other focal abnormality is seen.  IMPRESSION: Chronic changes in the lungs bilaterally.  Endotracheal tube, nasogastric catheter and left central venous line in satisfactory position. No pneumothorax is noted.   Electronically Signed   By: Inez Catalina M.D.   On: 08/27/2013 11:23   Dg Chest Port 1v Same Day  08/27/2013  CLINICAL DATA:  Shortness of Breath  EXAM: PORTABLE CHEST - 1 VIEW SAME DAY  COMPARISON:  August 23, 2013 chest CT and August 25, 2013 chest radiograph  FINDINGS: There is extensive pleural and diaphragmatic calcification consistent with prior asbestos exposure. There is diffuse interstitial disease bilaterally which may be due to to acute interstitial edema superimposed on chronic interstitial disease, likely due to asbestosis. There is also underlying emphysematous change.  Heart is enlarged. The pulmonary vascularity reflects underlying emphysema.   IMPRESSION: The overall appearance is felt to be secondary to congestive heart failure superimposed on emphysema and underlying asbestos exposure/ asbestosis. The appearance is stable compared to 2 days prior.   Electronically Signed   By: Lowella Grip M.D.   On: 08/27/2013 08:13   Dg Chest Port 1v Same Day  08/25/2013   CLINICAL DATA:  Shortness of breath and dyspnea  EXAM: PORTABLE CHEST - 1 VIEW SAME DAY  COMPARISON:  08/23/2013  FINDINGS: The heart size is mild to moderately enlarged. There is extensive asbestos related pleural disease bilaterally. Superimposed airspace opacities are identified within the right midlung and left base. There is also mild interstitial edema noted.  IMPRESSION: 1. Bilateral airspace opacities and mild interstitial edema is superimposed upon extensive bilateral asbestos related pleural disease.   Electronically Signed   By: Kerby Moors M.D.   On: 08/25/2013 10:50   Dg Abd Portable 1v  08/17/2013   CLINICAL DATA:  Blood in enteric tube  EXAM: PORTABLE ABDOMEN - 1 VIEW  COMPARISON:  DG ABD 1 VIEW dated 08/25/2013; CT ABD/PELV WO CM dated 09/05/2013; DG CHEST 1V PORT dated 08/13/2013; CT CHEST W/O CM dated 08/23/2013; DG CHEST 2 VIEW dated 08/29/2013  FINDINGS: Enteric tube tip and side port overlie the expected location of the descending duodenum.  Paucity of bowel gas without definite evidence of obstruction. No supine evidence of pneumoperitoneum. No definite pneumatosis or portal venous gas.  Limited visualization of lower thorax demonstrates extensive bilateral pleural calcifications and heterogeneous airspace opacities within image bilateral lung bases.  IMPRESSION: 1. Enteric tube tip and side port projected over the expected location of the descending duodenum. 2. Nonobstructive bowel gas pattern. 3. Sequela prior asbestos exposure with extensive heterogeneous airspace opacity within the imaged bilateral lung bases. Please refer to dedicated chest radiograph performed  earlier same day.   Electronically Signed   By: Sandi Mariscal M.D.   On: 08/25/2013 15:35    Discharge Medications  N/a  Disposition   The patient passed away on Sep 09, 2013    Duration of Discharge Encounter: Greater than 30 minutes including physician and PA time.  Signed, Melina Copa PA-C September 09, 2013, 4:45 PM

## 2013-09-06 NOTE — Progress Notes (Signed)
Chaplain responded but pt passed earlier in the morning.  No family present at the time.

## 2013-09-06 NOTE — Progress Notes (Addendum)
Patient is without spontaneous respirations and pulseless.  Absence of heart tones assessed by 2 RN's x 1 minute. Patient expired at 48 with family at bedside. MD made aware.

## 2013-09-06 NOTE — Progress Notes (Signed)
Fentanyl 175 cc wasted in sink. Morphine 50 cc wasted in sink. Versed 25 cc wasted in sink. All wastes witnessed by Kathyrn Drown, RN

## 2013-09-06 NOTE — Discharge Summary (Signed)
Ena Dawley, Lemmie Evens 09/03/2013

## 2013-09-06 NOTE — Progress Notes (Signed)
[facility] progress Note  Subjective: Intubated/sedated. Pt is now comfort care.   RN: HR 135.  BP 82/40  Objective:  Vital signs in last 24 hours: Filed Vitals:   09-24-13 0400 24-Sep-2013 0455 09-24-13 0500 24-Sep-2013 0600  BP:      Pulse: 108 130 104 77  Temp: 99.7 F (37.6 C)     TempSrc: Oral     Resp: 32 33 30 27  Height:      Weight:      SpO2: 95% 94% 96% 90%   Weight change:   Intake/Output Summary (Last 24 hours) at 09-24-13 0700 Last data filed at 24-Sep-2013 0600  Gross per 24 hour  Intake 3402.47 ml  Output   1333 ml  Net 2069.47 ml   Vitals reviewed. Physical exam No physical exam today patient is comfort care and family in the room praying Neuro: intubated sedated   Lab Results: Basic Metabolic Panel:  Recent Labs Lab 08/19/2013 0455  08/29/13 0405  08/31/13 0400 09-24-2013 0433  NA 140  --  142  < > 148* 146  K 4.6  --  4.4  < > 4.8 5.5*  CL 97  --  101  < > 107 104  CO2 27  --  25  < > 29 26  GLUCOSE 239*  --  198*  < > 138* 173*  BUN 42*  --  56*  < > 85* 121*  CREATININE 1.42*  < > 1.55*  < > 1.75* 2.69*  CALCIUM 7.9*  --  7.8*  < > 7.3* 6.9*  MG  --   --  3.4*  --   --   --   PHOS  --   --  4.4  --   --   --   < > = values in this interval not displayed. Liver Function Tests:  Recent Labs Lab 08/25/13 1042 09/24/13 0433  AST 19 113*  ALT 6 79*  ALKPHOS 119* 66  BILITOT 0.4 0.5  PROT 6.7 6.2  ALBUMIN 2.8* 2.4*   CBC:  Recent Labs Lab 08/25/13 1042  08/31/13 0400 Sep 24, 2013 0433  WBC 15.5*  < > 22.9* 26.4*  NEUTROABS 13.1*  --   --  PENDING  HGB 10.2*  < > 9.2* 8.8*  HCT 30.3*  < > 31.4* 29.4*  MCV 84.9  < > 94.3 96.7  PLT 191  < > 362 378  < > = values in this interval not displayed. Cardiac Enzymes:  Recent Labs Lab 08/27/13 1015 08/27/13 1620 08/27/13 2215  TROPONINI <0.30 0.81* 0.67*   BNP:  Recent Labs Lab 08/27/13 0805  PROBNP 2423.0*   CBG:  Recent Labs Lab 08/31/13 2320 2013-09-24 0024 2013/09/24 0125  09-24-2013 0225 24-Sep-2013 0322 2013-09-24 0417  GLUCAP 153* 160* 169* 161* 155* 173*   Fasting Lipid Panel: Lipid Panel     Component Value Date/Time   CHOL 115 08/24/2013 0330   TRIG 173* 08/29/2013 0330   HDL 17* 09/03/2013 0330   CHOLHDL 6.8 08/12/2013 0330   VLDL 35 08/17/2013 0330   LDLCALC 63 08/29/2013 0330    Misc. Labs: resp and blood cultures  Micro Results: Recent Results (from the past 240 hour(s))  CULTURE, BLOOD (ROUTINE X 2)     Status: None   Collection Time    08/24/13 10:05 AM      Result Value Ref Range Status   Specimen Description BLOOD RIGHT ARM   Final   Special Requests BOTTLES DRAWN  AEROBIC ONLY 10CC   Final   Culture  Setup Time     Final   Value: 08/24/2013 13:56     Performed at [facility]   Culture     Final   Value: NO GROWTH 5 DAYS     Performed at [facility]   Report Status 08/30/2013 FINAL   Final  CULTURE, BLOOD (ROUTINE X 2)     Status: None   Collection Time    08/24/13 12:30 PM      Result Value Ref Range Status   Specimen Description BLOOD LEFT HAND   Final   Special Requests BOTTLES DRAWN AEROBIC ONLY [facility]   Final   Culture  Setup Time     Final   Value: 08/24/2013 16:16     Performed at [facility]   Culture     Final   Value: NO GROWTH 5 DAYS     Performed at [facility]   Report Status 08/30/2013 FINAL   Final     Results for [name], Anthony Turner (MRN [id number]) as of 08/31/2013 09:04  Ref. Range 08/25/2013 10:42 08/27/2013 08:05 08/27/2013 10:15 08/27/2013 16:20 08/27/2013 22:15  Troponin I Latest Range: <0.30 ng/mL <0.30  <0.30 0.81 (HH) 0.67 (HH)  Pro B Natriuretic peptide (BNP) Latest Range: 0-125 pg/mL  2423.0 (H)      Results for Anthony Turner, Anthony Turner (MRN [id number]) as of 08/31/2013 09:04  Ref. Range 08/11/2013 11:46 08/10/2013 12:03 08/23/2013 14:38 08/11/2013 21:30 09/03/2013 03:30  Troponin I Latest Range: <0.30 ng/mL   0.74 (HH) 0.63 (HH) 0.37 (HH)  Troponin i, poc Latest Range: 0.00-0.08 ng/mL  0.40  (HH)     Pro B Natriuretic peptide (BNP) Latest Range: 0-125 pg/mL 679.3 (H)       Studies/Results: Dg Chest Port 1 View  08/31/2013   CLINICAL DATA:  73 year old male with cardiomegaly. Respiratory failure, renal failure, chronic obstructed pulmonary disease. Initial encounter.  EXAM: PORTABLE CHEST - 1 VIEW  COMPARISON:  08/30/2013 and earlier.  FINDINGS: Portable AP semi upright view at 0728 hrs. Stable endotracheal tube tip just below the clavicles. Stable left IJ central line. Stable visible enteric tube.  Extensive bilateral calcified pleural plaque. No pneumothorax or definite effusion. Superimposed confluent multilobar left lung and right lung base opacity not significantly improved. As before, much of the mediastinal contours obscured.  IMPRESSION: 1.  Stable lines and tubes. 2. No significant change in left multilobar and right lung base confluent opacity, superimposed on chronic lung disease with extensive calcified pleural plaques.   Electronically Signed   By: Anthony Turner M.D.   On: 08/31/2013 07:54   08/23/13 CT chest  FINDINGS:  Mediastinum: Heart size is mildly enlarged. There is no significant  pericardial fluid, thickening or pericardial calcification. Multiple  borderline enlarged mediastinal lymph nodes. No definite pathologic  mediastinal or hilar nodal enlargement. Please note that accurate  exclusion of hilar adenopathy is limited on noncontrast CT scans.  Small hiatal hernia with some circumferential thickening of the  distal third of the esophagus. There is atherosclerosis of the  thoracic aorta, the great vessels of the mediastinum and the  coronary arteries, including calcified atherosclerotic plaque in the  left main, left anterior descending, left circumflex and right  coronary arteries. Severe calcifications of the aortic valve.  Lungs/Pleura: Extensive calcified pleural plaque is again noted  throughout the thorax bilaterally, compatible with asbestos related   pleural disease. Associated with this there are pleural-based areas  of architectural  distortion, some of which are slightly masslike,  most notable in the right lower lobe and the left upper lobe,  favored to reflect areas of developing rounded atelectasis. Several  areas of ground-glass attenuation are seen scattered throughout the  lungs bilaterally, which are nonspecific, largest of which is in the  posterior aspect of the left upper lobe (image 16 of series 3 and  image 76 of series 80456) measuring approximately 2.3 x 1.4 x 3.1  cm. No definite areas of subpleural reticulation, traction  bronchiectasis or frank honeycombing are identified at this time.  Upper Abdomen: Referred to contemporaneous CT of the abdomen and  pelvis 08/11/2013 for full description of abdominal findings.  Musculoskeletal: There are no aggressive appearing lytic or blastic  lesions noted in the visualized portions of the skeleton.  IMPRESSION:  1. Severe asbestos related pleural disease with multifocal areas of  probable developing rounded atelectasis, as detailed above.  2. No definite imaging findings at this time to strongly suggest  asbestosis in the lungs, however, given the extent of multifocal  ground-glass attenuation, early changes of asbestosis is not  excluded, and followup high-resolution chest CT is recommended to  assess for temporal changes in the appearance of the lung  parenchyma.  3. Amongst the areas of ground-glass attenuation there is one that  is slightly mass like in appearance. This is nonspecific, but  measures approximately 3.1 x 1.4 x 2.3 cm in the posterior aspect of  the left upper lobe. The possibility of an adenocarcinoma is not  excluded, and repeat evaluation with high-resolution chest CT in 3  months is recommended at this time to confirm persistence and assess  stability. This recommendation follows the consensus statement:  Recommendations for the Management of Subsolid  Pulmonary Nodules  Detected at CT: A Statement from the Fleischner Society as published  in Radiology 2013; 266:304-317.  4. Atherosclerosis, including left main and 3 vessel coronary artery  disease. Assessment for potential risk factor modification, dietary  therapy or pharmacologic therapy may be warranted, if clinically  indicated.  5. There are calcifications of the aortic valve. Echocardiographic  correlation for evaluation of potential valvular dysfunction may be  warranted if clinically indicated.  6. Mild cardiomegaly.   09/04/2013 CT ab/pelvis  IMPRESSION:  1. No acute findings in the abdomen or pelvis to account for the  patient's symptoms.  2. Colonic diverticulosis without findings to suggest acute  diverticulitis at this time.  3. Splenomegaly.  4. Asbestos related pleural disease.  5. Trace left pleural effusion.  6. Extensive atherosclerosis, including at least 3 vessel coronary  artery disease.  7. There are calcifications of the aortic valve. Echocardiographic  correlation for evaluation of potential valvular dysfunction may be  warranted if clinically indicated.  8. Small hiatal hernia.  9. Hepatic steatosis.  10. Additional incidental findings, as above.   08/27/2013 cath  Procedural Details: The right groin was prepped, draped, and anesthetized with 1% lidocaine. Using the modified Seldinger technique, a 7 Fr sheath was introduced into the right femoral artery. Weight-based bivalirudin was given for anticoagulation. Once a therapeutic ACT was achieved, a 7 [place] XB 3.5 guide catheter was inserted. A prowater coronary guidewire was used to cross the lesion. A second prowater wire was placed down the LAD. The lesion was predilated with a 2.5 mm Angiosculpt balloon. The lesion was then stented with a 3.5 x 16 mm Promus stent covering the ostial LCx and extending into the left main. The LAD wire was  then withdrawn and redirected through the stent struts into the LAD again.  The stent struts were then dilated into the LAD with a 3.0 mm Navarro balloon. The Ostial LCx and left main portion of the stent was postdilated with a 4.0 mm noncompliant balloon. Following PCI, there was 0% residual stenosis and TIMI-3 flow in both the LAD and LCx. Final angiography confirmed an excellent result. The patient tolerated the procedure well. He was agitated at times and was sedated with additional Versed and Fentanyl IV. There were no immediate procedural complications. Angiomax was discontinued post procedure and the patient was transferred to the ICU for further monitoring and removal of the sheath later. 85 cc of contrast was used.  Lesion Data:  Vessel: LCx-ostial  Percent stenosis (pre): 95%  TIMI-flow (pre): 3  Stent: 3.5 x 16 mm Promus  Percent stenosis (post): 0%  TIMI-flow (post): 3  Conclusions: Successful stenting of the LCx extending into the left main.  Recommendations: Continue dual antiplatelet therapy for at least one year and probably indefinitely   Echo 10/14: report not available [facility]  Echo 2/15: EF 30-86%, grade 2 diastolic dysfunction, mod AS  Medications:  Scheduled Meds:   Continuous Infusions: . fentaNYL infusion INTRAVENOUS 150 mcg/hr (09-28-2013 0026)  . midazolam (VERSED) infusion 10 mg/hr (09/28/13 0601)  . morphine 10 mg/hr (September 28, 2013 0600)  . norepinephrine (LEVOPHED) Adult infusion 35 mcg/min (September 28, 2013 0455)  . vasopressin (PITRESSIN) infusion - *FOR SHOCK* 0.03 Units/min (08/31/13 1500)   PRN Meds:.midazolam, morphine  Assessment/Plan: Anthony Turner is a 73 yo man PMH CAD, asbestosis, asthma/COPD, DM 2, HTN, HLD, NSTEMI on 08/22/2013. LHC mod severe-AS, high grade ostial circumblex lesion, mod-severe LAD, pulm HTN, diastolic dysfunction (grade 2), elevated PSA, h/o FOBT +.  He was admitted with sob and chest pain this admission found to have NSTEMI, now with coag - staph bacteremia not CABG candidate.   # Atrial fibrillation  -atrial fibrillation this  admission. CHADS VASC 3  -Aspirin 81 mg and Brilinta 90 mg bid, Amiodarone d/c'ed as pt now comfort care   # Coagulase negative staph bacteremia -Cultures from 08/24/13 are negative to date. Continue Vancomycin per ID. Cannot place PICC line until bacteremia clears.  -Patient is at increased risk for endocarditis of the AV. No TEE for now. Pt now CC -Cefepime and Vancomycin discontinued 2/23   # NSTEMI/CAD -2 vessel obstructive CAD by 08/31/2013 LHC with severe ostial LCx stenosis and moderate disease in the mid LAD -He is not a good surgical candidate (i.e CABG). Prob not good TAVR candidate as well  -S/P PCI of Lcx  #hypotension  -On Levo, Vasopression.  # Moderate to severe aortic stenosis -currently not a surgical candidate   #VDRF -pt now comfort care   # COPD/asbestosis  -was on Neb, steroids, Abx pt now comfort care   #Acute kidney injury  - Baseline creatinine 1.0. Cr. Worsening  --pt now Comfort Care   # Urinary retention  -foley in place   #F/E/N  -NSL  -trend electrolytes (HyperK 5.5, Corrected Ca 8.5) -NPO  #GI/DVT PX  -scds   Code status DNR. Patient now comfort care on Fentanyl, Versed, Morphine gtt    LOS: 12 days   Cresenciano Genre, MD [phone] 2013-09-28, 7:00 AM  The patient was seen, examined and discussed with Cresenciano Genre, MD, IM resident and I agree with the above.   In summary, Anthony Turner was a 73 year old male with h/o CAD, COPD, asbesthosis, severe AS who  was admitted with NSTEMI on 08/29/2013. His cath showed severe 2 vessel disease and severe AS, but he became bacteremic (MRSA), developed respiratory failure requiring intubation. He underwent PCI/LCX on 08/13/2013 with no improvement to his clinical status. He possibly had endocarditis, but TEE was held as it was felt that it woudn't change management. The patient developed atrial fibrillation with RVR yesterday and was started on amiodarone drip (no bolus as his BP was low on 2 pressors). No  anticoagulation was used.  The patient's overall prognosis was very poor with h/o severe COPD and asbestosis, severe aortic stenosis, NSTEMI, superimposed bacteremia with possible endocarditis, and respiratory failure. The family decided to withdraw care yesterday.  The patient passed away this morning at 7:31 am surrounded by family members.  Ena Dawley, Lemmie Evens 2013/09/21

## 2013-09-06 NOTE — Progress Notes (Signed)
Patient ID: Anthony Turner, male   DOB: 08/21/1940, 73 y.o.   MRN: [id number] I have had extensive discussions with family wife. We discussed patients current circumstances and organ failures. We also discussed patient's prior wishes under circumstances such as this. Family has decided to NOT perform resuscitation if arrest but to continue current medical support for now. They will come in now as worsening status Will then discuss comfort  [name]. Titus Mould, MD, [place] Pgr: [facility]

## 2013-09-06 DEATH — deceased

## 2013-09-14 ENCOUNTER — Telehealth: Payer: Self-pay | Admitting: Family Medicine

## 2013-09-14 NOTE — Telephone Encounter (Signed)
Called to express my condolences, spoke with wife.

## 2013-10-19 ENCOUNTER — Ambulatory Visit: Payer: Medicare Other | Admitting: Family Medicine

## 2014-06-17 ENCOUNTER — Encounter (HOSPITAL_COMMUNITY): Payer: Self-pay | Admitting: Interventional Cardiology

## 2015-02-18 IMAGING — CT CT ABD-PELV W/O CM
2 of 4 series · 16 of 46 positions shown, 18 images · non-contrast
Comparison: No priors.

CLINICAL DATA: Left-sided flank pain and fever.

EXAM:
CT ABDOMEN AND PELVIS WITHOUT CONTRAST
TECHNIQUE: Multidetector CT imaging of the abdomen and pelvis was performed
following the standard protocol without intravenous contrast.

[Series 2: stone study 5.0 i30f 1 · axial · 0.91mm/px · z∈[-1465,-1025]mm · 13 of 98 slices shown, 15 images]
[im 5/98  soft-tissue]
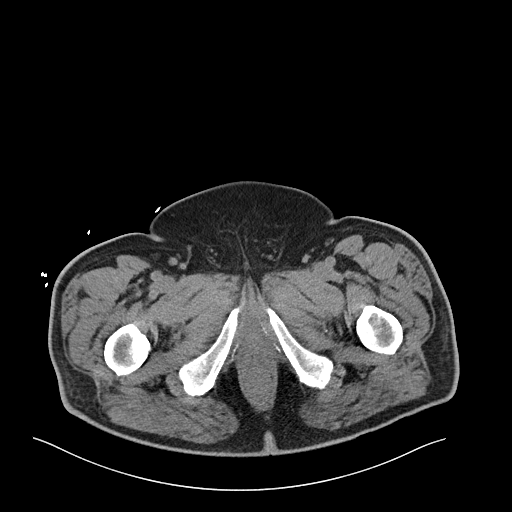
[im 5/98  bone]
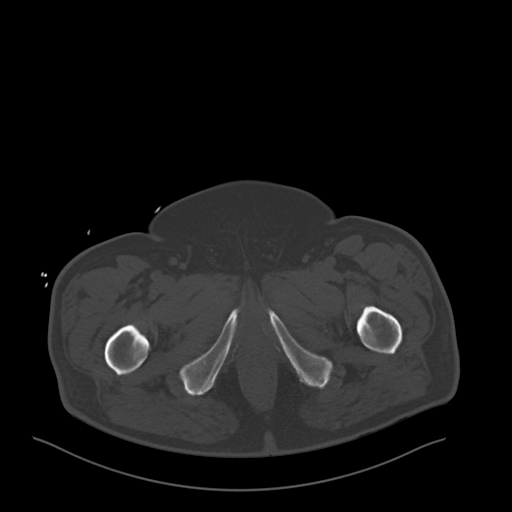
[im 13/98  soft-tissue]
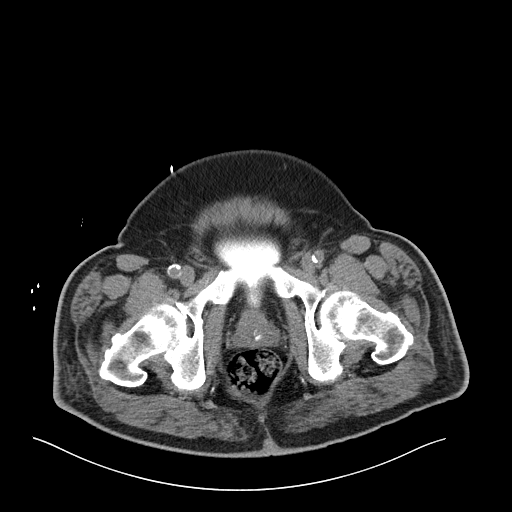
[im 21/98  soft-tissue]
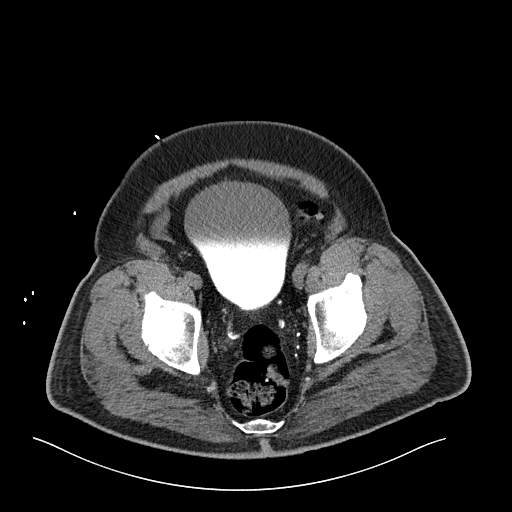
[im 29/98  soft-tissue]
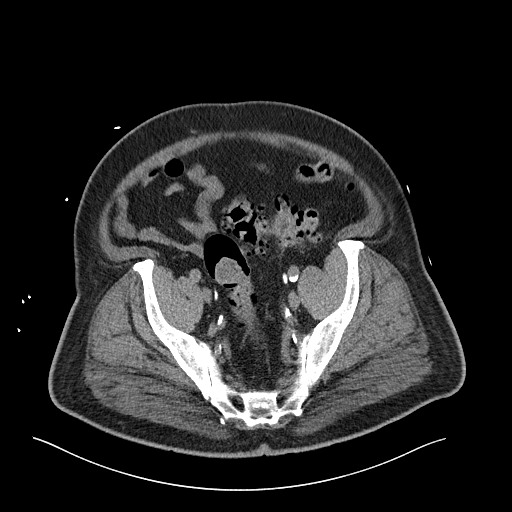
[im 33/98  soft-tissue]
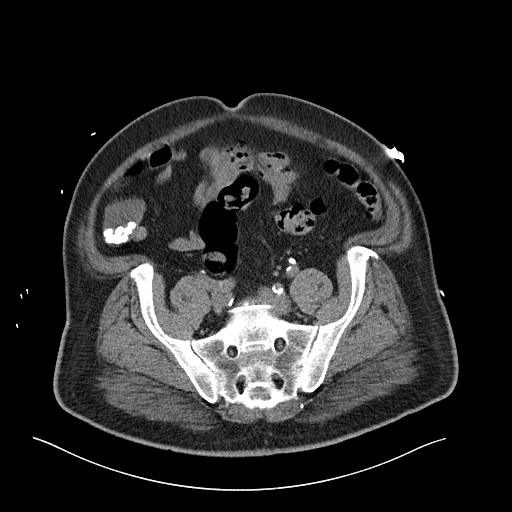
[im 41/98  soft-tissue]
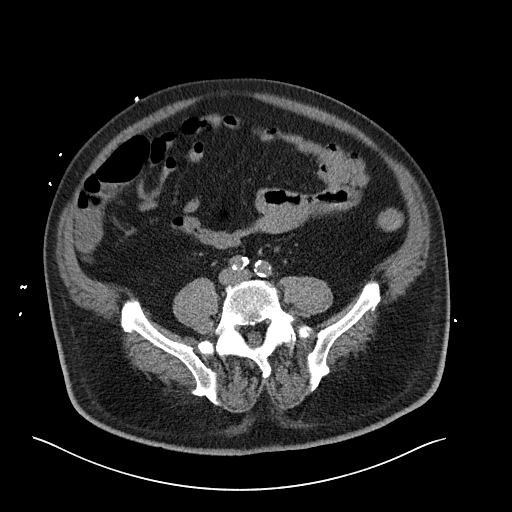
[im 49/98  soft-tissue]
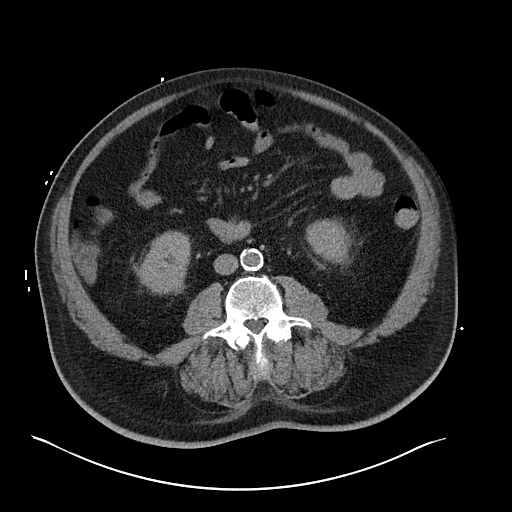
[im 57/98  soft-tissue]
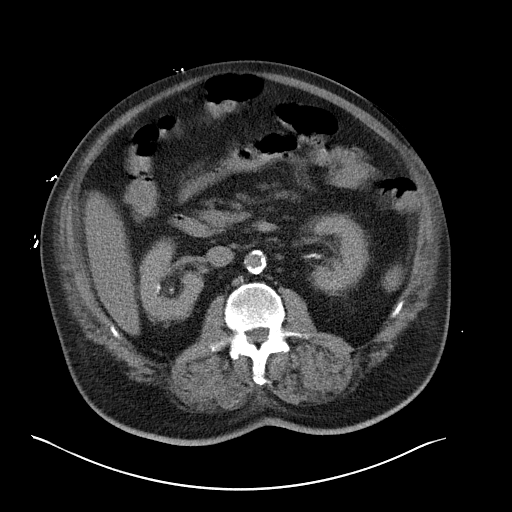
[im 65/98  soft-tissue]
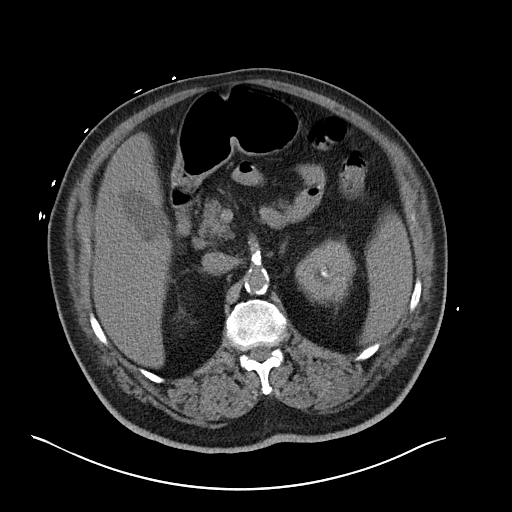
[im 65/98  bone]
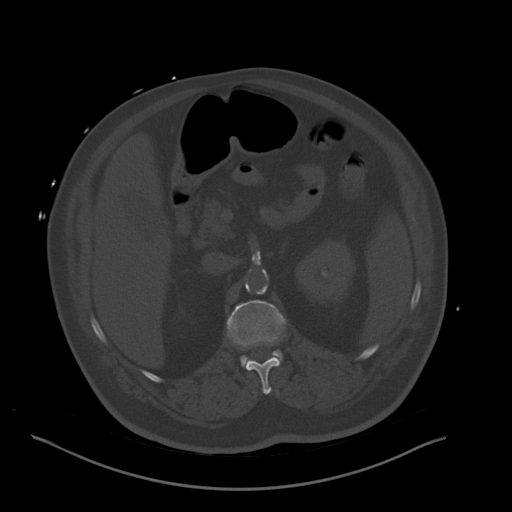
[im 69/98  soft-tissue]
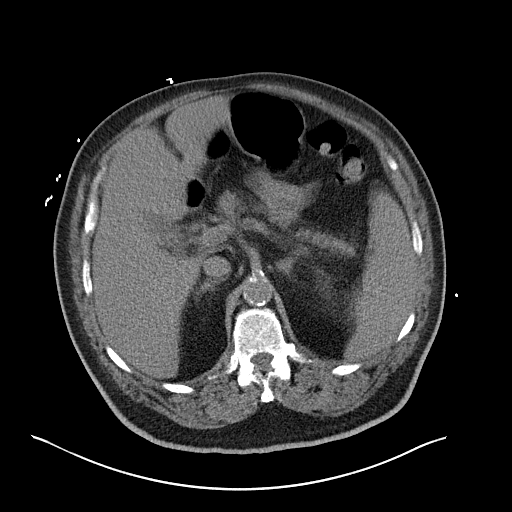
[im 77/98  soft-tissue]
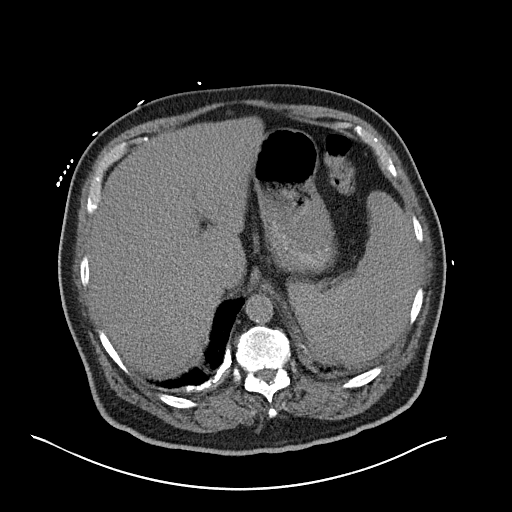
[im 85/98  soft-tissue]
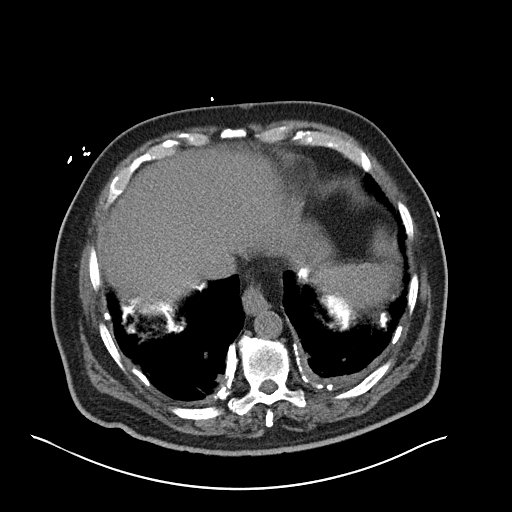
[im 93/98  soft-tissue]
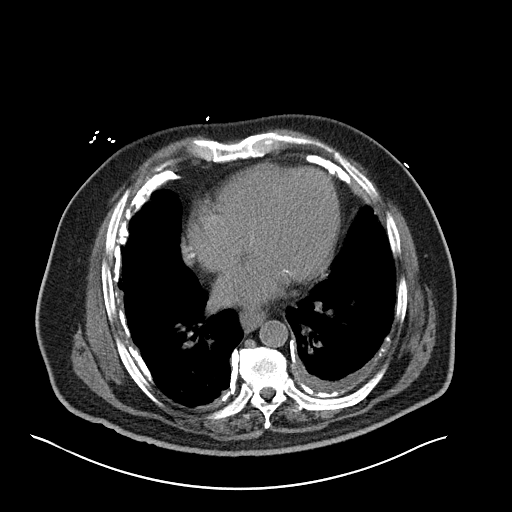

[Series 5: coronal soft tissue · coronal · 0.92mm/px · 3 of 111 slices shown]
[im 37/111  soft-tissue]
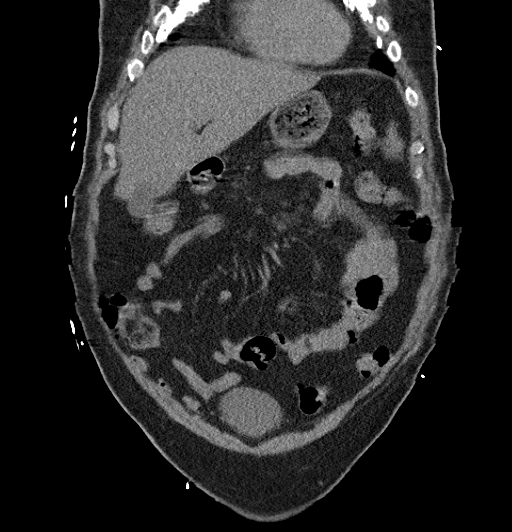
[im 49/111  soft-tissue]
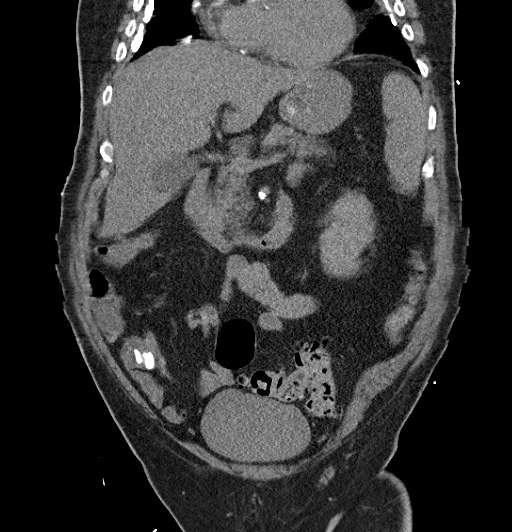
[im 62/111  soft-tissue]
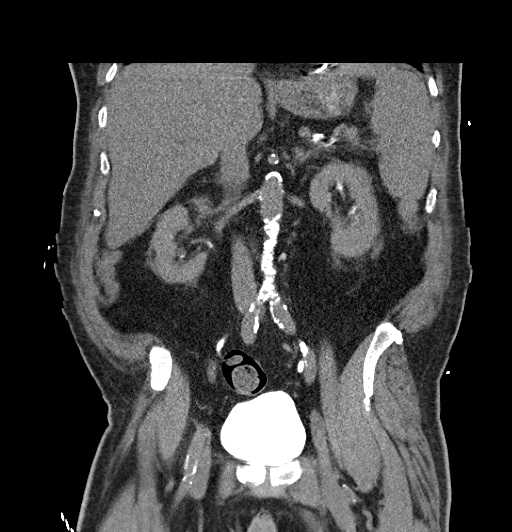

[16 of 46 positions shown; findings below may reference images not displayed]

FINDINGS: Lung Bases: Extensive calcified pleural plaques throughout the lung
bases bilaterally, likely related to asbestos related pleural
disease. Small left pleural effusion layering dependently.
Assessment of the lung parenchyma in the lung bases is limited by
considerable respiratory motion, however, there is likely some
scarring and perhaps developing rounded atelectasis in the right
lung base, particularly in the right lower lobe. Extensive aortic
valve calcifications. Atherosclerotic calcifications are noted in
the left anterior descending, left circumflex and right coronary
arteries. Small hiatal hernia.

Abdomen/Pelvis: Iodinated contrast material is noted in the
collecting systems of the kidneys bilaterally, the ureters
bilaterally and within the lumen of the urinary bladder, related to
recent cath lab procedure. Mild bilateral perinephric stranding. Low
attenuation throughout the hepatic parenchyma, compatible with
hepatic steatosis. The unenhanced appearance of the gallbladder,
pancreas and bilateral adrenal glands is unremarkable. Spleen is
enlarged measuring 10.4 x 16.5 x 15.4 cm. Extensive atherosclerosis
throughout the abdominal and pelvic vasculature, without evidence of
aneurysm. Numerous colonic diverticulae are noted, without
surrounding inflammatory changes to suggest an acute diverticulitis
at this time. No significant volume of ascites. No pneumoperitoneum.
No pathologic distention of small bowel. No definite lymphadenopathy
identified within the abdomen or pelvis on today's non contrast CT
examination.

Musculoskeletal: There are no aggressive appearing lytic or blastic
lesions noted in the visualized portions of the skeleton.
IMPRESSION: 1. No acute findings in the abdomen or pelvis to account for the
patient's symptoms.
2. Colonic diverticulosis without findings to suggest acute
diverticulitis at this time.
3. Splenomegaly.
4. Asbestos related pleural disease.
5. Trace left pleural effusion.
6. Extensive atherosclerosis, including at least 3 vessel coronary
artery disease.
7. There are calcifications of the aortic valve. Echocardiographic
correlation for evaluation of potential valvular dysfunction may be
warranted if clinically indicated.
8. Small hiatal hernia.
9. Hepatic steatosis.
10. Additional incidental findings, as above.

## 2015-02-22 IMAGING — CR DG ABDOMEN 1V
1 series · 1 of 1 positions shown · non-contrast
Comparison: CT ABD/PELV WO CM dated 08/21/2013

CLINICAL DATA: Abdominal pain.

EXAM:
ABDOMEN - 1 VIEW

[AP]
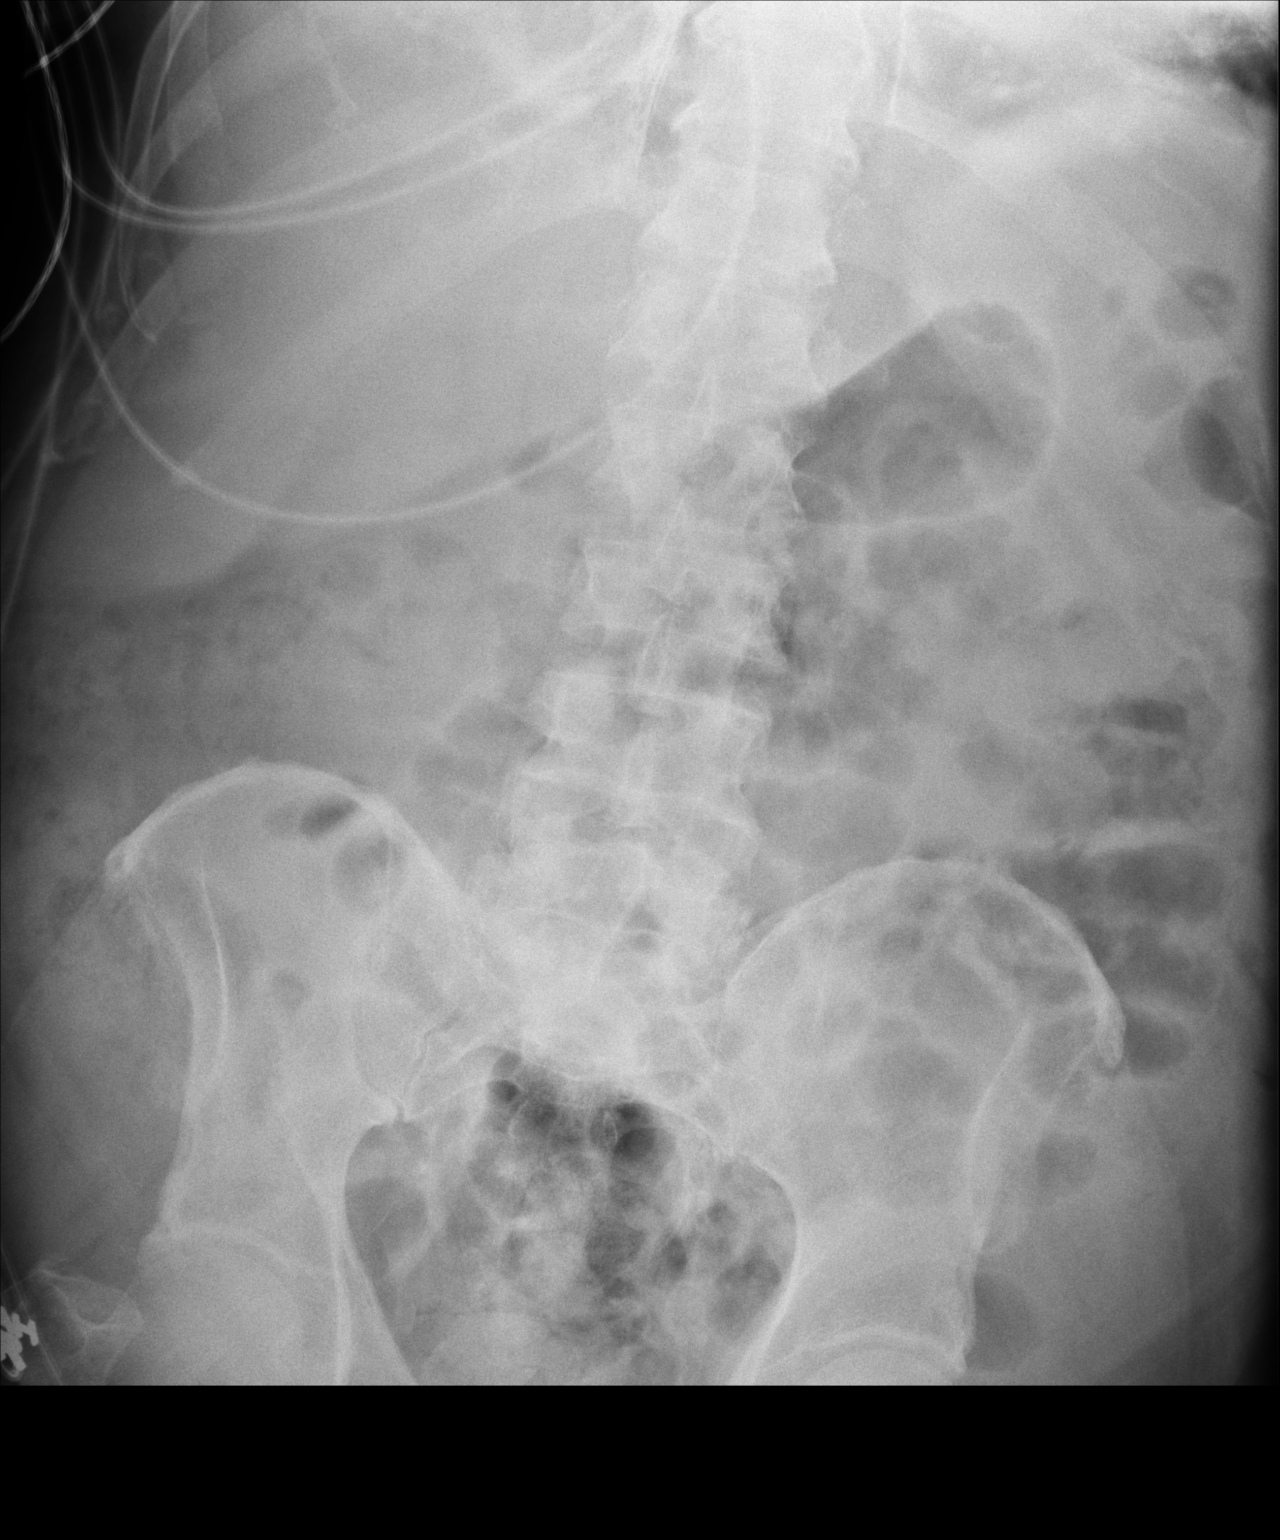

[1 of 1 positions shown; findings below may reference images not displayed]

FINDINGS: The bowel gas pattern is normal. No soft tissue abnormalities are
identified. No abnormal calcifications are seen by radiography.
Degenerative changes of the spine are present.
IMPRESSION: Normal bowel gas pattern.

## 2015-02-22 IMAGING — CR DG CHEST 1V PORT SAME DAY
1 series · 1 of 1 positions shown · non-contrast
Comparison: 08/23/2013

CLINICAL DATA: Shortness of breath and dyspnea

EXAM:
PORTABLE CHEST - 1 VIEW SAME DAY

[AP]
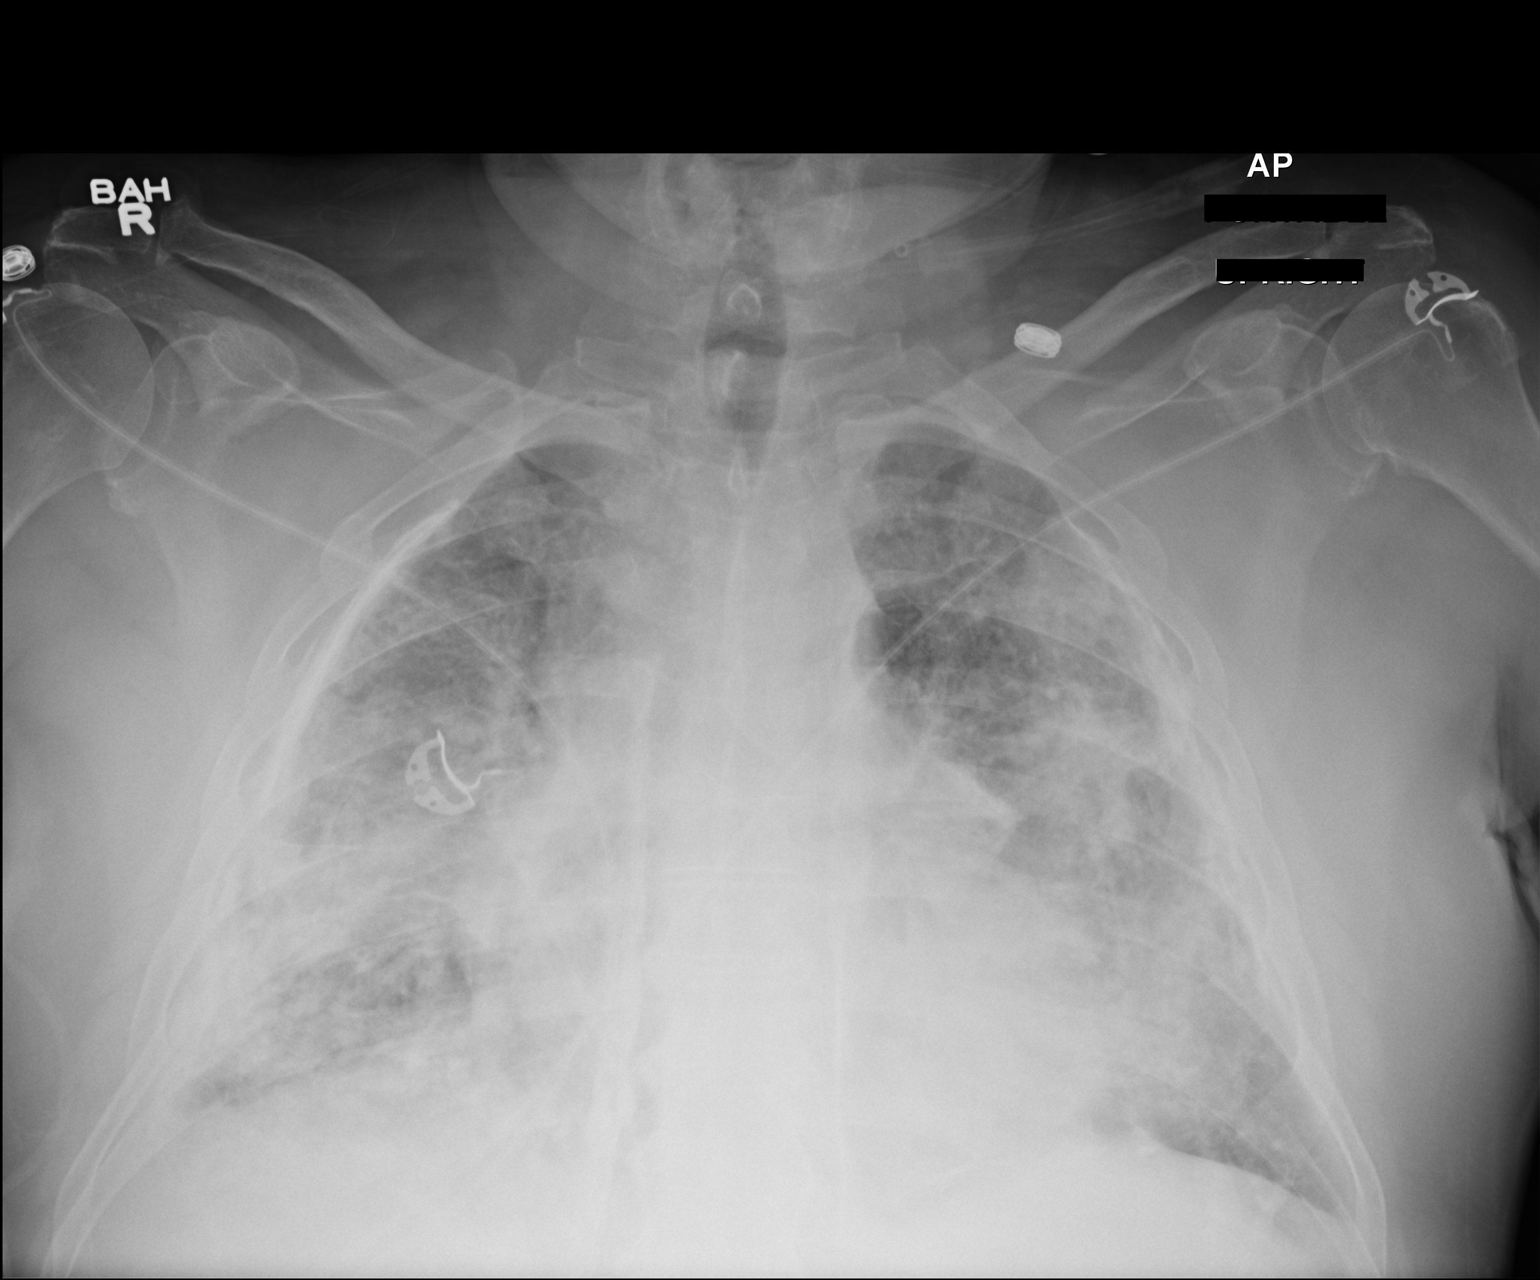

[1 of 1 positions shown; findings below may reference images not displayed]

FINDINGS: The heart size is mild to moderately enlarged. There is extensive
asbestos related pleural disease bilaterally. Superimposed airspace
opacities are identified within the right midlung and left base.
There is also mild interstitial edema noted.
IMPRESSION: 1. Bilateral airspace opacities and mild interstitial edema is
superimposed upon extensive bilateral asbestos related pleural
disease.

## 2015-02-25 IMAGING — CR DG ABD PORTABLE 1V
1 series · 1 of 1 positions shown · non-contrast
Comparison: DG ABD 1 VIEW dated 08/25/2013;

CLINICAL DATA: Blood in enteric tube

EXAM:
PORTABLE ABDOMEN - 1 VIEW

[AP]
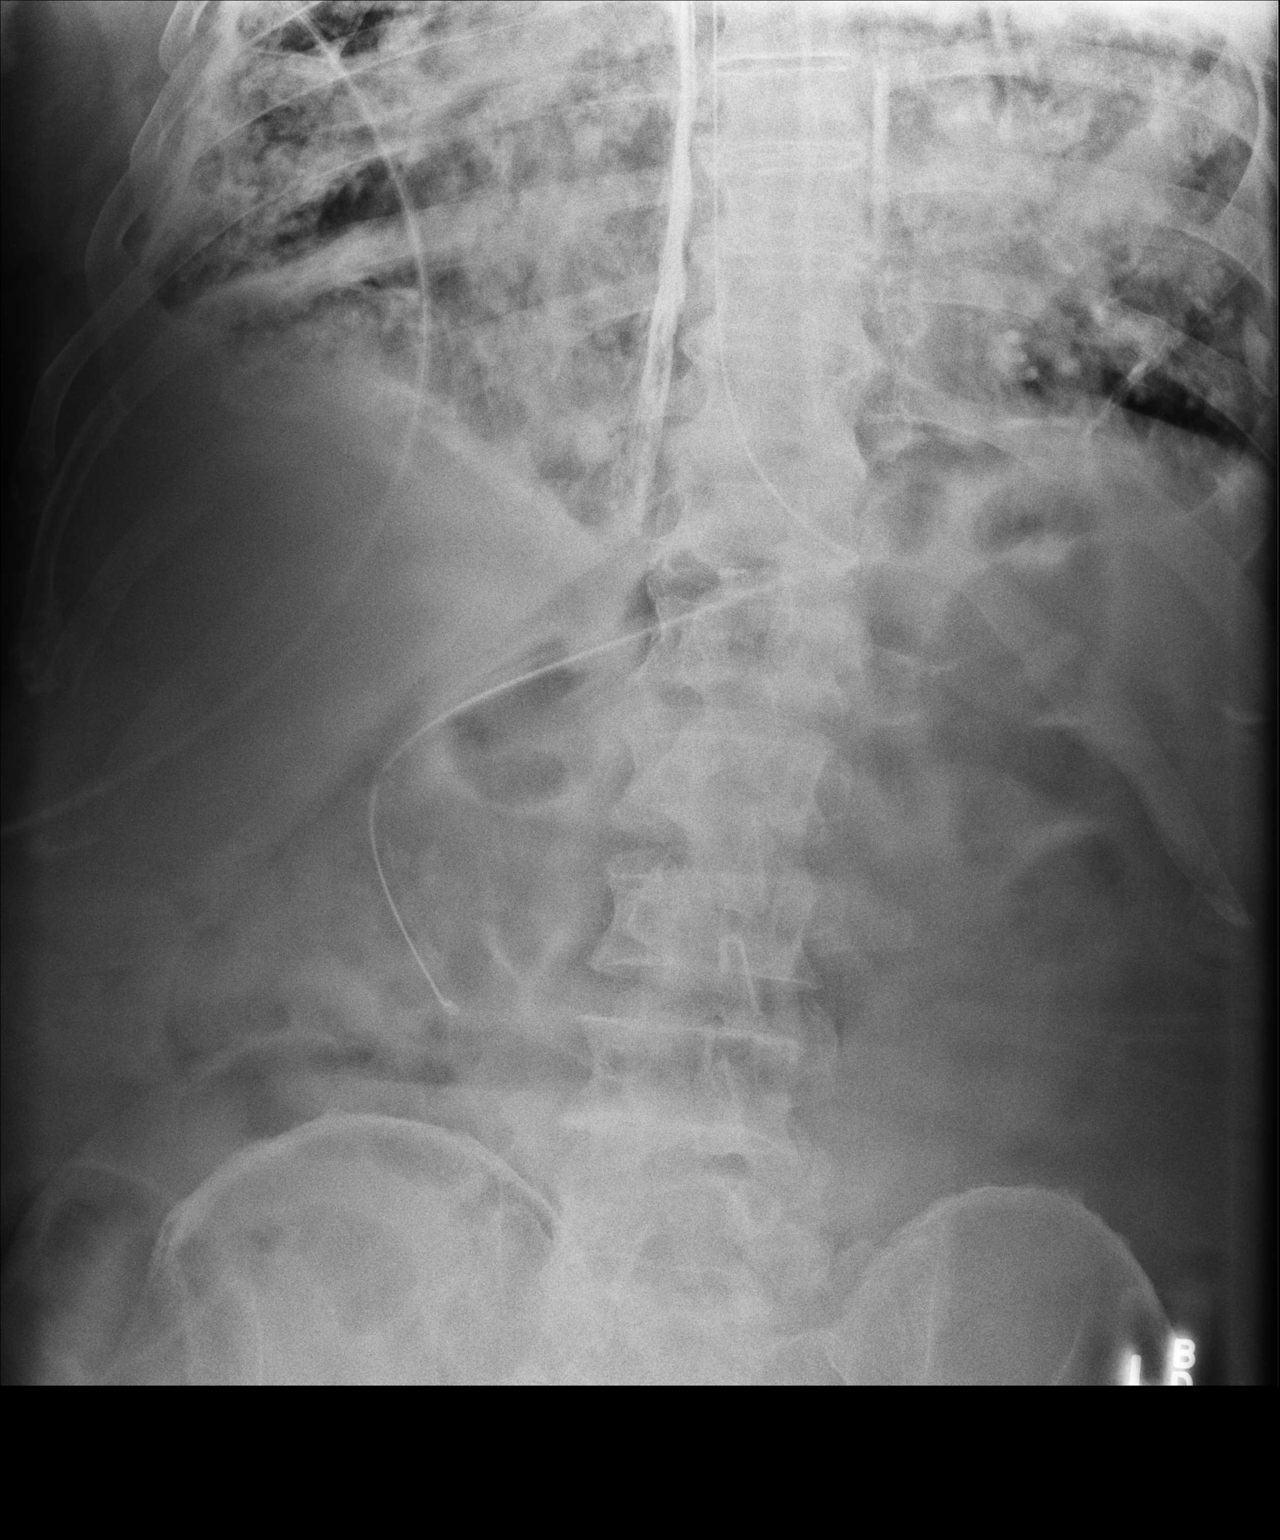

[1 of 1 positions shown; findings below may reference images not displayed]

CT ABD/PELV WO CM dated
08/21/2013; DG CHEST 1V PORT dated 08/28/2013; CT CHEST W/O CM dated
08/23/2013; DG CHEST 2 VIEW dated 08/20/2013
FINDINGS: Enteric tube tip and side port overlie the expected location of the
descending duodenum.

Paucity of bowel gas without definite evidence of obstruction. No
supine evidence of pneumoperitoneum. No definite pneumatosis or
portal venous gas.

Limited visualization of lower thorax demonstrates extensive
bilateral pleural calcifications and heterogeneous airspace
opacities within image bilateral lung bases.
IMPRESSION: 1. Enteric tube tip and side port projected over the expected
location of the descending duodenum.
2. Nonobstructive bowel gas pattern.
3. Sequela prior asbestos exposure with extensive heterogeneous
airspace opacity within the imaged bilateral lung bases. Please
refer to dedicated chest radiograph performed earlier same day.
